# Patient Record
Sex: Male | Born: 1950 | Race: White | Hispanic: No | Marital: Married | Smoking: Former smoker
Health system: Southern US, Community
[De-identification: ages and names within clinical notes are randomized; demographics above are authoritative.]

## PROBLEM LIST (undated history)

## (undated) DIAGNOSIS — I10 Essential (primary) hypertension: Secondary | ICD-10-CM

## (undated) DIAGNOSIS — I2510 Atherosclerotic heart disease of native coronary artery without angina pectoris: Secondary | ICD-10-CM

## (undated) DIAGNOSIS — I4891 Unspecified atrial fibrillation: Secondary | ICD-10-CM

## (undated) DIAGNOSIS — I3489 Other nonrheumatic mitral valve disorders: Secondary | ICD-10-CM

## (undated) DIAGNOSIS — Z952 Presence of prosthetic heart valve: Secondary | ICD-10-CM

## (undated) DIAGNOSIS — I348 Other nonrheumatic mitral valve disorders: Secondary | ICD-10-CM

## (undated) DIAGNOSIS — I340 Nonrheumatic mitral (valve) insufficiency: Secondary | ICD-10-CM

## (undated) DIAGNOSIS — I059 Rheumatic mitral valve disease, unspecified: Secondary | ICD-10-CM

## (undated) DIAGNOSIS — E785 Hyperlipidemia, unspecified: Secondary | ICD-10-CM

## (undated) DIAGNOSIS — M7989 Other specified soft tissue disorders: Secondary | ICD-10-CM

## (undated) DIAGNOSIS — E119 Type 2 diabetes mellitus without complications: Secondary | ICD-10-CM

## (undated) DIAGNOSIS — R5383 Other fatigue: Secondary | ICD-10-CM

## (undated) HISTORY — DX: Unspecified atrial fibrillation: I48.91

## (undated) HISTORY — DX: Other nonrheumatic mitral valve disorders: I34.89

## (undated) HISTORY — DX: Nonrheumatic mitral (valve) insufficiency: I34.0

## (undated) HISTORY — DX: Atherosclerotic heart disease of native coronary artery without angina pectoris: I25.10

## (undated) HISTORY — DX: Other specified soft tissue disorders: M79.89

## (undated) HISTORY — DX: Other fatigue: R53.83

## (undated) HISTORY — DX: Hyperlipidemia, unspecified: E78.5

## (undated) HISTORY — DX: Other nonrheumatic mitral valve disorders: I34.8

## (undated) HISTORY — DX: Presence of prosthetic heart valve: Z95.2

## (undated) HISTORY — DX: Essential (primary) hypertension: I10

## (undated) HISTORY — DX: Rheumatic mitral valve disease, unspecified: I05.9

## (undated) HISTORY — DX: Type 2 diabetes mellitus without complications: E11.9

---
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
## 1978-12-22 HISTORY — PX: OTHER SURGICAL HISTORY: SHX169

## 2002-05-23 ENCOUNTER — Ambulatory Visit (HOSPITAL_COMMUNITY): Admission: RE | Admit: 2002-05-23 | Discharge: 2002-05-23 | Payer: Self-pay | Admitting: Pulmonary Disease

## 2002-12-22 HISTORY — PX: SALIVARY GLAND SURGERY: SHX768

## 2003-11-06 ENCOUNTER — Ambulatory Visit (HOSPITAL_BASED_OUTPATIENT_CLINIC_OR_DEPARTMENT_OTHER): Admission: RE | Admit: 2003-11-06 | Discharge: 2003-11-06 | Payer: Self-pay | Admitting: Otolaryngology

## 2003-11-06 ENCOUNTER — Encounter (INDEPENDENT_AMBULATORY_CARE_PROVIDER_SITE_OTHER): Payer: Self-pay | Admitting: Specialist

## 2003-11-06 ENCOUNTER — Ambulatory Visit (HOSPITAL_COMMUNITY): Admission: RE | Admit: 2003-11-06 | Discharge: 2003-11-06 | Payer: Self-pay | Admitting: Otolaryngology

## 2004-11-13 ENCOUNTER — Emergency Department (HOSPITAL_COMMUNITY): Admission: EM | Admit: 2004-11-13 | Discharge: 2004-11-13 | Payer: Self-pay | Admitting: Emergency Medicine

## 2004-11-24 ENCOUNTER — Emergency Department (HOSPITAL_COMMUNITY): Admission: EM | Admit: 2004-11-24 | Discharge: 2004-11-24 | Payer: Self-pay | Admitting: Emergency Medicine

## 2004-11-27 ENCOUNTER — Emergency Department (HOSPITAL_COMMUNITY): Admission: EM | Admit: 2004-11-27 | Discharge: 2004-11-27 | Payer: Self-pay | Admitting: Emergency Medicine

## 2004-12-02 ENCOUNTER — Inpatient Hospital Stay (HOSPITAL_COMMUNITY): Admission: AD | Admit: 2004-12-02 | Discharge: 2004-12-04 | Payer: Self-pay | Admitting: *Deleted

## 2004-12-02 HISTORY — PX: RADIOFREQUENCY ABLATION: SHX2290

## 2004-12-03 ENCOUNTER — Encounter (INDEPENDENT_AMBULATORY_CARE_PROVIDER_SITE_OTHER): Payer: Self-pay | Admitting: *Deleted

## 2004-12-03 HISTORY — PX: CARDIAC CATHETERIZATION: SHX172

## 2004-12-11 ENCOUNTER — Encounter (INDEPENDENT_AMBULATORY_CARE_PROVIDER_SITE_OTHER): Payer: Self-pay | Admitting: *Deleted

## 2004-12-11 ENCOUNTER — Inpatient Hospital Stay (HOSPITAL_COMMUNITY)
Admission: RE | Admit: 2004-12-11 | Discharge: 2004-12-15 | Payer: Self-pay | Admitting: Thoracic Surgery (Cardiothoracic Vascular Surgery)

## 2004-12-11 DIAGNOSIS — Z952 Presence of prosthetic heart valve: Secondary | ICD-10-CM

## 2004-12-11 HISTORY — PX: MITRAL VALVE SURGERY: SHX714

## 2004-12-11 HISTORY — DX: Presence of prosthetic heart valve: Z95.2

## 2005-01-13 ENCOUNTER — Encounter
Admission: RE | Admit: 2005-01-13 | Discharge: 2005-01-13 | Payer: Self-pay | Admitting: Thoracic Surgery (Cardiothoracic Vascular Surgery)

## 2005-01-17 ENCOUNTER — Ambulatory Visit (HOSPITAL_COMMUNITY): Admission: RE | Admit: 2005-01-17 | Discharge: 2005-01-17 | Payer: Self-pay | Admitting: *Deleted

## 2005-03-17 ENCOUNTER — Encounter: Admission: RE | Admit: 2005-03-17 | Discharge: 2005-03-17 | Payer: Self-pay | Admitting: Neurology

## 2005-11-21 ENCOUNTER — Ambulatory Visit (HOSPITAL_COMMUNITY): Admission: RE | Admit: 2005-11-21 | Discharge: 2005-11-21 | Payer: Self-pay | Admitting: Pulmonary Disease

## 2006-01-16 ENCOUNTER — Inpatient Hospital Stay (HOSPITAL_COMMUNITY): Admission: EM | Admit: 2006-01-16 | Discharge: 2006-01-20 | Payer: Self-pay | Admitting: Emergency Medicine

## 2006-01-19 HISTORY — PX: CARDIAC CATHETERIZATION: SHX172

## 2010-06-03 ENCOUNTER — Ambulatory Visit (HOSPITAL_COMMUNITY): Admission: RE | Admit: 2010-06-03 | Discharge: 2010-06-03 | Payer: Self-pay | Admitting: Cardiology

## 2010-06-06 ENCOUNTER — Ambulatory Visit (HOSPITAL_COMMUNITY)
Admission: RE | Admit: 2010-06-06 | Discharge: 2010-06-06 | Payer: Self-pay | Source: Home / Self Care | Admitting: Cardiology

## 2010-06-06 HISTORY — PX: CARDIAC CATHETERIZATION: SHX172

## 2011-05-09 NOTE — Cardiovascular Report (Signed)
NAME:  Jacob [name], CUSH NO.:  [id number]   MEDICAL RECORD NO.:  [id number]          PATIENT TYPE:  INP   LOCATION:  2013                         FACILITY:  [facility]   PHYSICIAN:  Nanetta Batty, M.D.   DATE OF BIRTH:  [birth year]   DATE OF PROCEDURE:  DATE OF DISCHARGE:                              CARDIAC CATHETERIZATION   INDICATION:  Mr. 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  He had AF ablation followed by MR repair with  Carpentier ring.  He has been on Coumadin anticoagulation.  He presented on  January 16, 2006 with chest pain consistent with unstable angina.  He ruled  out for myocardial infarction.  There were no acute EKG changes.  He  presents now with an INR of 1.4, for diagnostic coronary arteriography to  rule out an ischemic etiology.   PROCEDURE DESCRIPTION:  The patient was brought to the second floor [facility] in the postabsorptive state.  He was premedicated with  p.o. Valium.  His right groin was prepped and draped in the usual sterile  fashion.  Xylocaine 1% was used for local anesthesia.  A 6-French sheath was  inserted into the right femoral artery using standard Seldinger technique.  The 6-French right and left Judkins diagnostic catheters as well as a 6-  French pigtail catheter were used for selective coronary angiography, and  left ventriculography, respectively.  Visipaque dye was used for the  entirety of the case.  Retrograde aortic, left ventricular and pullback  pressures were recorded.   HEMODYNAMICS:  __________  3, end-diastolic pressure of 15.   SELECTIVE CORONARY ANGIOGRAPHY:  1.  Left main normal.  2.  LAD normal.  3.  Left circumflex was nondominant and normal.  4.  Right coronary artery was dominant and normal.   LEFT VENTRICULOGRAPHY:  RAO left ventriculogram was performed using 25 mL of  Visipaque dye at 12 mL/sec.  The overall LVEF was estimated at greater  than  60% without focal wall motion abnormalities.   Fluoroscopy of the chest revealed the mitral annular ring in place and  intact.   IMPRESSION:  Mr. Crooke has normal coronary arteries and normal left  ventricular function.  I do not think the etiology of his chest pain is  cardiac in nature.  Heparin will be restarted on 4 hours.  Our pharmacy will  dose his Coumadin beginning tonight.  He will be discharged home, once his  INR reaches __________ .  He may be a candidate for Lovenox bridging.      Nanetta Batty, M.D.  Electronically Signed     JB/MEDQ  D:  01/19/2006  T:  01/20/2006  Job:  [id number]   cc:   [facility] Second Floor Cardiac Cath Lab   [address], [place], [place] [address] [facility]   Oneal Deputy. Juanetta Gosling, M.D.  Fax: [phone]

## 2011-05-09 NOTE — Procedures (Signed)
NAME:  Jacob [name], Jacob [name] NO.:  [id number]   MEDICAL RECORD NO.:  [id number]          PATIENT TYPE:  EMS   LOCATION:  ED                            FACILITY:  [facility]   PHYSICIAN:  Edward L. Juanetta Gosling, M.D.DATE OF BIRTH:  28-Mar-1951   DATE OF PROCEDURE:  11/13/2004  DATE OF DISCHARGE:  11/13/2004                                EKG INTERPRETATION   DATE/TIME: (FIRST):  Rhythm strip from November 13, 2004 at 10:42.   EKG INTERPRETATION:  Rhythm is sinus arrhythmia.   DATE/TIME: (SECOND):  An EKG at 10:42 on November 13, 2004.   INTERPRETATION:  Rhythm is sinus arrhythmia with premature atrial  contractions and there is left atrial enlargement.   DATE/TIME: (THIRD):  At 10:33 on November 13, 2004.   INTERPRETATION:  The rhythm is a supraventricular tachycardia with a rate of  about 170. There are lateral ST-T wave abnormalities.   DATE/TIME: (FOURTH):  At 10:36 on November 13, 2004.   INTERPRETATION:  The rhythm is a sinus rhythm with a rate in the 90's. There  are premature atrial contractions. Abnormal electrocardiogram.   DATE/TIME: (FIFTH):  A rhythm strip from 10:59.   INTERPRETATION:  The rhythm is sinus rhythm with premature ventricular  contractions.     Edwa   ELH/MEDQ  D:  11/24/2004  T:  11/25/2004  Job:  [id number]

## 2011-05-09 NOTE — Cardiovascular Report (Signed)
NAME:  Jacob [name], Jacob [name] NO.:  [id number]   MEDICAL RECORD NO.:  [id number]          PATIENT TYPE:  OIB   LOCATION:  6525                         FACILITY:  [facility]   PHYSICIAN:  Darlin Priestly, MD  DATE OF BIRTH:  Mar 31, 1951   DATE OF PROCEDURE:  12/03/2004  DATE OF DISCHARGE:                              CARDIAC CATHETERIZATION   PROCEDURES:  1.  Right heart catheterization.  2.  Left heart catheterization.  3.  Coronary angiography.  4.  Left ventriculogram.   COMPLICATIONS:  None.   INDICATIONS:  Mr. Malmstrom is a 60 year old male patient of Dr. Kari Baars  and Dr. Launa Grill.  Initially referred to Dr. Severiano Gilbert for intermittent  palpitations.  He underwent a 2D echocardiogram revealing severe mitral  regurge with normal LV function.  He was seen by Dr. Severiano Gilbert and underwent SVT  ablation on December 02, 2004.  He is now brought for right and left heart  catheterization to evaluate his coronary status and right heart pressures  for possible mitral valve repair.   DESCRIPTION OF OPERATION:  After informed written consent, the patient was  brought to the cardiac catheterization lab.  Right and left groins were  shaved, prepped, and draped in the usual sterile fashion.  ECG monitoring  was established.  Using modified Seldinger technique, an #8 French venous  sheath was inserted into the right femoral vein, and a #6 [place] arterial  sheath in the right femoral artery.  Next, under fluoroscopic guidance, a #7  [place] Swan-Ganz catheter was deployed into the RA, RV, PA, and wedge  positions, and hemodynamic measurements were made.  #6 [place] diagnostic  catheter was used to perform diagnostic angiography.   The left main is a medium to large size vessel, with 20% ostial narrowing.   The LAD is a large vessel that courses around the apex and gives rise to two  diagonal branches.  The LAD is mildly irregular but has no high-grade  stenosis.   The first diagonal  is a small vessel with 80% ostial disease.  It is small,  with diffuse disease of the vessel.  The second diagonal is a medium size  vessel which bifurcates distally, with no significant disease.   The left circumflex is a medium size vessel which courses around the apex  and gives rise to two obtuse marginal branches.  The circumflex has a mild  40% proximal narrowing.  First OM is a medium size vessel which bifurcates  distally, with no significant disease.  The second OM is a small vessel with  no significant disease.   The right coronary artery is a large vessel which is dominant and gives rise  to a PDA as well as a posterolateral branch.  The RCA has diffuse proximal  and mid 20-30% narrowing, but no high-grade stenosis.  PDA and  posterolateral branch are large vessels with no significant disease.   Left ventriculogram reveals preserved EF at 60%.  There is severe mitral  regurge noted, with a large left atrium.   Hemodynamics:  Right atrial pressure 6, RV 63/6, PA 66/31,  pulmonary  capillary wedge pressure 29, systemic arterial pressure 128/81, left  ventricular pressure 124/11, LVEDP 25, cardiac output 4.7, cardiac index  2.2, PA saturation 64%, AO saturation 93%.   CONCLUSIONS:  1.  Significant one-vessel CAD involving a small first diagonal branch.  2.  Normal LV systolic function, with severe mitral regurge and a large left      atrium.  3.  Moderate pulmonary hypertension, with elevated pulmonary capillary wedge      pressure.  4.  Elevated LVEDP.  5.  Cardiac output 4.7, cardiac index 2.2.  6.  PA saturation 64%, AO saturation 93%.      Robe   RHM/MEDQ  D:  12/03/2004  T:  12/03/2004  Job:  [id number]   cc:   Janeece Riggers. Severiano Gilbert, M.D.  Fax: [phone]   Oneal Deputy. Juanetta Gosling, M.D.  [address]  [place]  [place] [address]  Fax: [phone]

## 2011-05-09 NOTE — Op Note (Signed)
NAME:  Jacob [name], Jacob [name] NO.:  [id number]   MEDICAL RECORD NO.:  [id number]          PATIENT TYPE:  INP   LOCATION:  2309                         FACILITY:  [facility]   PHYSICIAN:  Salvatore Decent. Cornelius Moras, M.D. DATE OF BIRTH:  1951/08/24   DATE OF PROCEDURE:  12/11/2004  DATE OF DISCHARGE:                                 OPERATIVE REPORT   PREOPERATIVE DIAGNOSES:  1.  Mitral valve prolapse with severe mitral regurgitation.  2.  Patent foramen ovale.   POSTOPERATIVE DIAGNOSES:  1.  Mitral valve prolapse with severe mitral regurgitation.  2.  Patent foramen ovale.   PROCEDURE:  Median sternotomy for mitral valve repair (quadrangular  resection of the posterior leaflet, sliding leaflet annuloplasty with 28 mm  Edwards physio angioplasty ring) and closure of patent foramen ovale with  oversewing of left atrial appendage.   SURGEON:  Salvatore Decent. Cornelius Moras, M.D.   ASSISTANT:  Salvatore Decent. Dorris Fetch, M.D.   ANESTHESIA:  General anesthesia.   INDICATIONS FOR PROCEDURE:  The patient is a 60 year old gentleman from  [place], [place], with fairly unremarkable past medical history who was  followed by Dr. Shaune Pollack.  He presents with a six go 12 month history of  worsening exertional shortness of breath and fatigue.  Recently he developed  episodes of tachy palpitations which were found to be recurrent  supraventricular tachycardia.  He was referred to Dr. Launa Grill at  [facility].  He subsequently underwent EP study  and radiofrequency ablation of an accessory pathway in the right atrium felt  to be culprit of the patient's underlying paroxysmal supraventricular  tachycardia.  He was also noted to have evidence of severe mitral  regurgitation and follow-up transesophageal echocardiogram confirms the  presence of prolapse involving the middle scallop of the posterior leaflet  of mitral valve with severe (4+) mitral regurgitation.  There is a small  patent foramen ovale.  Left ventricular function is normal.  Left and right  heart catheterization demonstrates insignificant coronary artery disease.  There is moderate pulmonary hypertension.  A full consultation note has been  dictated previously.   OPERATIVE CONSENT:  The patient and his wife have been counseled at length  regarding the indications and potential benefits of surgical intervention.  Alternative strategies have been discussed.  They understand and accept all  associated risks of surgery and desire to proceed as described.   DESCRIPTION OF PROCEDURE:  The patient was brought to the operating room on  the above mentioned date and placed in the supine position on the operating  table.  Central monitoring is established by the anesthesia service under  the care and direction of Dr. Lacretia Nicks. Autumn Patty.  Specifically, a Swan-  Ganz catheter was placed through the right internal jugular approach.  A  radial arterial line is placed.  IV antibiotics are administered.  Following  induction with general endotracheal anesthesia, a Foley catheter is placed.  The patient's chest, abdomen, both groins and both lower extremities are  prepared and draped in a sterile manner.   Baseline transesophageal echocardiogram is performed by Dr.  Fitzgerald.  This confirms the presence of prolapse involving the middle scallop of the  posterior leaflet of the mitral valve.  This segment of the valve appears to  be flail with evidence of several ruptured primary cords.  There is severe  (4+) mitral regurgitation.  There is a small patent foramen ovale.  Left  ventricular function appears normal.  No other abnormalities are noted,  although the left atrium is quite enlarged.   A median sternotomy incision is performed.  The patient is heparinized  systemically.  The pericardium is opened.  The ascending aorta is normal in  appearance.  There is significant right ventricular dilatation and  biatrial  enlargement with mild to moderate left ventricular dilatation.  The  ascending aorta is cannulated for cardiopulmonary bypass.  The venous  cannula is placed directly in the superior vena cava.  A second venous  cannula is placed low in the right atrium with tip extending down the  inferior vena cava.  A retrograde cardioplegia catheter is placed through  the right atrium into the coronary sinus.   Cardiopulmonary bypass is begun.  Vesi-loops are placed around the superior  vena cava and the inferior vena cava.  Antegrade cardioplegia catheter is  placed in the ascending aorta.  A temperature probe is placed in the left  ventricular septum.   The patient is cooled to 28 degrees systemic temperature.  The aortic  crossclamp was applied and cardioplegia is delivered initially in the  antegrade fashion through the aortic root.  Ice saline slush is applied for  topical hypothermia.  Supplemental cardioplegia is administered retrograde  to the coronary sinus catheter.  The initial cardioplegic arrest and  myocardial cooling are felt to be excellent.  Repeat doses of cardioplegia  are administered intermittently throughout the crossclamp portion of the  operation retrograde through the coronary sinus catheter to maintain septal  temperature below 50 degrees C.   The left atriotomy incision is performed posteriorly through the intra-  atrial groove.  The mitral valve is exposed using a self-retaining  retractor.  Exposure is felt to be excellent.  The mitral valve was  carefully examined.  There was an obvious flail segment of the middle  scallop (P2) of the posterior leaflet of the mitral valve.  All of the  primary cords in the middle portion of the scallop are ruptured and flail.  The anterior leaflet appears normal and there is no prolapse involving the anterior leaflet.  The P1 portion of the posterior leaflet is normal.  The  P3 portion of the posterior leaflet is slightly  small but otherwise normal.  No other significant abnormalities are appreciated.   Quadrangular resection of the middle scallop (P2) of the posterior leaflet  is performed.  Resected specimen of leaflet is sent to pathology for routine  evaluation.  The remaining portions of the posterior leaflet (P1 and P3) are  both mobilized off of the posterior mitral annulus.  This is performed more  so on the P1 portion than the P3 portion due to the small size of P3.  Sliding leaflet annuloplasty is thus performed. Several interrupted figure-  of-eight sutures are placed through the posterior mitral annulus after  mobilization of the P1 and P3 portions of posterior leaflet to shorten the  posterior leaflet circumference.  After these so-called compression sutures  are placed, the posterior leaflet is reattached to the posterior mitral  annulus using two layer closure of running 4-0 Ethibond suture.  The  remaining vertical defect in the posterior leaflet is then closed using  interrupted 5-0 Ethibond everting sutures.  The valve was briefly tested and  appears to be competent.   Ring annuloplasty is performed using interrupted 2-0 Ethibond horizontal  mattress sutures placed circumferentially around the entire mitral annulus.  The mitral valve is sized to accept a 28 mm annuloplasty ring.  An Edwards  [employer] physio ring (model #4450, serial [id number]) is secured in place  uneventfully.  After completion of the mitral valve repair, the valve was  again tested by instilling ice saline into the left ventricular chamber.  The valve appears to be functioning normally with no residual mitral  regurgitation.  There is an isometrical line of closure of the leaflets.  Rewarming is begun.   The left atrial appendage is oversewn from within the left atrium using a  two-layer closure of running 3-0 Prolene suture.  The left atriotomy  incision is now closed using a two-layer closure of running 3-0  Prolene  suture.  Just prior to completion of the closure of the atriotomy, the lungs  are briefly ventilated to evacuate any residual air from the pulmonary  veins.  An oblique right atriotomy incision is performed.  The intra-atrial  septum and fossa ovalis are exposed.  One can appreciate a small patent  foramen ovale in the usual location.  This is oversewn with single 4-0  Prolene suture.  The right atriotomy incision is now closed with two-layer  closure of running 4-0 Prolene suture.   The patient is placed in Trendelenburg position.  One final dose of warm  retrograde hot shot cardioplegia is administered.  The lungs are ventilated  and the heart allowed to fill briefly to allow evacuation of any residual  air through the aortic root.  The aortic crossclamp is removed after a total  crossclamp time of 104 minutes.  The retrograde cardioplegia catheter is removed.  The heart begins to beat  spontaneously without need for cardioversion.  The atriotomy incisions are  inspected for hemostasis.  Epicardial pacing wires are fixed to the right  ventricular outflow tract into the right atrial appendage.  The patient is  rewarmed for 37 degrees C temperature.  Throughout this portion of the  procedure, the heart is vented through the aortic root.  The IVC cannula is  subsequently removed and its cannulation site oversewn with Prolene suture.  After transesophageal echocardiogram verifies the absence of any residual  air, the aortic root vent is removed.   The patient is weaned from cardiopulmonary bypass without difficulty.  The  patient's rhythm at separation from bypass is normal sinus rhythm.  The  patient is weaned from bypass on dopamine at 3 mcg/kg/minute.  Total  cardiopulmonary bypass time for the operation is 130 minutes.  Follow-up  transesophageal echocardiogram performed by Dr. Sampson Goon after separation  from bypass demonstrates preserved left ventricular function.  There  is a  well seated mitral annuloplasty ring.  The mitral valve appears to be  functioning normally with no residual mitral regurgitation.  No other  abnormalities are noted.  There is no residual air.   The SVC cannula is removed and its cannulation site oversewn.  The aortic  cannula is removed.  Protamine is administered to reverse the  anticoagulation.  The mediastinum is irrigated with saline solution  containing vancomycin.  Meticulous surgical hemostasis ascertained.   The mediastinum and the right pleural space drained using three chest tubes  exited through separate stab  incisions inferiorly.  The median sternotomy is  closed in routine fashion.  The soft tissue to the sternum are closed in  multiple layers and the skin is closed with running subcuticular skin  closure.   The patient tolerated the procedure well and is transported to the surgical  intensive care unit in stable condition.  There are no intraoperative  complications.  All sponge, needle and instrument counts are verified  correct at completion of the operation.  No blood products were  administered.      Clar   CHO/MEDQ  D:  12/11/2004  T:  12/12/2004  Job:  [id number]   cc:   Janeece Riggers. Severiano Gilbert, M.D.  Fax: [phone]   Darlin Priestly, MD  [phone] [address], [address]  [place]  [place] [address]  Fax: [phone]   Oneal Deputy. Juanetta Gosling, M.D.  [address]  [place]  [place] [address]  Fax: [phone]

## 2011-05-09 NOTE — Op Note (Signed)
NAME:  Jacob [name], Jacob [name] NO.:  [id number]   MEDICAL RECORD NO.:  [id number]          PATIENT TYPE:  INP   LOCATION:  2033                         FACILITY:  [facility]   PHYSICIAN:  Zenon Mayo, MDDATE OF BIRTH:  1951/08/27   DATE OF PROCEDURE:  12/11/2004  DATE OF DISCHARGE:                                 OPERATIVE REPORT   PROCEDURE:  Intraoperative transesophageal echocardiogram.   OPERATOR:  Zenon Mayo, M.D.   INDICATIONS FOR PROCEDURE:  Evaluation of mitral valve.   INDICATIONS FOR PROCEDURE:  The patient is an otherwise healthy 60 year old  gentleman who had been having progressive worsening of shortness of breath  on exertion.  He was found to have mitral regurgitation by echocardiogram,  with a flail leaflet, and was scheduled for repair of the valve by Dr.  Purcell Nails.   DESCRIPTION OF PROCEDURE:  The patient was brought to the operating room and  placed under general anesthesia.  After a confirmation of endotracheal tube  placement, the transesophageal echocardiogram probe was placed  atraumatically.  The left ventricle was imaged initially.  There were no  wall motion abnormalities seen.  The ventricle appeared normal in thickness.  The estimated ejection fraction was 60%.  The mitral valve was then imaged.  There appeared to be a flail segment on the posterior leaflet.  There did  not appear to be any chordae or flailing back into the left atrium.  When  color Doppler was placed across the valve, an eccentric jet of severe mitral  regurgitation was noted.  The jet moved across the anterior leaflet and  wrapped around the entire left atrium.  There was reversal of flow noted in  the pulmonary veins, both by color and by continuous wave Doppler.  Upon  evaluation of the mitral regurgitation jet into the left atrium, it appeared  that some of that jet was leaking across into the right atrium.  It was felt  that this was secondary to  a patent foramen ovale with left to right flow,  secondary to increased left atrial pressures.  The aorta was imaged and  appeared grossly normal.  There were no vegetations or calcifications seen  on the valve.  The valve is tri-leaflet in nature and appeared to open well.  There was no aortic insufficiency noted.  The tricuspid valve also revealed  a normal function, with no tricuspid regurgitation seen.  The pulmonic valve  was easily seen as well, with trace amounts of pulmonic regurgitation noted,  probably secondary to pulmonary artery catheter.  The thoracic aorta was  visualized without any evidence of atherosclerosis.  At the conclusion of bypass, the mitral valve was again imaged.  The ring  appeared to be seated in a good position, and the leaflets continued to move  well.  There was no regurgitation seen at any point after cardiopulmonary  bypass.  There was no color flow across the inner atrial septum, and so the  repair of the patent foramen ovale appeared good.  The left ventricular  function was well-preserved and did not require the assistance  of any  inotropes.  The remainder  of the heart examination was without change from the pre-bypass period.  At  the conclusion of the procedure, the echocardiogram probe was removed,  without any evidence of trauma.  The patient was taken from the operating room to the intensive care unit in  a stable fashion.       WEF/MEDQ  D:  12/11/2004  T:  12/12/2004  Job:  [id number]   cc:   Anesthesia Office

## 2011-05-09 NOTE — Consult Note (Signed)
NAME:  Jacob [name], Jacob [name] NO.:  [id number]   MEDICAL RECORD NO.:  [id number]          PATIENT TYPE:  OIB   LOCATION:  2899                         FACILITY:  [facility]   PHYSICIAN:  Janeece Riggers. Severiano Gilbert, M.D.    DATE OF BIRTH:  12-29-1950   DATE OF CONSULTATION:  DATE OF DISCHARGE:  01/17/2005                                   CONSULTATION   No dictation for this job number.      MEP/MEDQ  D:  01/17/2005  T:  01/17/2005  Job:  [id number]

## 2011-05-09 NOTE — Discharge Summary (Signed)
NAME:  Jacob [name], Jacob [name] NO.:  [id number]   MEDICAL RECORD NO.:  [id number]          PATIENT TYPE:  INP   LOCATION:  2033                         FACILITY:  [facility]   PHYSICIAN:  Salvatore Decent. Cornelius Moras, M.D. DATE OF BIRTH:  [birth year]   DATE OF ADMISSION:  12/11/2004  DATE OF DISCHARGE:  12/15/2004                                 DISCHARGE SUMMARY   ADMISSION DIAGNOSES:  1.  Mitral valve prolapse with severe mitral regurgitation.  2.  Patent foramen ovale.   DISCHARGE/SECONDARY DIAGNOSES:  1.  Mitral valve prolapse with severe mitral regurgitation status post      repair.  2.  Patent foramen ovale status post closure.  3.  History of recurrent paroxysmal supraventricular tachycardia.  4.  Seasonal allergies.  5.  Hemorrhoids.  6.  Tonsillectomy.  7.  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  8.  Excision of right ganglion cyst.  9.  No internal carotid artery stenosis bilateral per carotid duplex on      December 03, 2004.  10. Normal ankle brachial indices and Doppler waveforms per lower extremity      Doppler study on December 03, 2004.  Bilateral ankle brachial indices      were greater than 1.  11. He is status post electrophysiologic study and radiofrequency ablation      of right-sided pathway for paroxysmal supraventricular tachycardia on      December 02, 2004.   PROCEDURES:  1.  Mediastinotomy for mitral valve repair (quadrangular resection of the      posterior leaflet, sliding leaflet annuloplasty with a 28 mm Edwards      Physio annuloplasty ring) and closure of patent foramen ovale with      oversewing of the left atrial appendage, surgeon Dr. Tressie Stalker.      Date was December 11, 2004.  2.  Also on December 11, 2004, intraoperative transesophageal echocardiogram      by Dr. Autumn Patty.  Post bypass, the ring appeared to be seated      in a good position and the leaflets continued to move well.  There was      no  regurgitation noted.   ALLERGIES:  He is allergic to [facility] which may have caused him to develop  minor rash and diarrhea.   BRIEF HISTORY:  Jacob [name] is a 60 year old Caucasian male from [place], [place], who in the past has had a fairly unremarkable Past Medical  History.  He is followed by Dr. Shaune Pollack in [place].  [name]  reported that over the last 6-12 months he developed worsening exertional  shortness of breath and fatigue.  This had gotten to be quite significant  such that he got shortness of breath with relatively mild physical activity.  He denied any resting shortness of breath, PND, orthopnea, or chest pain.  He also has had mild bilateral extremity edema.  Several weeks prior to  admission he developed a new onset of tacky palpitations which on 3 separate  occasions prompted him to present to the emergency room where he was  found  to be in the same supraventricular tachycardia with findings consistent with  recurrent paroxysmal supraventricular tachycardia.  He was subsequently  referred to Dr. Severiano Gilbert at [facility] for an EP  study and a radiofrequency ablation.  At that visit, he was noted to have a  murmurs on physical exam.  An echocardiogram was performed demonstrating the  presence of mitral valve prolapse with severe mitral regurgitation.  Mr.  Gilpatrick was subsequently brought to the hospital where he underwent an EP  study and ablation on December 02, 2004 for a correction of this recurrent  paroxysmal supraventricular tachycardia.  He subsequently underwent left and  right heart catheterization as well as transesophageal echocardiogram.  Findings included the presence of insignificant coronary artery disease,  normal left ventricular function, moderate pulmonary hypertension and severe  4+ mitral regurgitation with a prolapse involving the middle scallop (P2) of  the posterior leaflet of the mitral valve.  Cardiac surgical  consultation  was requested to consider elective mitral valve repair.  He was seen by Dr.  Barry Dienes on December 03, 2004.  After exam of the patient and review of his  echocardiogram and cardiac catheterization report, Dr. Cornelius Moras did feel that  elective repair of his mitral valve was the best treatment option.  Dr. Cornelius Moras  also discussed the possibility that if intraoperatively he was found to  require a valve replacement that he would recommend a mechanical prosthesis  in an effort to avoid the risk of valve degeneration and failure.  The  patient was in agreement with proceeding with mitral valve repair versus  replacement.  Since Mr. Korver was felt stable, it was determined that he  was appropriate for leave of absence from the hospital with plans to return  on December 11, 2004.   HOSPITAL COURSE:  On December 11, 2004, [name] was admitted to [facility] and did under mitral valve repair and closure of his patent  foramen ovale as discussed above.  Overall, he was felt to tolerate the  procedure well and was transferred to the surgical intensive care unit in  stable condition.  Later than evening he had been extubated and was  neurologically intact.  He was maintaining some sinus rhythm and chest tube  output remained low.  By the following morning, there were no significant  changes.  His chest tubes were discontinued.  He began to mobilize and  direct therapy was started for his volume excess.  Coumadin therapy was  started for his mitral valve repair and the annuloplasty ring.  It was felt  that he would require anticoagulation therapy for a total of 3 months.   On postoperative day #2, [name] had been transferred out to the floor.  Over the next several days, he made good progress.  Although he did remain  easily fatigued with dyspnea on exertion, he did report a significant improvement from his baseline.  He was able to ambulate in the hallways  independently.  He is  tolerating and oral diet and his bowel and bladder  function were working appropriately.  His pain was controlled on Tylenol.  He showed good response to diuretic therapy and his labs remained stable.  He maintained sinus rhythm and by postoperative day #4 was thought stable  for discharge home.  Prior to discharge, his systolic blood pressure was  ranging between 130 and 140 with the diastolic ranging between 70 and 90.  His heart rate was maintaining  sinus rhythm at the high 70s to 80s.  Based  on his elevated blood pressure, it was felt that he could tolerate and  increase in his Toprol from 12.5 mg twice daily to 25 mg twice daily.  He  had been weaned from supplemental oxygen and was saturating greater than  90%.  He was afebrile.  On exam, his heart had a regular rate and rhythm  with no murmur auscultated.  His lungs were clear.  His abdominal exam was  benign.  His extremities showed trace nonpitting edema.  His general  incision was healing well without signs of infection.  His INR was  subtherapeutic at 1.6; however, it was felt that since he had maintained  sinus rhythm throughout his hospital course that he would be stable for  discharge home with close followup of his INR within 48 hours.  Dr. Allyson Sabal  was in agreement with this decision.   Mr. Kolander was discharged home on postoperative day #4, December 15, 2004 in  stable condition.   RADIOLOGY:  Chest x-ray on December 23rd showed no pneumothorax with  interval mild right basilar atelectasis, mildly improved left lower lobe  atelectasis and stable cardiomegaly and a small left pleural effusion.   LABORATORY DATA:  Most recent labs prior to discharge showed a  PT at 17.1,  INR of 1.6.  Sodium 142, potassium 4.3, blood glucose 90, BUN 15, creatinine  0.9, calcium 9.1.  White blood count of 8.2, hemoglobin 11.5, hematocrit  32.9, platelet count 113.   DISCHARGE MEDICATIONS:  1.  Coated aspirin 81 mg 1 p.o. daily  2.   Lopressor 25 mg 1 p.o. b.i.d.  3.  Coumadin 5 mg tablets.  He is to take one 5 mg tablet on December 25th      and 1/2 tablet on December 26th, then as directed by Dr. Allyson Sabal based on      his INR results.  4.  Singulair 10 mg 1 p.o. daily as needed.  5.  Tylox 1-2 tablets p.o. q.4h. p.r.n. for moderate to severe pain or      Tylenol 325 mg 1-2 tablets p.o. q.4h. p.r.n. mild pain.   ACTIVITY:  He is instructed to avoid driving or heavy lifting more than 10  pounds.  He was encouraged to continued daily breathing and walking  exercises.   DIET:  He is to follow a low fat, low salt diet.   WOUND CARE:  He may shower starting December 16, 2004.  He is to clean his  incisions with mild soap and water and is to notify the [facility] office if he  develops fever greater than 101 or redness or drainage from his incision  site.  He is also instructed to weigh himself daily first thing in the morning and to notify either Dr. Cornelius Moras or Dr. Allyson Sabal if his weight increases  more than 2 pounds over 2 consecutive days or if he develops increasing  shortness of breath or lower extremity edema.   FOLLOWUP:  He is to call and schedule PT/INR blood work at [facility] lab in [place] on December 07, 2004.  He is to call and  schedule a 2-week followup with Dr. Allyson Sabal at the [facility] in [place].  He is to follow up with Dr. Cornelius Moras at the [facility]  office on Monday, January 23, at 2:30 p.m.  He is to have a chest x-ray.  I  was instructed to bring his  chest x-ray film with him to the appointment at  [facility].  He is to call sooner if needed.      Alli   AWZ/MEDQ  D:  12/15/2004  T:  12/16/2004  Job:  [id number]   cc:   Nanetta Batty, M.D.  Fax: [phone]   Delman Cheadle, MD  [address]  [address]  [place] [address]  Fax: [phone]   Janeece Riggers. Severiano Gilbert, M.D.  Fax: [phone]   Oneal Deputy. Juanetta Gosling, M.D.  [address]  [place]  [place] [address]  Fax: [phone]

## 2011-05-09 NOTE — Cardiovascular Report (Signed)
NAME:  [name], Jacob NO.:  [id number]   MEDICAL RECORD NO.:  [id number]          PATIENT TYPE:  OIB   LOCATION:  2899                         FACILITY:  [facility]   PHYSICIAN:  Janeece Riggers. Severiano Gilbert, M.D.    DATE OF BIRTH:  [birth year]   DATE OF PROCEDURE:  01/17/2005  DATE OF DISCHARGE:  01/17/2005                              CARDIAC CATHETERIZATION   PROCEDURE PERFORMED:  External direct current cardioversion.   PREPROCEDURE DIAGNOSIS:  Atrial flutter with rapid ventricular response.   POSTPROCEDURE DIAGNOSIS:  Sinus rhythm with frequent premature atrial  contractions and premature ventricular contractions.   OPERATOR:  Mark E. Severiano Gilbert, M.D.   COMPLICATIONS:  None.   PROCEDURE IN DETAIL:  The patient was brought to the short stay area in a  fasted state.  The patient had been anticoagulated for approximately 21 days  on Coumadin therapy with INR greater than 2. The patient was noted on 12-  lead electrocardiogram to be in atrial flutter with variable ventricular  response with heart rate greater than 200 beats per minute.  Preprocedural  potassium was 4.  The patient was placed in deep conscious sedation with 100  mg IV injection of Pentothal.  After appropriate achievement of deep  conscious sedation, a single 100-joule biphasic synchronized cardioversion  shock was delivered.  Atrial flutter was converted to sinus rhythm initially  with frequent PACs and PVCs, but after approximately 10 minutes the patient  developed sinus rhythm with infrequent ectopy.  The patient awoke from  sedation in approximately 5-10 minutes.  Neurologic exam 20-30 minutes post  cardioversion was nonfocal. Mental status was normal.  The patient was alert  and oriented x4.  The patient tolerated the procedure well and was  discharged home to followup in two weeks.      MEP/MEDQ  D:  01/17/2005  T:  01/17/2005  Job:  [id number]

## 2011-05-09 NOTE — Op Note (Signed)
NAME:  Jacob [name], Jacob [name]                        ACCOUNT NO.:  [id number]   MEDICAL RECORD NO.:  [id number]                   PATIENT TYPE:  AMB   LOCATION:  DSC                                  FACILITY:  [facility]   PHYSICIAN:  Jefry H. Pollyann Kennedy, M.D.                DATE OF BIRTH:  10-30-1951   DATE OF PROCEDURE:  11/06/2003  DATE OF DISCHARGE:                                 OPERATIVE REPORT   PREOPERATIVE DIAGNOSIS:  Right parotid mass.   POSTOPERATIVE DIAGNOSIS:  Right parotid mass.   PROCEDURE:  Right superficial parotidectomy with dissection of facial nerve.   SURGEON:  Jefry H. Pollyann Kennedy, M.D.   ASSISTANT:  Kathy Breach, M.D.   ANESTHESIA:  General endotracheal anesthesia was used.   COMPLICATIONS:  No complications.   ESTIMATED BLOOD LOSS:  20 mL.   FINDINGS:  Multi-lobulated, firm mass involving the superficial lobe of the  parotid gland on the right between the two main branches of the facial  nerve, with some extension into the deep lobe.  Facial nerve was intact  postoperatively.  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  The patient tolerated the  procedure well and was awakened, extubated and transferred to recovery in  stable condition.   HISTORY:  This is a 60 year old with a several-month history of an enlarging  mass in the right side of the face.  Risks, benefits, alternatives and  complication to the procedure were explained to the patient, who seemed to  understand and agreed to surgery.   PROCEDURE:  The patient was taken to the operating room and placed on the  operating table in a supine position.  After induction of general  endotracheal anesthesia, the right side of the face was prepped and draped  in a standard fashion.  A right preauricular incision with extension behind  the ear lobe, around the mastoid tip and down into the cervical crease about  four fingerbreadths below the angle of the  mandible was outlined with a  marking pen and then electrocautery was used to incise the skin.  The  lateral branch of the right auricular nerve was identified and dissected and  preserved.  The parotid gland was dissected forward off the  sternocleidomastoid muscle and off of the cartilaginous external auditory  canal.  The posterior belly of the digastric muscle was exposed and the  dissection continued down along the tympanomastoid suture line.  The main  trunk was identified and dissected out towards the pes.  At the pes, the two  main branches were splayed almost 180 degrees by the tumor.  Superior and  inferior branches of the nerve were all dissected using McCabe dissector,  bilateral cautery and sharp scissors.  The mass was dissected free of the  underlying masseter muscle.  The mass and the superficial lobe were removed  and sent for frozen  section evaluation.  Bipolar cautery was used to provide  hemostasis.  The nerve was stimulated with a focal stimulator at the  termination of the procedure and all branches stimulated at half  milliampere.  Wound was irrigated  with saline and closed in layers using 3-0 chromic on the deep layer and  running 5-0 nylon on the skin.  A 7-French round JP drain was left in the  wound and exited through the inferior aspect of the incision and secured in  place with nylon suture.  The patient was then awakened, extubated and  transferred to recovery in stable condition.                                               Jefry H. Pollyann Kennedy, M.D.    JHR/MEDQ  D:  11/06/2003  T:  11/06/2003  Job:  [id number]

## 2011-05-09 NOTE — H&P (Signed)
NAME:  Jacob [name] NO.:  [id number]   MEDICAL RECORD NO.:  [id number]           PATIENT TYPE:   LOCATION:                               FACILITY:  [facility]   PHYSICIAN:  Salvatore Decent. Cornelius Moras, M.D. DATE OF BIRTH:  28-Feb-1951   DATE OF ADMISSION:  12/11/2004  DATE OF DISCHARGE:                                HISTORY & PHYSICAL   PRESENTING CHIEF COMPLAINT:  Exertional shortness of breath and fatigue.   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 Shaune Pollack in [place]. Jacob [name] reports that over the last six  to twelve months he has developed worsening exertional shortness of breath  and fatigue. This has gotten to be quite significant such that he gets short  of breath with relatively mild physical activity. He denies any resting  shortness of breath, PND, orthopnea, or chest pain. He has had mild  bilateral lower extremity edema. Several weeks ago he also developed new  onset of tachypalpitations which on three separate occasions prompted him to  present to the emergency room where he was found to be in sustained  supraventricular tachycardia with findings consistent with recurrent  paroxysmal supraventricular tachycardia. He was subsequently referred to Dr.  Severiano Gilbert at [facility] for EP study and  radiofrequency ablation. He was noted to have a murmur on physical  examination. An echocardiogram was performed demonstrating the presence of  mitral valve prolapse with severe mitral regurgitation. Jacob [name] was  subsequently brought into the hospital where he underwent EP study and  ablation yesterday for correction of his recurrent paroxysmal  supraventricular tachycardia.  He subsequently underwent left and right  heart catheterization as well as transesophageal echocardiogram. Findings  include the present of insignificant  coronary artery disease, normal left  ventricular function, moderate pulmonary hypertension, and severe (4+)  mitral regurgitation with prolapse involving the middle scallop (P2) of the  posterior leaflet of the mitral valve. Cardiac surgical consultation has  been requested to consider elective mitral valve repair.   REVIEW OF SYSTEMS:  GENERAL: Jacob [name] complains of progressive exertional  fatigue. He has good appetite. He has been trying to lose some weight on a  diet and exercise regimen. RESPIRATORY: Notable for progressive exertional  shortness of breath. The patient also describes a one- to two-month history  of persistent dry nonproductive cough. Six or eight weeks ago he was treated  for an upper respiratory tract infection and these symptoms seem to have  resolved. He denies history of hemoptysis or wheezing.  CARDIAC: As  described previously. The patient denies any history of chest pain either  with activity or at rest.  The patient denies syncope. GASTROINTESTINAL:  Negative. The patient has good appetite. He has no difficulty swallowing. He  does have intermittent mild symptoms of irritable bowel with occasional  crampy abdominal pain, occasional diarrhea, and occasional constipation. The  patient also has intermittent slight issues with pain, hemorrhoids, although  these have never required formal  medical evaluation. MUSCULOSKELETAL:  Negative. NEUROLOGIC: Negative. HEENT: Negative. The patient sees his  dentist regularly and saw him most recently approximately 60 months ago.  He has no loose teeth, painful teeth, or ongoing dental problems.  PSYCHIATRIC:  Negative. HEMATOLOGIC: Negative.   PAST MEDICAL HISTORY:  1.  Recurrent paroxysmal supraventricular tachycardia.  2.  Seasonal allergies.  3.  Hemorrhoids.   PAST SURGICAL HISTORY:  1.  s/p EP study and radiofrequency ablation of right-sided pathway for PSVT      yesterday.  2.  Tonsillectomy.  3.  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  4.  Excision of right ganglion cyst.   MEDICATIONS PRIOR TO ADMISSION:  1.  Cardizem CD 240 mg daily.  2.  Singulair tablets as needed for seasonal allergies.   DRUG ALLERGIES:  CARDIZEM may have caused him to develop minor rash and  diarrhea.   SOCIAL HISTORY:  The patient is married and lives with his wife and 13-year-  old daughter in [place]. 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 He  has a remote history of tobacco use, although he quit smoking 20 years ago.  He denies history of significant alcohol consumption.   PHYSICAL EXAMINATION:  The patient is a well-appearing white male who  appears her stated age in no acute distress. He is currently normal sinus  rhythm with blood pressure of 150/90. HEENT exam is grossly unrevealing. The  neck is supple. There is no cervical or supraclavicular lymphadenopathy.  There is no jugular venous distention. No carotid bruits are noted.  Auscultation of the chest includes clear and symmetrical breath sounds  bilaterally. No wheezes or rhonchi demonstrated. Cardiovascular exam  includes a regular rate and rhythm. There is a loud grade 4/6 holosystolic  murmur heard all the precordium with radiation to the axilla and back. No  diastolic murmurs are noted. The abdomen is soft and nontender. Bowel sounds  are present. There are no palpable masses. The liver edge is not enlarged or  firm. Extremities are warm and well perfused. There is trace bilateral lower  extremity edema. Distal pulses are palpable in both lower legs at the ankle.  There is no sign of significant venous insufficiency. Rectal and GU exams  are both deferred. Neurologic examination is grossly nonfocal and  symmetrical throughout.   DIAGNOSTIC TESTS:  Cardiac catheterization and transesophageal echocardiogram are performed yesterday and today and are  both reviewed.  Transesophageal echocardiogram demonstrates the presence of mitral valve  prolapse with flailed segments of the middle scallop with posterior leaflet  (P2) with severe (4+) mitral regurgitation. The left atrium is enlarged. The  left ventricular function appears normal. The aortic valve appears normal.  There is mild tricuspid insufficiency. There is a tiny patent foraminovale  with left to right shunting. No other significant abnormalities are noted.   Cardiac catheterization performed today by Dr. Jenne Campus is reviewed. This  demonstrates the presence of insignificant coronary artery disease. There is  right dominant coronary circulation. There are luminal irregularities  without any significant flow limiting lesions. There is moderate pulmonary  hypertension with PA pressures measuring 66/31 and a pulmonary capillary  wedge pressure of 29. The patient's resting cardiac output is 4.7 liters per  minute corresponding to a cardiac index of 2.2. Central venous pressure is  6.   IMPRESSION:  Mitral valve prolapse with severe mitral regurgitation and a 6-  12 month history of worsening symptoms of congestive heart failure, now  functional class II or perhaps even close to class III. I believe that Jacob.  [name] would best be treated for elective mitral valve repair. Based upon  findings of his recent transesophageal echocardiogram, I feel there is a  relatively high likelihood that mitral valve repair will be technically  feasible.  Jacob [name] will remain at somewhat elevated risk for atrial  arrhythmias, although he has now undergone successful ablation of his right-  sided PSVT. To my knowledge he has no documented history of atrial  fibrillation, and as such I do not feel that concommitant maze procedure is  indicated.   PLAN:  I have outlined options at length with Jacob [name] and his wife.  Alternative treatment strategies have been discussed. Specifically the  relative  risks and benefits of continued medical therapy and observation  versus proceeding with elective surgical intervention have been discussed.  Surgical alternatives have also been reviewed including conventional open  approach versus the possibility of minimally invasive approach. All their  questions have been addressed. They understand and accept all the associated  risks of surgery including but limited to risk of death, stroke, myocardial  infarction, congestive heart failure, respiratory failure, pneumonia,  bleeding requiring blood transfusion, arrhythmia, heart block or bradycardia  requiring permanent pacemaker, possible need for valve replacement if repair  is not feasible, later delayed complications related to the patient's mitral  valve disease. They understand that if valve replacement is necessary that  we would recommend the use of the mechanical prosthesis in an effort to avoid the risk of valve degeneration and failure. The patient also  understands that this would require lifelong anticoagulation with Coumadin.  All questions have been addressed. We tentatively plan to proceed with  surgery on Wednesday, December 11, 2004.      Clar   CHO/MEDQ  D:  12/03/2004  T:  12/03/2004  Job:  [id number]   cc:   Janeece Riggers. Severiano Gilbert, M.D.  Fax: [phone]   Darlin Priestly, MD  [phone] [address], [address]  [place]  [place] [address]  Fax: [phone]   Oneal Deputy. Juanetta Gosling, M.D.  [address]  [place]  [place] [address]  Fax: [phone]

## 2011-05-09 NOTE — Cardiovascular Report (Signed)
NAME:  Jacob [name], Jacob [name]              ACCOUNT NO.:  [id number]   MEDICAL RECORD NO.:  [id number]          PATIENT TYPE:  OIB   LOCATION:  6525                         FACILITY:  [facility]   PHYSICIAN:  Mark E. Severiano Gilbert, M.D.    DATE OF BIRTH:  1951-11-12   DATE OF PROCEDURE:  12/02/2004  DATE OF DISCHARGE:                              CARDIAC CATHETERIZATION   PROCEDURES PERFORMED:  Electrophysiology study with arrhythmia induction and  radiofrequency ablation of supraventricular tachycardia.   PRE PROCEDURE DIAGNOSIS:  Paroxysmal supraventricular tachycardia .   POST PROCEDURE DIAGNOSIS:  Typical atrioventricular reentry tachycardia.   OPERATOR:  Launa Grill, M.D.   COMPLICATIONS:  None.   ESTIMATED BLOOD LOSS:  Less than 50 mL.   PROCEDURE IN DETAIL:  The patient was brought to the EP laboratory in a  fasted state.  Patient's groins were prepped and draped in usual manner.  Two 6-French Safe sheath systems were introduced in the left femoral vein  without difficulty using a thin wall needle technique.  An 8-French Daig SRO  and a 7-French short sheath were introduced using thin wall needle technique  into the right femoral vein.  Initially, a fixed curve Josephson quadripolar  catheter was positioned within the right ventricular apex, a fixed curve  Josephson quadrivolt quadripolar catheter was positioned within the high  right atrium.  A 7-French decapolar Biosense Webster catheter was positioned  within the coronary sinus and finally a hexapolar His catheter was  positioned in the His position.  Baseline measurements were performed which  demonstrated normal sinus rhythm, AH interval 92 milliseconds and HV  interval 46 milliseconds.  There was no evidence of ventricular  preexcitation.  After determining capture thresholds in the atrium and the  ventricle EP study was performed.  The retrograde blocked cycle length was  360 milliseconds.  There was evidence of a left-sided accessory  pathway,  blocked cycle length 450 milliseconds and retrograde activation of the  atrium appeared midline at cycle length shorter than 450 milliseconds.  The  effective  refractory period of the AV node at 600 milliseconds was noted to  be 370 milliseconds.  The antegrade AV node effective refractory period was  less than 200 milliseconds off a drive train of 161 milliseconds.  During  atrial premature testing it was very easy to demonstrate a slow pathway jump  and single echoes.  Atrial pacing with atrial couplets led to easily induce  a narrow complex tachycardia, cycle length 362 milliseconds with VA time  less than 50 milliseconds.  During one of the inductions there was a period  of 2:1 AV conduction excluding an accessory pathway mediated tachycardia.  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  The high right atrial  catheter was withdrawn and was placed with an EPT large curve 4 mm tip  ablation catheter which was positioned initially in posterior tricuspid  anular positions and finally into anterior tricuspid anular positions with  application of multiple series of short radiofrequency energy deliveries.  Along the tricuspid anulus at approximately 4:50 in the LAO projection  multiple short 20-second burns with heating  characteristics to 55 degrees  resulted in junctional rhythm.  After waiting approximately 25 minutes post  EP study was performed of the following findings.  The VA ERP was 350  milliseconds off a drive train of 045 milliseconds.  The blocked cycle  length was 340 milliseconds, both of which are unchanged from pre study.  The antegrade ERP of the AV node was 250 milliseconds off the drive train of  409 milliseconds.  There was no inducible tachycardia both with single and  double atrial prematures.   All catheters were removed.  All sheaths were removed and hemostasis  obtained by direct pressure.  Patient tolerated procedure well and returned  to the  holding area in stable hemodynamic condition.   ASSESSMENT:  1.  Typical atrioventricular nodal reentry tachycardia  2.  Successful slow pathway modification with loss of arrhythmia induction.   PLAN:  Telemetry overnight and continued evaluation for mitral regurgitation  in the morning.       MEP/MEDQ  D:  12/02/2004  T:  12/02/2004  Job:  [id number]

## 2011-10-21 ENCOUNTER — Encounter: Payer: Self-pay | Admitting: *Deleted

## 2011-10-21 ENCOUNTER — Emergency Department (HOSPITAL_COMMUNITY)
Admission: EM | Admit: 2011-10-21 | Discharge: 2011-10-21 | Disposition: A | Discharge status: Home / Self Care | Payer: BC Managed Care – PPO | Attending: Emergency Medicine | Admitting: Emergency Medicine

## 2011-10-21 DIAGNOSIS — H532 Diplopia: Secondary | ICD-10-CM | POA: Insufficient documentation

## 2011-10-21 DIAGNOSIS — I10 Essential (primary) hypertension: Secondary | ICD-10-CM | POA: Insufficient documentation

## 2011-10-21 DIAGNOSIS — Z7982 Long term (current) use of aspirin: Secondary | ICD-10-CM | POA: Insufficient documentation

## 2011-10-21 HISTORY — DX: Essential (primary) hypertension: I10

## 2011-10-21 NOTE — ED Notes (Signed)
Lasted app 3 sec.

## 2011-10-21 NOTE — ED Provider Notes (Signed)
History     CSN: [id number] Arrival date & time: 10/21/2011  5:07 AM   First MD Initiated Contact with Patient 10/21/11 [phone]      Chief Complaint  Patient presents with  . Diplopia     The history is provided by the patient and the spouse.   patient reports he got up this morning and was in the process for work when he had transient "double vision".  He reports seeing two of everything.  This symptom lasted approximately 3 seconds and then resolve spontaneously on its.  During this time he had no difficulty with speech or unilateral weakness.  He is reported to have no facial droop.  He reports resolution of his symptoms and now he is completely asymptomatic.  He reports similar episodes about 5 years ago where he would have transient episodes of double vision that would last 3-10 seconds and would spontaneously resolve.  At that time he was worked up with MRI scans and multiple other tests without pathology noted.  He reports a mild headache at this time but has had a headache over the past several days.  He denies nausea or vomiting or diarrhea.  He denies fevers or chills.  He does report a distant history of paroxysmal atrial fibrillation but has not been in A. fib with a long time.  He is not on Coumadin.  He's had no difficulty walking or speaking.  He did not cover one eye and therefore it is difficult to ascertain whether or not this is monocular or binocular diplopia.  Nothing worsened his symptoms.  Nothing improves his symptoms.  The symptoms were mild and they have since resolved  Past Medical History  Diagnosis Date  . Hypertension   . Gout     Past Surgical History  Procedure Date  . Cardiac surgery   . Mitral valve surgery     Family History  Problem Relation Age of Onset  . Cancer Mother   . Diabetes Father   . Cancer Father     History  Substance Use Topics  . Smoking status: Never Smoker   . Smokeless tobacco: Not on file  . Alcohol Use: No      Review  of Systems  All other systems reviewed and are negative.    Allergies  Review of patient's allergies indicates no known allergies.  Home Medications   Current Outpatient Rx  Name Route Sig Dispense Refill  . ASPIRIN 325 MG PO TABS Oral Take 325 mg by mouth daily.      Marland Kitchen CARVEDILOL 3.125 MG PO TABS Oral Take 3.125 mg by mouth 2 (two) times daily with a meal.      . FUROSEMIDE 40 MG PO TABS Oral Take 40 mg by mouth daily.      Marland Kitchen LISINOPRIL 5 MG PO TABS Oral Take 5 mg by mouth daily.        BP 146/97  Pulse 73  Temp 98.1 F (36.7 C)  Resp 18  SpO2 99%  Physical Exam  Nursing note and vitals reviewed. Constitutional: He is oriented to person, place, and time. He appears well-developed and well-nourished.  HENT:  Head: Normocephalic and atraumatic.  Eyes: Pupils are equal, round, and reactive to light.  Cardiovascular: Regular rhythm.   Pulmonary/Chest: Effort normal.  Abdominal: Soft.  Neurological: He is alert and oriented to person, place, and time.       5/5 strength in major muscle groups of  bilateral upper and lower extremities.  Speech normal. No facial asymetry.     ED Course  Procedures (including critical care time)  Labs Reviewed - No data to display No results found.   1. Double vision       MDM  Transient diplopia of 3 seconds.  Asymptomatic now with normal neurologic exam.  I'm unsure whether or not this is monocular or binocular diplopia.  My suspicion for stroke and TIA is very low.  He is already on a full-strength aspirin.  He has no carotid bruits.  He has a distant history of paroxysmal A. fib but he has no evidence of atrial fibrillation at this time with normal heart rhythm auscultated.  Close followup as an outpatient recommended with his primary care physician.  An outpatient the patient can obtain carotid ultrasounds to further evaluate however I do not think this is a vascular problem.  Patient is encouraged to return immediately to the ER for  return of his symptoms or development of any other neurologic symptoms including but not limited to unilateral weakness numbness or difficulty with speech or development of facial droop.  The patient and his spouse understand this        Lyanne Co, MD 10/21/11 [phone]

## 2012-12-28 ENCOUNTER — Encounter (INDEPENDENT_AMBULATORY_CARE_PROVIDER_SITE_OTHER): Payer: Self-pay | Admitting: Internal Medicine

## 2012-12-28 ENCOUNTER — Ambulatory Visit (INDEPENDENT_AMBULATORY_CARE_PROVIDER_SITE_OTHER): Payer: BC Managed Care – PPO | Admitting: Internal Medicine

## 2012-12-28 VITALS — BP 124/82 | HR 80 | Temp 97.7°F | Ht 73.0 in | Wt 202.5 lb

## 2012-12-28 DIAGNOSIS — I10 Essential (primary) hypertension: Secondary | ICD-10-CM | POA: Insufficient documentation

## 2012-12-28 DIAGNOSIS — K209 Esophagitis, unspecified without bleeding: Secondary | ICD-10-CM | POA: Insufficient documentation

## 2012-12-28 DIAGNOSIS — M109 Gout, unspecified: Secondary | ICD-10-CM | POA: Insufficient documentation

## 2012-12-28 NOTE — Progress Notes (Signed)
Subjective:     Patient ID: Jacob [name], male   DOB: 1951-10-22, 62 y.o.   MRN: [id number]  HPI Presents today with c/o He tells me he had a salivary gland infection in December after Christmas.  He tells me his neck was swollen.  Seen in the ED and placed on Clindamycin.  Took for 3 days and stopped. Says he began to have heart burn.  Stopped after 3 days. Swelling in his neck resolved. He tells me today he is not hurting in his esophagus. This is the first day that it has not hurt. He usually does not have problems with acid reflux or burning in his esophagus. Appetite is good. No weight loss. BMs are normal. No extra NSAIDs. Takes ASA 325mg  daily. No  Bright red rectal bleeding or melena.   Review of Systems see hpi Current Outpatient Prescriptions  Medication Sig Dispense Refill  . aspirin 325 MG tablet Take 325 mg by mouth daily.        . carvedilol (COREG) 3.125 MG tablet Take 3.125 mg by mouth 2 (two) times daily with a meal.        . furosemide (LASIX) 40 MG tablet Take 40 mg by mouth daily.        Marland Kitchen lisinopril (PRINIVIL,ZESTRIL) 5 MG tablet Take 10 mg by mouth daily.        Past Medical History  Diagnosis Date  . Hypertension   . Gout    Past Surgical History  Procedure Date  . Cardiac surgery   . Mitral valve surgery    Allergies  Allergen Reactions  . Clindamycin/Lincomycin         Objective:   Physical Exam Filed Vitals:   12/28/12 1414  BP: 124/82  Pulse: 80  Temp: 97.7 F (36.5 C)  Height: 6\' 1"  (1.854 m)  Weight: 202 lb 8 oz (91.853 kg)   Alert and oriented. Skin warm and dry. Oral mucosa is moist.   . Sclera anicteric, conjunctivae is pink. Thyroid not enlarged. No cervical lymphadenopathy. Lungs clear. Heart regular rate and rhythm.  Abdomen is soft. Bowel sounds are positive. No hepatomegaly. No abdominal masses felt. No tenderness.  No edema to lower extremities.     Assessment:    Esophagitis probably induced by Clindamycin. Much better now.  I discussed with Dr. Karilyn Cota.    Plan:    OV prn. No Clindamycin+-

## 2012-12-28 NOTE — Patient Instructions (Addendum)
Do not take Clindamycin. F/u prn,.

## 2013-03-22 ENCOUNTER — Other Ambulatory Visit (HOSPITAL_COMMUNITY): Payer: Self-pay | Admitting: Cardiovascular Disease

## 2013-03-22 DIAGNOSIS — I050 Rheumatic mitral stenosis: Secondary | ICD-10-CM

## 2013-03-25 ENCOUNTER — Ambulatory Visit (HOSPITAL_COMMUNITY)
Admission: RE | Admit: 2013-03-25 | Discharge: 2013-03-25 | Disposition: A | Discharge status: Home / Self Care | Payer: BC Managed Care – PPO | Source: Ambulatory Visit | Attending: Cardiovascular Disease | Admitting: Cardiovascular Disease

## 2013-03-25 DIAGNOSIS — I050 Rheumatic mitral stenosis: Secondary | ICD-10-CM

## 2013-03-25 DIAGNOSIS — I059 Rheumatic mitral valve disease, unspecified: Secondary | ICD-10-CM | POA: Insufficient documentation

## 2013-03-25 NOTE — Progress Notes (Signed)
[facility]   2D echo completed 03/25/2013.   Cindy Eowyn Tabone, RDCS  

## 2013-03-28 ENCOUNTER — Encounter: Payer: Self-pay | Admitting: *Deleted

## 2013-03-28 ENCOUNTER — Encounter: Payer: Self-pay | Admitting: Cardiovascular Disease

## 2013-03-29 ENCOUNTER — Encounter: Payer: Self-pay | Admitting: Cardiovascular Disease

## 2013-05-04 ENCOUNTER — Ambulatory Visit (INDEPENDENT_AMBULATORY_CARE_PROVIDER_SITE_OTHER): Payer: BC Managed Care – PPO | Admitting: Cardiovascular Disease

## 2013-05-04 ENCOUNTER — Encounter: Payer: Self-pay | Admitting: Cardiovascular Disease

## 2013-05-04 VITALS — BP 138/100 | HR 88 | Resp 16 | Ht 73.0 in | Wt 205.0 lb

## 2013-05-04 DIAGNOSIS — I484 Atypical atrial flutter: Secondary | ICD-10-CM | POA: Insufficient documentation

## 2013-05-04 DIAGNOSIS — I499 Cardiac arrhythmia, unspecified: Secondary | ICD-10-CM

## 2013-05-04 DIAGNOSIS — I10 Essential (primary) hypertension: Secondary | ICD-10-CM

## 2013-05-04 DIAGNOSIS — I471 Supraventricular tachycardia: Secondary | ICD-10-CM

## 2013-05-04 DIAGNOSIS — I059 Rheumatic mitral valve disease, unspecified: Secondary | ICD-10-CM

## 2013-05-04 DIAGNOSIS — I498 Other specified cardiac arrhythmias: Secondary | ICD-10-CM

## 2013-05-04 DIAGNOSIS — I4892 Unspecified atrial flutter: Secondary | ICD-10-CM

## 2013-05-04 DIAGNOSIS — I341 Nonrheumatic mitral (valve) prolapse: Secondary | ICD-10-CM | POA: Insufficient documentation

## 2013-05-04 MED ORDER — CARVEDILOL 6.25 MG PO TABS: 6.2500 mg | ORAL_TABLET | Freq: Two times a day (BID) | ORAL | Status: DC

## 2013-05-04 NOTE — Assessment & Plan Note (Signed)
Not well controlled. Carvedilol increased to 6.25 mg BID

## 2013-05-04 NOTE — Progress Notes (Signed)
Patient ID: Jacob [name], male   DOB: November 22, 1951, 62 y.o.   MRN: [id number]  HPI  He does not feel well. He has very frequent "skipping of his heart beats". This happens frequently when he tries to walk the dog. They're accompanied by a marked dyspnea. On the other hand some days he is able to perform the same level of physical activity without any complaints.  Allergies  Allergen Reactions  . Cardizem (Diltiazem Hcl)   . Clindamycin/Lincomycin   . Warfarin And Related     "feels bad"  . Crestor (Rosuvastatin) Rash    Current Outpatient Prescriptions  Medication Sig Dispense Refill  . aspirin 325 MG tablet Take 325 mg by mouth daily.        . carvedilol (COREG) 6.25 MG tablet Take 1 tablet (6.25 mg total) by mouth 2 (two) times daily with a meal.  60 tablet  12  . furosemide (LASIX) 40 MG tablet Take 40 mg by mouth daily.        Marland Kitchen lisinopril (PRINIVIL,ZESTRIL) 20 MG tablet Take 20 mg by mouth daily.       No current facility-administered medications for this visit.    Past Medical History  Diagnosis Date  . Hypertension   . Gout   . 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 A-fib   . CAD (coronary artery disease)   . Dyslipidemia     Past Surgical History  Procedure Laterality Date  . Radiofrequency ablation  12/02/04    Dr. Monna Fam  . Mitral valve surgery  12/11/04    MV repair with Randa Evens ring,closure of PFO  . Cardiac catheterization  12/03/04    sign. one vessel disease,severe MR  . Cardiac catheterization  01/19/06    normal  . Cardiac catheterization  06/06/10    mild nonobstructive CAD  . Tonsillectomy  1959  . Cyst  1980    removed from wrist  . Salivary gland surgery  2004    growth    Family History  Problem Relation Age of Onset  . Cancer Mother     non-hodgkins lymphoma    History   Social History  . Marital Status: Married    Spouse Name: N/A   Number of Children: N/A  . Years of Education: N/A   Occupational History  . Not on file.   Social History Main Topics  . Smoking status: Former Games developer  . Smokeless tobacco: Not on file  . Alcohol Use: No  . Drug Use: No  . Sexually Active: Not on file   Other Topics Concern  . Not on file   Social History Narrative  . No narrative on file    ROS: He has palpitations and dyspnea. These mostly occur with activity but also sometimes at rest the The patient specifically denies any chest pain at rest exertion, orthopnea, paroxysmal nocturnal dyspnea, syncope, focal neurological deficits, intermittent claudication, lower extremity edema, unexplained weight gain, cough, hemoptysis or wheezing.  PHYSICAL EXAM BP 138/100  Pulse 88  Resp 16  Ht 6\' 1"  (1.854 m)  Wt 205 lb (92.987 kg)  BMI 27.05 kg/m2   General: Alert, oriented x3, no distress Head: no evidence of trauma, PERRL, EOMI, no exophtalmos or lid lag, no myxedema, no xanthelasma; normal ears, nose and oropharynx Neck: normal jugular venous pulsations and no hepatojugular reflux; brisk carotid pulses without delay and no  carotid bruits Chest: clear to auscultation, no signs of consolidation by percussion or palpation, normal fremitus, symmetrical and full respiratory excursions. Well-healed surgical scar of previous sternotomy Cardiovascular: normal position and quality of the apical impulse, regular rhythm, normal first and second heart sounds, no murmurs, rubs or gallops Abdomen: no tenderness or distention, no masses by palpation, no abnormal pulsatility or arterial bruits, normal bowel sounds, no hepatosplenomegaly Extremities: no clubbing, cyanosis or edema; 2+ radial, ulnar and brachial pulses bilaterally; 2+ right femoral, posterior tibial and dorsalis pedis pulses; 2+ left femoral, posterior tibial and dorsalis pedis pulsesno subclavian or femoral bruits Neurological: grossly nonfocal   EKG: Normal sinus rhythm normal  EKG  Echocardiogram performed about a year ago shows a mildly to moderately dilated left atrium and a normally functioning mitral annuloplasty repair with preserved left ventricular function  ASSESSMENT AND PLAN  Hypertension Not well controlled. Carvedilol increased to 6.25 mg BID  Atrial flutter He has frequent  numerous episodes of symptomatic palpitations that suggested atrial flutter and/or atrial fibrillation but we have been unable to document any of these recently. He was unable to wear an event monitor because of urgent reaction to the adhesive pads.  As before recommended that he undergo implantation of a loop recorder. I showed him a model of the device and again gave him a brochure about it. He appears much more interested now that he has seen how small the device is. He wants to check with his insurance coverage before he undergoes the procedure. We did go over the risks and benefits of the device and the surgical implantation procedure in a lot of detail today. It sounds like he is agreeable to go ahead with that from a medical point of view and then ascends all the aspects of the procedure but is worried about the financial implications. Astelin that he simply needs to give me a phone call if he wants to go ahead with it.  It is very important to see if he has recurrent atrial flutter atrial fibrillation due to the implications regarding anticoagulation. He does not want to start any anticoagulant therapy. He is relatively protected since his left atrial appendage was oversewn. I recommend that he continue taking aspirin. He is interested in undergoing ablation should he have evidence of recurrent arrhythmia.     Easten Maceachern

## 2013-05-04 NOTE — Patient Instructions (Addendum)
Increase carvedilol to 6.25 mg twice a day Call us to schedule loop recorder implantation

## 2013-05-04 NOTE — Assessment & Plan Note (Addendum)
He has frequent  numerous episodes of symptomatic palpitations that suggested atrial flutter and/or atrial fibrillation but we have been unable to document any of these recently. He was unable to wear an event monitor because of urgent reaction to the adhesive pads.  As before recommended that he undergo implantation of a loop recorder. I showed him a model of the device and again gave him a brochure about it. He appears much more interested now that he has seen how small the device is. He wants to check with his insurance coverage before he undergoes the procedure. We did go over the risks and benefits of the device and the surgical implantation procedure in a lot of detail today. It sounds like he is agreeable to go ahead with that from a medical point of view and then ascends all the aspects of the procedure but is worried about the financial implications. Astelin that he simply needs to give me a phone call if he wants to go ahead with it.  It is very important to see if he has recurrent atrial flutter atrial fibrillation due to the implications regarding anticoagulation. He does not want to start any anticoagulant therapy. He is relatively protected since his left atrial appendage was oversewn. I recommend that he continue taking aspirin. He is interested in undergoing ablation should he have evidence of recurrent arrhythmia.

## 2013-09-26 ENCOUNTER — Telehealth: Payer: Self-pay | Admitting: Cardiovascular Disease

## 2013-09-26 NOTE — Telephone Encounter (Signed)
Did not go to work this AM because he felt his heart was out of rhythm.  Calling now to get a note to return to work tomorrow because he feels better now.  He tried to see his PCP this AM but he is closed for the week.  Will send a messge to Dr. Salena Saner. He is scheduled for Link implant on Thursday.

## 2013-09-26 NOTE — Telephone Encounter (Signed)
Please call- need a note for work stating that he able to go back to work,

## 2013-09-26 NOTE — Telephone Encounter (Signed)
The fax number is [phone]

## 2013-09-27 NOTE — Telephone Encounter (Signed)
Please call-concerning her his note for work,

## 2013-09-27 NOTE — Telephone Encounter (Signed)
Please have Britta Mccreedy to call him.

## 2013-09-29 ENCOUNTER — Encounter: Payer: BC Managed Care – PPO | Admitting: Cardiovascular Disease

## 2013-09-29 ENCOUNTER — Encounter: Payer: Self-pay | Admitting: *Deleted

## 2013-09-29 ENCOUNTER — Ambulatory Visit (INDEPENDENT_AMBULATORY_CARE_PROVIDER_SITE_OTHER): Payer: BC Managed Care – PPO | Admitting: *Deleted

## 2013-09-29 ENCOUNTER — Encounter (HOSPITAL_COMMUNITY): Payer: Self-pay | Admitting: Pharmacist

## 2013-09-29 DIAGNOSIS — I4892 Unspecified atrial flutter: Secondary | ICD-10-CM

## 2013-09-29 DIAGNOSIS — R002 Palpitations: Secondary | ICD-10-CM

## 2013-09-29 NOTE — Patient Instructions (Signed)
Please schedule a loop recorder implant at your earliest convenience.

## 2013-09-29 NOTE — Telephone Encounter (Signed)
Note given to patient by [facility] today.

## 2013-09-29 NOTE — Progress Notes (Signed)
Patient presented to the office today under the pretences of having a linq implanted, but was scheduled for an event monitor placed. Linq implant scheduled after speaking to [facility]. Patient voiced understanding.

## 2013-10-03 ENCOUNTER — Other Ambulatory Visit: Payer: Self-pay | Admitting: *Deleted

## 2013-10-03 ENCOUNTER — Telehealth: Payer: Self-pay | Admitting: *Deleted

## 2013-10-03 NOTE — Telephone Encounter (Signed)
Order placed for Jacob [name]

## 2013-10-04 ENCOUNTER — Telehealth: Payer: Self-pay | Admitting: *Deleted

## 2013-10-04 NOTE — Telephone Encounter (Signed)
Insurance will not authorize the [employer] w/o him being seen again in the office.  Link cancelled.  Patient notified and voiced understanding.  Call transferred to scheduling to book an appt.

## 2013-10-05 ENCOUNTER — Encounter (HOSPITAL_COMMUNITY): Admission: RE | Payer: Self-pay | Source: Ambulatory Visit

## 2013-10-05 ENCOUNTER — Ambulatory Visit (HOSPITAL_COMMUNITY)
Admission: RE | Admit: 2013-10-05 | Payer: BC Managed Care – PPO | Source: Ambulatory Visit | Admitting: Cardiovascular Disease

## 2013-10-05 SURGERY — LOOP RECORDER IMPLANT
Anesthesia: LOCAL

## 2013-10-07 ENCOUNTER — Telehealth: Payer: Self-pay | Admitting: Cardiovascular Disease

## 2013-10-07 NOTE — Telephone Encounter (Signed)
LEFT MESSAGE TO CALL BACK

## 2013-10-07 NOTE — Telephone Encounter (Signed)
Waiting on additional information regarding implantable loop monitor  Please call.

## 2013-10-07 NOTE — Telephone Encounter (Signed)
Spoke with Jacob [name], She wanted to know if there was anymore detail for pre-cert Loop Recorder. Informed her that there is no visit note since 04/2013. Informed her that will have to defer to Dr C.   Next appointment later in the month. Spoke to [employer] -she will contact ANTHEM  BLUECROSS

## 2013-10-07 NOTE — Telephone Encounter (Signed)
I called and spoke with Ms. Jacob [name] at [employer] to confirm that there is no additional information at this time. The patient is scheduled to see Dr. Salena Saner on 10/12/13 and we will provide additional information at this time. She stated that she must close the case at this time, however, once additional clinicals are available, we may call back at [phone] and request a reconsideration nurse. I will follow up with this after the patient's OV on 10/12/13.

## 2013-10-12 ENCOUNTER — Ambulatory Visit: Payer: BC Managed Care – PPO | Admitting: Cardiovascular Disease

## 2013-11-21 ENCOUNTER — Other Ambulatory Visit: Payer: Self-pay | Admitting: *Deleted

## 2013-11-21 MED ORDER — LISINOPRIL 20 MG PO TABS: 20.0000 mg | ORAL_TABLET | Freq: Every day | ORAL | Status: DC

## 2013-11-21 NOTE — Telephone Encounter (Signed)
Rx was sent to pharmacy electronically. 

## 2013-12-09 ENCOUNTER — Telehealth: Payer: Self-pay | Admitting: Cardiovascular Disease

## 2013-12-09 NOTE — Telephone Encounter (Signed)
Has a question pertaining to his AFib.. Please Call  Thanks

## 2013-12-09 NOTE — Telephone Encounter (Signed)
Returned call and pt verified x 2.  Pt stated he has been having trouble w/ Afib and Dr. Salena Saner wanted him to wear a monitor.  Stated he was at his PCP's office and was in Afib.  Stated Dr. Juanetta Gosling confirmed it was Afib and pt wants to know if Dr. Salena Saner still wants him to wear the monitor b/c he (Dr. Salena Saner) thought it was Afib too.  RN asked pt if Dr. Juanetta Gosling sent the EKG to Dr. Salena Saner and pt stated he didn't do an EKG.  Stated he was listening to his heart and said it was Afib.  Pt stated he is allergic to the glue on the electrodes and can't wear the monitor, unless he has to.  Pt informed he may still need to be seen r/t f/u appt scheduled and to confirm Afib.  RN reviewed notes and informed pt he was supposed to f/u r/t loop recorder.  Pt stated his insurance didn't want to pay for it.  Pt informed there were some conversations between his insurance company and our office and there was going to be a f/u after the OV he was supposed to have 10.22.14, which was canceled.  Pt stated he had to cancel r/t many deaths in the family.  Pt informed Dr. Salena Saner will be notified for further instructions.  Pt verbalized understanding and agreed w/ plan.  Message forwarded to Dr. Royann Shivers.

## 2013-12-13 ENCOUNTER — Telehealth: Payer: Self-pay | Admitting: *Deleted

## 2013-12-13 NOTE — Telephone Encounter (Signed)
Patient will call or go to his PCP if he has any symptoms of palpitations/? Atrial fib.  Will make an appt to see Dr. Salena Saner for documentation of palpitations to determine if he needs a Loop Recorder.

## 2013-12-13 NOTE — Telephone Encounter (Signed)
Message copied by Vita Barley on Tue Dec 13, 2013  2:55 PM ------      Message from: Thurmon Fair      Created: Tue Dec 13, 2013  8:17 AM       We have been trying to document that his arrhythmia truly is AFib. Any chance he gets he should have an ECG for palpitations. Other than that, at his convenience, needs an office visit to document that he has symptomatic palpitations so that they will approve a loop recorder. If we catch the arrhythmia on ECG, the loop recorder is no longer a priority, although it would still help to monitor response to therapy. ------

## 2014-03-08 ENCOUNTER — Telehealth (HOSPITAL_COMMUNITY): Payer: Self-pay | Admitting: Dietician

## 2014-03-08 NOTE — Telephone Encounter (Signed)
Received consult via fax from Dr. Luan Pulling for dx: diabetes. Sent letter to pt home via [place] [employer] in attempt to contact pt to schedule appointment.

## 2014-03-20 NOTE — Telephone Encounter (Signed)
Received voicemails left at 1625 and 1050. Called back at 1647 and left offered class dates for April (03/27/14 and 04/13/14, both at 1730).

## 2014-03-21 NOTE — Telephone Encounter (Signed)
Received message at [phone]. Pt scheduled for 03/30/14 DM class at 1730.

## 2014-03-30 ENCOUNTER — Encounter (HOSPITAL_COMMUNITY): Payer: Self-pay | Admitting: Dietician

## 2014-03-30 NOTE — Progress Notes (Signed)
[facility] Diabetes Class Completion  Date:March 30, 2014  Time: 1730  Pt attended [facility] Education Class on March 30, 2014.   Patient was educated on the following topics:   -Survival skills (signs and symptoms of hyperglycemia and hypoglycemia, treatment for hypoglycemia, ideal levels for fasting and postprandial blood sugars, goal Hgb A1c level, foot care basics)  -Recommendations for physical activity   -Carbohydrate metabolism in relation to diabetes   -Meal planning (sources of carbohydrate, carbohydrate counting, meal planning strategies, food label reading, and portion control).  Handouts provided:  -"Diabetes and You: Taking Charge of Your Health"  -"Carbohydrate Counting and Meal Planning"  -"Your Guide to Better Office Visits"   Issiah Huffaker A. Naeem Quillin, RD, LDN  

## 2014-06-02 ENCOUNTER — Other Ambulatory Visit: Payer: Self-pay | Admitting: *Deleted

## 2014-06-02 MED ORDER — LISINOPRIL 20 MG PO TABS: 20.0000 mg | ORAL_TABLET | Freq: Every day | ORAL | Status: DC

## 2014-08-31 ENCOUNTER — Other Ambulatory Visit: Payer: Self-pay | Admitting: Cardiovascular Disease

## 2014-08-31 NOTE — Telephone Encounter (Signed)
Rx refill sent to patient pharmacy   

## 2014-10-03 ENCOUNTER — Other Ambulatory Visit: Payer: Self-pay | Admitting: Cardiovascular Disease

## 2014-11-13 ENCOUNTER — Other Ambulatory Visit: Payer: Self-pay

## 2014-11-13 MED ORDER — LISINOPRIL 20 MG PO TABS: 20.0000 mg | ORAL_TABLET | Freq: Every day | ORAL | Status: DC

## 2014-11-13 NOTE — Telephone Encounter (Signed)
Rx sent to pharmacy   

## 2014-11-24 ENCOUNTER — Encounter: Payer: Self-pay | Admitting: Cardiovascular Disease

## 2014-11-24 ENCOUNTER — Ambulatory Visit (INDEPENDENT_AMBULATORY_CARE_PROVIDER_SITE_OTHER): Payer: BC Managed Care – PPO | Admitting: Cardiovascular Disease

## 2014-11-24 VITALS — BP 142/92 | HR 75 | Resp 16 | Ht 73.0 in | Wt 183.0 lb

## 2014-11-24 DIAGNOSIS — I471 Supraventricular tachycardia: Secondary | ICD-10-CM

## 2014-11-24 DIAGNOSIS — I4892 Unspecified atrial flutter: Secondary | ICD-10-CM

## 2014-11-24 DIAGNOSIS — I10 Essential (primary) hypertension: Secondary | ICD-10-CM

## 2014-11-24 DIAGNOSIS — I341 Nonrheumatic mitral (valve) prolapse: Secondary | ICD-10-CM

## 2014-11-24 DIAGNOSIS — R079 Chest pain, unspecified: Secondary | ICD-10-CM

## 2014-11-24 NOTE — Progress Notes (Signed)
Patient ID: Jacob [name], male   DOB: 1951-03-28, 63 y.o.   MRN: [id number]     Reason for office visit S/p MV annuloplasty repair, s/p atrial flutter ablation, chest pain, palpitations  Jacob [name] is here for routine follow-up but he is experiencing some exertional chest discomfort. 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 He walks 5-8 miles a day. During the afternoon of a long day at work while pushing heavy pallets he will notice an "irritating" discomfort in his immediate precordial area. It does not seem to resolve immediately upon rest but persists for a long part of the evening. He denies shortness of breath. He continues to have palpitations but these have diminished in frequency since his previous appointment.  Since his last appointment she was diagnosed with diabetes mellitus, but he has lost substantial weight and now appears lean and fit. His BMI is only 24. His last hemoglobin A1c was only 6.1%. He also had cholesterol checked with Dr. Luan Pulling and was told that the results were generally good, but his triglycerides possibly were still little high.  He has not had syncope, lower showed edema or any shortness of breath.  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 At that time his left atrial appendage was oversewn and a patent foramen ovale was closed. Preoperative cardiac catheterization showed noncritical coronary atherosclerosis. Repeat cardiac catheterization in 2011 again showed minimal irregularities, the worst lesion being a 40% stenosis in the mid right coronary artery. He has had a couple of normal nuclear stress test, most recently in 2009. He also has a history of hypertension and dyslipidemia and as mentioned recently diabetes mellitus type 2. He quit smoking in 1986.  Echo has a mild to moderately dilated left atrium. 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 He cannot wear an event monitor since he is allergic to the adhesive pads. He has hypertension, mild type 2 diabetes mellitus and hyperlipidemia.   Allergies  Allergen Reactions  . Cardizem [Diltiazem Hcl]     Pt unsure of reaction  . Clindamycin/Lincomycin     Acid refux  . Warfarin And Related     "feels bad"    Current Outpatient Prescriptions  Medication Sig Dispense Refill  . acetaminophen (TYLENOL) 500 MG tablet Take 1,000 mg by mouth daily as needed for pain (headaches).    Marland Kitchen aspirin 325 MG tablet Take 325 mg by mouth daily.      . furosemide (LASIX) 40 MG tablet Take 40 mg by mouth daily.      Marland Kitchen lisinopril (PRINIVIL,ZESTRIL) 20 MG tablet Take 1 tablet (20 mg total) by mouth daily. 15 tablet 0  . metFORMIN (GLUCOPHAGE) 500 MG tablet Take 500 mg by mouth 2 (two) times daily.  11  . carvedilol (COREG) 6.25 MG tablet Take 1 tablet (6.25 mg total) by mouth 2 (two) times daily with a meal. (Patient not taking: Reported on 11/24/2014) 60 tablet 12   No current facility-administered medications for this visit.    Past Medical History  Diagnosis Date  . Hypertension   . Gout   . 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 A-fib   . CAD (coronary artery disease)   . Dyslipidemia     Past Surgical History  Procedure Laterality Date  . Radiofrequency ablation  12/02/04    Dr. Emelda Brothers  . Mitral valve surgery  12/11/04    MV repair with Oletta Lamas ring,closure of PFO  . Cardiac catheterization  12/03/04    sign. one vessel disease,severe MR  . Cardiac catheterization  01/19/06    normal  . Cardiac catheterization  06/06/10    mild nonobstructive CAD  . Tonsillectomy  1959  .  Cyst  1980    removed from wrist  . Salivary gland surgery  2004    growth    Family History  Problem Relation Age of Onset  . Cancer Mother     non-hodgkins lymphoma    History   Social History  . Marital Status: Married    Spouse Name: N/A    Number of Children: N/A  . Years of Education: N/A   Occupational History  . Not on file.   Social History Main Topics  . Smoking status: Former Research scientist (life sciences)  . Smokeless tobacco: Not on file  . Alcohol Use: No  . Drug Use: No  . Sexual Activity: Not on file   Other Topics Concern  . Not on file   Social History Narrative    Review of systems: Palpitations and post exertional chest discomfort as described above The patient specifically denies any chest pain at rest, dyspnea at rest or with exertion, orthopnea, paroxysmal nocturnal dyspnea, syncope, palpitations, focal neurological deficits, intermittent claudication, lower extremity edema, unexplained weight gain, cough, hemoptysis or wheezing.  The patient also denies abdominal pain, nausea, vomiting, dysphagia, diarrhea, constipation, polyuria, polydipsia, dysuria, hematuria, frequency, urgency, abnormal bleeding or bruising, fever, chills, unexpected weight changes, mood swings, change in skin or hair texture, change in voice quality, auditory or visual problems, allergic reactions or rashes, new musculoskeletal complaints other than usual "aches and pains".   PHYSICAL EXAM BP 142/92 mmHg  Pulse 75  Resp 16  Ht 6\' 1"  (1.854 m)  Wt 183 lb (83.008 kg)  BMI 24.15 kg/m2 Recheck BP 136/80 mmHg  General: Alert, oriented x3, no distress Head: no evidence of trauma, PERRL, EOMI, no exophtalmos or lid lag, no myxedema, no xanthelasma; normal ears, nose and oropharynx Neck: normal jugular venous pulsations and no hepatojugular reflux; brisk carotid pulses without delay and no carotid bruits Chest: clear to auscultation, no signs of consolidation by percussion or palpation, normal  fremitus, symmetrical and full respiratory excursions Cardiovascular: normal position and quality of the apical impulse, regular rhythm, normal first and second heart sounds, no murmurs, rubs or gallops. Sternotomy scar Abdomen: no tenderness or distention, no masses by palpation, no abnormal pulsatility or arterial bruits, normal bowel sounds, no hepatosplenomegaly Extremities: no clubbing, cyanosis or edema; 2+ radial, ulnar and brachial pulses bilaterally; 2+ right femoral, posterior tibial and dorsalis pedis pulses; 2+ left femoral, posterior tibial and dorsalis pedis pulses; no subclavian or femoral bruits Neurological: grossly nonfocal   EKG: Normal sinus rhythm with one PAC and one PVC. He was not aware of any palpitations today during the exam.  Lipid Panel  No results found for: CHOL, TRIG, HDL, CHOLHDL, VLDL, LDLCALC, LDLDIRECT  BMET No results found for: NA, K, CL, CO2, GLUCOSE, BUN, CREATININE, CALCIUM, GFRNONAA, GFRAA   ASSESSMENT AND PLAN  His chest pain is not typical for exertional angina pectoris (it seems to occur inconsistently during physical exercise and  persisted for a long time afterwards, suggesting it might be musculoskeletal. He did not have any angina prior to his heart surgery and did not require coronary vascularization but did have moderate atherosclerosis. We'll schedule him for a stress test.  Will retrieve his laboratory results from Dr. Luan Pulling, but I'm sure that with his weight loss his lipid profile has improved. Congratulated on his improved exercise and eating habits.  Physical exam there is no evidence of dysfunction of his mitral valve repair. His palpitations have diminished in frequency and dome bother him as much. Today we have evidence of PACs and PVCs which might be an alternative explanation for his palpitations, rather than recurrent atrial flutter. His atrial flutter was a postoperative phenomenon and he has had his left atrial appendage oversewn,  his risk of embolic events is questionable. I would still like him to have a loop recorder implanted so to get an accurate diagnosis for his palpitations.  Meds ordered this encounter  Medications  . metFORMIN (GLUCOPHAGE) 500 MG tablet    Sig: Take 500 mg by mouth 2 (two) times daily.    Refill:  [address] [place], [facility] [phone] office [phone] pager

## 2014-11-24 NOTE — Patient Instructions (Signed)
  We will see you back in follow up in 1 year with Dr Sallyanne Kuster.  Dr Sallyanne Kuster has ordered: Your physician has requested that you have an exercise tolerance test. For further information please visit [email or web address]. Please also follow instruction sheet, as given.

## 2014-12-26 ENCOUNTER — Telehealth (HOSPITAL_COMMUNITY): Payer: Self-pay

## 2014-12-26 NOTE — Telephone Encounter (Signed)
Encounter complete. 

## 2014-12-28 ENCOUNTER — Ambulatory Visit (HOSPITAL_COMMUNITY)
Admission: RE | Admit: 2014-12-28 | Discharge: 2014-12-28 | Disposition: A | Discharge status: Home / Self Care | Payer: PRIVATE HEALTH INSURANCE | Source: Ambulatory Visit | Attending: Cardiovascular Disease | Admitting: Cardiovascular Disease

## 2014-12-28 DIAGNOSIS — I4892 Unspecified atrial flutter: Secondary | ICD-10-CM | POA: Insufficient documentation

## 2014-12-28 DIAGNOSIS — R079 Chest pain, unspecified: Secondary | ICD-10-CM | POA: Diagnosis not present

## 2014-12-28 NOTE — Procedures (Signed)
Exercise Treadmill Test   Test  Exercise Tolerance Test Ordering MD: [name], MD    Unique Test No: 1  Treadmill:  1  Indication for ETT: Palpitations  Contraindication to ETT: No   Stress Modality: exercise - treadmill  Cardiac Imaging Performed: non   Protocol: standard Bruce - maximal  Max BP:  174/85  Max MPHR (bpm):  157 85% MPR (bpm):  133  MPHR obtained (bpm):  226 % MPHR obtained:  143  Reached 85% MPHR (min:sec):  5:45 Total Exercise Time (min-sec):  7:21  Workload in METS:  9 Borg Scale: 14  Reason ETT Terminated:  SVT    ST Segment Analysis At Rest: normal ST segments - no evidence of significant ST depression With Exercise: borderline ST changes  Other Information Arrhythmia:  SVT with rates to 220 sustained developed during test Angina during ETT:  absent (0) Quality of ETT:  non-diagnostic  ETT Interpretation:  borderline (indeterminate) with non-specific ST changes  Comments: SVT into the 220's developed during exercise, terminated with PVC's PACs and atrial bigeminy noted during the study Rate-related 1 mm horizontal ST depression noted during the SVT Otherwise, no significant ischemic EKG changes noted  Pixie Casino, MD, [facility] Attending Cardiologist [facility]]

## 2015-01-02 NOTE — Progress Notes (Signed)
Just start at lowest dose 3.125 mg BID and let's see how he feels.

## 2015-01-18 ENCOUNTER — Ambulatory Visit: Payer: BC Managed Care – PPO | Admitting: Cardiovascular Disease

## 2015-02-20 ENCOUNTER — Telehealth: Payer: Self-pay | Admitting: Cardiovascular Disease

## 2015-02-21 NOTE — Telephone Encounter (Signed)
Closed enounter °

## 2015-02-28 ENCOUNTER — Ambulatory Visit: Payer: PRIVATE HEALTH INSURANCE | Admitting: Cardiovascular Disease

## 2015-04-05 ENCOUNTER — Ambulatory Visit (INDEPENDENT_AMBULATORY_CARE_PROVIDER_SITE_OTHER): Payer: PRIVATE HEALTH INSURANCE | Admitting: Cardiovascular Disease

## 2015-04-05 ENCOUNTER — Encounter: Payer: Self-pay | Admitting: Cardiovascular Disease

## 2015-04-05 VITALS — BP 142/94 | HR 78 | Ht 73.0 in | Wt 178.9 lb

## 2015-04-05 DIAGNOSIS — I4892 Unspecified atrial flutter: Secondary | ICD-10-CM | POA: Diagnosis not present

## 2015-04-05 DIAGNOSIS — I471 Supraventricular tachycardia: Secondary | ICD-10-CM | POA: Diagnosis not present

## 2015-04-05 DIAGNOSIS — I10 Essential (primary) hypertension: Secondary | ICD-10-CM | POA: Diagnosis not present

## 2015-04-05 DIAGNOSIS — I341 Nonrheumatic mitral (valve) prolapse: Secondary | ICD-10-CM | POA: Diagnosis not present

## 2015-04-05 MED ORDER — CARVEDILOL 3.125 MG PO TABS: 3.1250 mg | ORAL_TABLET | Freq: Two times a day (BID) | ORAL | Status: DC

## 2015-04-05 NOTE — Progress Notes (Signed)
Patient ID: Jacob [name], male   DOB: 07/25/1951, 64 y.o.   MRN: [id number]     Cardiology Office Note   Date:  04/08/2015   ID:  [name], DOB 1951-09-04, MRN [id number]  PCP:  [name], MD  Cardiologist:   [name], MD   Chief complaint : Intermittent palpitations.   History of Present Illness: Jacob [name] is a 64 y.o. male who presents for follow-up of valvular heart disease and arrhythmia.   In January he underwent a routine treadmill stress test as part of his [employer] physical workup requirements. The treadmill stress test was normal but he developed very rapid atrial tachycardia at about 2 and 20 bpm during exercise. This is likely the cause of his intermittent palpitations.   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 At that time his left atrial appendage was oversewn and a patent foramen ovale was closed. Preoperative cardiac catheterization showed noncritical coronary atherosclerosis. Repeat cardiac catheterization in 2011 again showed minimal irregularities, the worst lesion being a 40% stenosis in the mid right coronary artery. He has had a couple of normal nuclear stress tests, most recently in 2009 and normal treadmill stress test most recently January 2016. He also has a history of hypertension and dyslipidemia and as mentioned recently diabetes mellitus type 2. He quit smoking in 1986. Echo has a mild to moderately dilated left atrium. 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 He cannot wear an event monitor since he is allergic to the adhesive pads. He has hypertension, mild type 2 diabetes mellitus and hyperlipidemia.   he has  recently lost substantial weight and now has been in optimal weight range for several months. Despite this his blood pressure is borderline high. He takes only low-dose metformin for diabetes. 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 He estimates that he has walked 15 miles in the last 3 weeks.   Past Medical History  Diagnosis Date  . Hypertension   . Gout   . 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 A-fib   . CAD (coronary artery disease)   . Dyslipidemia     Past Surgical History  Procedure Laterality Date  . Radiofrequency ablation  12/02/04    Dr. Emelda Brothers  . Mitral valve surgery  12/11/04    MV repair with Oletta Lamas ring,closure of PFO  . Cardiac catheterization  12/03/04    sign. one vessel disease,severe MR  . Cardiac catheterization  01/19/06    normal  . Cardiac catheterization  06/06/10    mild nonobstructive CAD  . Tonsillectomy  1959  . Cyst  1980    removed from wrist  . Salivary gland surgery  2004    growth     Current Outpatient Prescriptions  Medication Sig Dispense Refill  . acetaminophen (TYLENOL) 500 MG tablet Take 1,000 mg by mouth daily as needed for pain (headaches).    . carvedilol (COREG) 3.125 MG tablet Take 1 tablet (3.125 mg total) by mouth 2 (two) times daily. 180 tablet 3  . furosemide (LASIX) 40 MG tablet Take 40  mg by mouth daily.      Marland Kitchen lisinopril (PRINIVIL,ZESTRIL) 20 MG tablet Take 1 tablet (20 mg total) by mouth daily. 15 tablet 0  . metFORMIN (GLUCOPHAGE) 500 MG tablet Take 500 mg by mouth 2 (two) times daily.  11   No current facility-administered medications for this visit.    Allergies:   Cardizem; Clindamycin/lincomycin; and Warfarin and related    Social History:  The patient  reports that he has quit smoking. He does not have any smokeless tobacco history on file. He reports that he does not drink  alcohol or use illicit drugs.   Family History:  The patient's family history includes Cancer in his mother.    ROS:  Please see the history of present illness.    Palpitations. The patient specifically denies any chest pain at rest, dyspnea at rest or with exertion, orthopnea, paroxysmal nocturnal dyspnea, syncope, palpitations, focal neurological deficits, intermittent claudication, lower extremity edema, unexplained weight gain, cough, hemoptysis or wheezing.  The patient also denies abdominal pain, nausea, vomiting, dysphagia, diarrhea, constipation, polyuria, polydipsia, dysuria, hematuria, frequency, urgency, abnormal bleeding or bruising, fever, chills, unexpected weight changes, mood swings, change in skin or hair texture, change in voice quality, auditory or visual problems, allergic reactions or rashes, new musculoskeletal complaints other than usual "aches and pains"..   All other systems are reviewed and negative.    PHYSICAL EXAM: VS:  BP 142/94 mmHg  Pulse 78  Ht 6\' 1"  (1.854 m)  Wt 178 lb 14.4 oz (81.149 kg)  BMI 23.61 kg/m2 , BMI Body mass index is 23.61 kg/(m^2).  General: Alert, oriented x3, no distress Head: no evidence of trauma, PERRL, EOMI, no exophtalmos or lid lag, no myxedema, no xanthelasma; normal ears, nose and oropharynx Neck: normal jugular venous pulsations and no hepatojugular reflux; brisk carotid pulses without delay and no carotid bruits Chest: clear to auscultation, no signs of consolidation by percussion or palpation, normal fremitus, symmetrical and full respiratory excursions. Healthy sternotomy scar  Cardiovascular: normal position and quality of the apical impulse, regular rhythm, normal first and second heart sounds, no murmurs, rubs or gallops Abdomen: no tenderness or distention, no masses by palpation, no abnormal pulsatility or arterial bruits, normal bowel sounds, no hepatosplenomegaly Extremities: no clubbing, cyanosis or edema; 2+ radial,  ulnar and brachial pulses bilaterally; 2+ right femoral, posterior tibial and dorsalis pedis pulses; 2+ left femoral, posterior tibial and dorsalis pedis pulses; no subclavian or femoral bruits Neurological: grossly nonfocal Psych: euthymic mood, full affect   EKG:  EKG is ordered today. The ekg ordered today demonstrates normal sinus rhythm with 2 premature atrial complexes   Recent Labs: No results found for requested labs within last 365 days.    Lipid Panel No results found for: CHOL, TRIG, HDL, CHOLHDL, VLDL, LDLCALC, LDLDIRECT    Wt Readings from Last 3 Encounters:  04/05/15 178 lb 14.4 oz (81.149 kg)  11/24/14 183 lb (83.008 kg)  05/04/13 205 lb (92.987 kg)      ASSESSMENT AND PLAN:  1.  Coronary artery disease with mild to moderate obstruction by previous cardiac catheterization, asymptomatic and with normal recent stress test. Focuses on risk factor modification  2.  degenerative mitral valve disease status post mitral valve annuloplasty with favorable echo findings (last echo April 2014) and preserved left ventricular systolic function  3. Remote history of atrial flutter status post cardioversion and AV node reentry tachycardia status post radiofrequency ablation. His current palpitations seem to be related to PACs and  brief runs of paroxysmal atrial tachycardia. This may represent reentry around the atriotomy scar from his mitral valve surgery. He does not have sustained arrhythmia. His records showed that his left atrial appendage was oversewn which should offer some protection from embolic events. He prefers not to take anticoagulants. He lists warfarin as a "allergy". We'll start low-dose beta blocker both for blood pressure control and hopefully to reduce the frequency of his palpitations.    Current medicines are reviewed at length with the patient today.  The patient does not have concerns regarding medicines.  The following changes have been made:  Start  carvedilol 3.125 mg twice a day  Labs/ tests ordered today include:  Orders Placed This Encounter  Procedures  . EKG 12-Lead   Patient Instructions  START Carvedilol 3.125mg  twice a day.  Dr. Sallyanne Kuster recommends that you schedule a follow-up appointment in: One year.      Mikael Spray, MD  04/08/2015 1:58 PM    [name], MD, [facility] [phone] office [phone] pager

## 2015-04-05 NOTE — Patient Instructions (Signed)
START Carvedilol 3.125mg  twice a day.  Dr. Sallyanne Kuster recommends that you schedule a follow-up appointment in: One year.

## 2015-10-18 ENCOUNTER — Telehealth: Payer: Self-pay | Admitting: Cardiovascular Disease

## 2015-10-18 MED ORDER — METOPROLOL TARTRATE 25 MG PO TABS: 12.5000 mg | ORAL_TABLET | Freq: Two times a day (BID) | ORAL | Status: DC

## 2015-10-18 NOTE — Telephone Encounter (Signed)
Try metoprolol 12.5 mg BID please

## 2015-10-18 NOTE — Telephone Encounter (Signed)
Pt informed of Dr. Victorino December recommendations, med action and potential SE's discussed, Rx(s) requested were sent to preferred pharmacy electronically.

## 2015-10-18 NOTE — Telephone Encounter (Signed)
Please call,wants to know what can he take in the place of Carvedilol.

## 2015-10-18 NOTE — Telephone Encounter (Signed)
Pt seen this April. He was put on carvedilol for control of HR... States he remained on medication for about 3 months but had some unintended side effects w/ it - wife noticed increase in irritability, was "becoming mean".  He states no fatigue or other noticeably SEs other than a negative behavior change.  He has been off the carvedilol since July. Starting in August or September, he noticed a return in his heart "going out of rhythm". Mainly noticeable w/ exertion/activity.  Pt requesting recommendation of new med.  He has return OV w/ Dr. Sallyanne Kuster scheduled for Jan 10, 2016.  Informed pt I would route to Dr. Sallyanne Kuster for recommendations.

## 2016-01-10 ENCOUNTER — Encounter: Payer: Self-pay | Admitting: Cardiovascular Disease

## 2016-01-10 ENCOUNTER — Ambulatory Visit (INDEPENDENT_AMBULATORY_CARE_PROVIDER_SITE_OTHER): Payer: PRIVATE HEALTH INSURANCE | Admitting: Cardiovascular Disease

## 2016-01-10 VITALS — BP 110/70 | HR 95 | Ht 73.0 in | Wt 181.9 lb

## 2016-01-10 DIAGNOSIS — I471 Supraventricular tachycardia: Secondary | ICD-10-CM | POA: Diagnosis not present

## 2016-01-10 DIAGNOSIS — Z9889 Other specified postprocedural states: Secondary | ICD-10-CM | POA: Diagnosis not present

## 2016-01-10 DIAGNOSIS — I10 Essential (primary) hypertension: Secondary | ICD-10-CM

## 2016-01-10 DIAGNOSIS — I4892 Unspecified atrial flutter: Secondary | ICD-10-CM | POA: Diagnosis not present

## 2016-01-10 DIAGNOSIS — I341 Nonrheumatic mitral (valve) prolapse: Secondary | ICD-10-CM

## 2016-01-10 DIAGNOSIS — E119 Type 2 diabetes mellitus without complications: Secondary | ICD-10-CM | POA: Insufficient documentation

## 2016-01-10 MED ORDER — NEBIVOLOL HCL 2.5 MG PO TABS: 2.5000 mg | ORAL_TABLET | Freq: Every day | ORAL | Status: DC

## 2016-01-10 NOTE — Progress Notes (Signed)
Cardiology Office Note    Date:  01/11/2016   ID:  JERONE BOURLIER, DOB 1951/08/03, MRN [id number]  PCP:  [name], MD  Cardiologist:   [name], MD   Chief Complaint  Patient presents with  . Annual Exam    patient reports extreme fatigue in the last couple months, shortness of breath on exertion    History of Present Illness:  Jacob [name] is a 65 y.o. male Jacob [name] is a 65 y.o. male who presents for follow up for atrial arrhythmia and history of MV replacement.  He has kept the weight he lost off, has an optimal BMI and looks fit. He has had occasional palpitations.  In January 2016, he developed very rapid atrial tachycardia (scar related flutter?) at about 220 bpm during exercise testing. This is likely the cause of his intermittent palpitations.   He describes fatigue and has a rather high resting heart rate. He is still walking daily. He reports A1c 6.3% and normal creatinine. He takes extra torsemide when the ring on his finger feels tighter.  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 At that time his left atrial appendage was oversewn and a patent foramen ovale was closed. Preoperative cardiac catheterization showed noncritical coronary atherosclerosis. Repeat cardiac catheterization in 2011 again showed minimal irregularities, the worst lesion being a 40% stenosis in the mid right coronary artery. He has had a couple of normal nuclear stress tests, most recently in 2009 and normal treadmill stress test most recently January 2016.   He also has a history of hypertension and dyslipidemia and as mentioned recently diabetes mellitus type 2. He quit smoking in 1986. Echo has a mild to moderately dilated left atrium.   He has had AV node modification for typical AV node reentry  tachycardia (2005) and cardioversion for atrial flutter (2006, roughly 1 month after his mitral surgery). He cannot wear an event monitor since he is allergic to the adhesive pads. He was intolerant to metoprolol and carvedilol (fatigue).    Past Medical History  Diagnosis Date  . Hypertension   . Gout   . 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 A-fib ([place])   . CAD (coronary artery disease)   . Dyslipidemia     Past Surgical History  Procedure Laterality Date  . Radiofrequency ablation  12/02/04    Dr. Emelda Brothers  . Mitral valve surgery  12/11/04    MV repair with Oletta Lamas ring,closure of PFO  . Cardiac catheterization  12/03/04    sign. one vessel disease,severe MR  . Cardiac catheterization  01/19/06    normal  . Cardiac catheterization  06/06/10    mild nonobstructive CAD  . Tonsillectomy  1959  . Cyst  1980    removed from wrist  . Salivary gland surgery  2004    growth    Outpatient Prescriptions Prior to Visit  Medication Sig Dispense Refill  . acetaminophen (TYLENOL) 500 MG tablet Take 1,000 mg by mouth daily as needed for pain (headaches).    Marland Kitchen lisinopril (PRINIVIL,ZESTRIL) 20 MG tablet Take 1 tablet (20 mg total) by mouth daily. 15 tablet 0  . metFORMIN (GLUCOPHAGE) 500 MG tablet Take 500 mg by mouth 2 (two)  times daily.  11  . carvedilol (COREG) 3.125 MG tablet Take 1 tablet (3.125 mg total) by mouth 2 (two) times daily. (Patient not taking: Reported on 01/10/2016) 180 tablet 3  . furosemide (LASIX) 40 MG tablet Take 40 mg by mouth daily. Reported on 01/10/2016    . metoprolol tartrate (LOPRESSOR) 25 MG tablet Take 0.5 tablets (12.5 mg total) by mouth 2 (two) times daily. (Patient not taking: Reported on 01/10/2016) 90 tablet 1   No facility-administered medications prior to visit.     Allergies:   Cardizem; Clindamycin/lincomycin; and Warfarin and related   Social History    Social History  . Marital Status: Married    Spouse Name: N/A  . Number of Children: N/A  . Years of Education: N/A   Social History Main Topics  . Smoking status: Former Research scientist (life sciences)  . Smokeless tobacco: None  . Alcohol Use: No  . Drug Use: No  . Sexual Activity: Not Asked   Other Topics Concern  . None   Social History Narrative     Family History:  The patient's family history includes Cancer in his mother.   ROS:   Please see the history of present illness.    ROS All other systems reviewed and are negative.   PHYSICAL EXAM:   VS:  BP 110/70 mmHg  Pulse 95  Ht 6\' 1"  (1.854 m)  Wt 181 lb 14.4 oz (82.509 kg)  BMI 24.00 kg/m2   GEN: Well nourished, well developed, in no acute distress HEENT: normal Neck: no JVD, carotid bruits, or masses Cardiac: RRR; no murmurs, rubs, or gallops,no edema , sternotomy scar Respiratory:  clear to auscultation bilaterally, normal work of breathing GI: soft, nontender, nondistended, + BS MS: no deformity or atrophy Skin: warm and dry, no rash Neuro:  Alert and Oriented x 3, Strength and sensation are intact Psych: euthymic mood, full affect  Wt Readings from Last 3 Encounters:  01/10/16 181 lb 14.4 oz (82.509 kg)  04/05/15 178 lb 14.4 oz (81.149 kg)  11/24/14 183 lb (83.008 kg)      Studies/Labs Reviewed:   EKG:  EKG is ordered today.  The ekg ordered today demonstrates sinus rhythm 95 bpm  Recent Labs: No results found for requested labs within last 365 days.   Lipid Panel No results found for: CHOL, TRIG, HDL, CHOLHDL, VLDL, LDLCALC, LDLDIRECT   ASSESSMENT:    1. Status post mitral valve repair   2. Mitral valve prolapse syndrome   3. AVNRT (AV nodal re-entry tachycardia) ([place])   4. Atrial flutter, unspecified type ([facility])   5. Paroxysmal atrial tachycardia ([place])   6. Essential hypertension   7. Diabetes mellitus type 2 in nonobese [facility])      PLAN:  In order of problems listed above:  1. S/P mitral  annuloplasty repair - normal findings on echo about 2 years ago 2. Had severe MR, preserved LVEF 3. AVNRT s/p AV node modification, no recurrence 4. Atypical atrial flutter - likely related to atriotomy scar. He was able to conduct 1:1 during his stress test and HR was 220 bpm. He would benefit from beta blockers, but was intolerant of conventional agents. Will try Bystolic in lowest dose. 5. Excellent BP control. May need to reduce or stop lisinopril if BP gets too low. However, I suspect he is taking excessive amounts of diuretic. Have asked him to avoid increasing the dose for diuretic based on his ring finger, likely not a reliable measure of his fluid status.  I believe his fatigue and resting rapid heart rate may indicate relative hypovolemia.    Medication Adjustments/Labs and Tests Ordered: Current medicines are reviewed at length with the patient today.  Concerns regarding medicines are outlined above.  Medication changes, Labs and Tests ordered today are listed in the Patient Instructions below. Patient Instructions  Your physician has recommended you make the following change in your medication: START BYSTOLIC 2.5 MG DAILY  Dr. Sallyanne Kuster recommends that you schedule a follow-up appointment in: [place], Glady Ouderkirk, MD  01/11/2016 11:05 PM    [address], [place], [place]  [id number] Phone: [phone]; Fax: [phone]

## 2016-01-10 NOTE — Patient Instructions (Signed)
Your physician has recommended you make the following change in your medication: START BYSTOLIC 2.5 MG DAILY  Dr. Sallyanne Kuster recommends that you schedule a follow-up appointment in: 6 MONTHS

## 2016-01-11 DIAGNOSIS — Z9889 Other specified postprocedural states: Secondary | ICD-10-CM | POA: Insufficient documentation

## 2016-01-14 ENCOUNTER — Telehealth: Payer: Self-pay | Admitting: *Deleted

## 2016-01-14 NOTE — Telephone Encounter (Signed)
Called patient to see how he is feeling on the bystolic.  Just started yesterday (pharmacy had to order).  Unable to check BP now due to an error code on machine.  Asked him to check his BP daily this week and call at the end of the week with reading.  Will call Friday with BP readings.  Dr. Loletha Grayer made aware of this and will await reading to decide on lisinopril dose.

## 2016-01-14 NOTE — Telephone Encounter (Signed)
-----   Message from [name], MD sent at 01/11/2016 11:15 PM EST ----- Please call him next Monday. Ask if he feels OK on Bystolic and if he has checked his BP. We may need to reduce his dose of lisinopril.

## 2016-07-28 ENCOUNTER — Encounter (INDEPENDENT_AMBULATORY_CARE_PROVIDER_SITE_OTHER): Payer: Self-pay

## 2016-07-28 ENCOUNTER — Encounter: Payer: Self-pay | Admitting: Cardiovascular Disease

## 2016-07-28 ENCOUNTER — Ambulatory Visit (INDEPENDENT_AMBULATORY_CARE_PROVIDER_SITE_OTHER): Payer: PRIVATE HEALTH INSURANCE | Admitting: Cardiovascular Disease

## 2016-07-28 VITALS — BP 130/82 | HR 66 | Ht 73.0 in | Wt 189.0 lb

## 2016-07-28 DIAGNOSIS — Z9889 Other specified postprocedural states: Secondary | ICD-10-CM

## 2016-07-28 DIAGNOSIS — I471 Supraventricular tachycardia: Secondary | ICD-10-CM

## 2016-07-28 DIAGNOSIS — I484 Atypical atrial flutter: Secondary | ICD-10-CM

## 2016-07-28 DIAGNOSIS — I10 Essential (primary) hypertension: Secondary | ICD-10-CM

## 2016-07-28 NOTE — Progress Notes (Signed)
Cardiology Office Note    Date:  07/29/2016   ID:  Jacob STAGGERS, DOB 02-01-1951, MRN [id number]  PCP:  [name], MD  Cardiologist:   [name], MD   Chief Complaint  Patient presents with  . Follow-up    6 months      History of Present Illness:  Jacob [name] is a 65 y.o. male who presents for follow up for atrial arrhythmia and history of MV replacement.  He feels well and continues to be physically active. He walks roughly 6 miles a day. has an optimal BMI and looks fit. He has had occasional palpitations. He is occur roughly once every 1-2 months and are usually brief. Sometimes coughing helps interrupt him. Occasionally they'll last up to one hour, but they do not associated dyspnea, angina, dizziness or syncope. He discontinued the beta blocker since it made him "always been a bad mood". He continues to take torsemide on a as needed basis, less frequently than once a week.  In January 2016, he developed very rapid atrial tachycardia (scar related flutter?) at about 220 bpm during exercise testing. This is likely the cause of his intermittent palpitations. 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 At that time his left atrial appendage was oversewn and a patent foramen ovale was closed. Preoperative cardiac catheterization showed noncritical coronary atherosclerosis. Repeat cardiac catheterization in 2011 again showed minimal irregularities, the worst lesion being a 40% stenosis in the mid right coronary artery. He has had a couple of normal nuclear stress tests, most recently in 2009 and normal treadmill stress test most recently January 2016.   He also has a history of hypertension and dyslipidemia  and weight gain related diabetes mellitus type 2. He quit smoking in 1986. Echo has a mild to moderately dilated left atrium.   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 He cannot wear an event monitor since he is allergic to the adhesive pads. He was intolerant to metoprolol and carvedilol (fatigue). Bystolic caused excessive irritability.    Past Medical History:  Diagnosis Date  . A-fib ([place])   . CAD (coronary artery disease)   . Dyslipidemia   . Gout   . Hypertension   . 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 CARDIAC CATHETERIZATION  12/03/04   sign. one vessel disease,severe MR  . CARDIAC CATHETERIZATION  01/19/06   normal  . CARDIAC CATHETERIZATION  06/06/10   mild nonobstructive CAD  . cyst  1980   removed from wrist  . MITRAL VALVE SURGERY  12/11/04   MV repair with Oletta Lamas ring,closure of PFO  . RADIOFREQUENCY ABLATION  12/02/04   Dr. Emelda Brothers  . SALIVARY GLAND SURGERY  2004   growth  . TONSILLECTOMY  1959    Outpatient Medications Prior to Visit  Medication Sig Dispense Refill  . lisinopril (PRINIVIL,ZESTRIL) 20 MG tablet Take 1 tablet (20 mg total) by mouth daily. 15 tablet 0  . metFORMIN (GLUCOPHAGE) 500 MG tablet Take  500 mg by mouth 2 (two) times daily.  11  . torsemide (DEMADEX) 20 MG tablet Take 20 mg by mouth daily.    Marland Kitchen acetaminophen (TYLENOL) 500 MG tablet Take 1,000 mg by mouth daily as needed for pain (headaches).    . nebivolol (BYSTOLIC) 2.5 MG tablet Take 1 tablet (2.5 mg total) by mouth daily. 30 tablet 6   No facility-administered medications prior to visit.      Allergies:   Cardizem [diltiazem hcl]; Clindamycin/lincomycin; and Warfarin and related   Social History   Social  History  . Marital status: Married    Spouse name: N/A  . Number of children: N/A  . Years of education: N/A   Social History Main Topics  . Smoking status: Former Research scientist (life sciences)  . Smokeless tobacco: None  . Alcohol use No  . Drug use: No  . Sexual activity: Not Asked   Other Topics Concern  . None   Social History Narrative  . None     Family History:  The patient's family history includes Cancer in his mother.   ROS:   Please see the history of present illness.    ROS All other systems reviewed and are negative.   PHYSICAL EXAM:   VS:  BP 130/82 (BP Location: Left Arm, Patient Position: Sitting, Cuff Size: Normal)   Pulse 66   Ht 6\' 1"  (1.854 m)   Wt 189 lb (85.7 kg)   BMI 24.94 kg/m    GEN: Well nourished, well developed, in no acute distress  HEENT: normal  Neck: no JVD, carotid bruits, or masses Cardiac: RRR; no murmurs, rubs, or gallops,no edema , sternotomy scar Respiratory:  clear to auscultation bilaterally, normal work of breathing GI: soft, nontender, nondistended, + BS MS: no deformity or atrophy  Skin: warm and dry, no rash Neuro:  Alert and Oriented x 3, Strength and sensation are intact Psych: euthymic mood, full affect  Wt Readings from Last 3 Encounters:  07/28/16 189 lb (85.7 kg)  01/10/16 181 lb 14.4 oz (82.5 kg)  04/05/15 178 lb 14.4 oz (81.1 kg)      Studies/Labs Reviewed:   EKG:  EKG is ordered today.  The ekg ordered today demonstrates sinus rhythm, QTC 434 ms   ASSESSMENT:    1. Atypical atrial flutter ([place])   2. AVNRT (AV nodal re-entry tachycardia) ([place])   3. Status post mitral valve repair   4. Essential hypertension      PLAN:  In order of problems listed above:   1. Atypical atrial flutter: - likely related to atriotomy scar. He was able to conduct 1:1 during his stress test and HR was 220 bpm. He would benefit from beta blockers, but was intolerant of multiple agents. He had an allergic reaction to diltiazem  2. History of  AVNRT: s/p AV node slow pathway ablation  3. S/P mitral annuloplasty repair: normal findings on echo 2014. Probably repeat an echo in a year or 2, unless symptoms demand earlier evaluation. 4. HTN: Excellent BP control. Avoid excessive use of diuretic.    Medication Adjustments/Labs and Tests Ordered: Current medicines are reviewed at length with the patient today.  Concerns regarding medicines are outlined above.  Medication changes, Labs and Tests ordered today are listed in the Patient Instructions below. Patient Instructions  Dr Sallyanne Kuster recommends that you schedule a follow-up appointment in 1 year. You will receive a reminder letter in the mail two months in advance. If you don't receive a letter, please call  our office to schedule the follow-up appointment.  If you need a refill on your cardiac medications before your next appointment, please call your pharmacy.      Signed, [name], MD  07/29/2016 2:46 PM    [address], [place], [place]  [address] Phone: [phone]; Fax: [phone]

## 2016-07-28 NOTE — Patient Instructions (Signed)
Dr Croitoru recommends that you schedule a follow-up appointment in 1 year. You will receive a reminder letter in the mail two months in advance. If you don't receive a letter, please call our office to schedule the follow-up appointment.  If you need a refill on your cardiac medications before your next appointment, please call your pharmacy. 

## 2016-08-01 ENCOUNTER — Telehealth: Payer: Self-pay | Admitting: Cardiovascular Disease

## 2016-08-01 NOTE — Telephone Encounter (Signed)
Per pt call he was told to call back to find out  What Dr Olevia Perches read on his EKG from [facility].   Please give him a call back.

## 2016-08-01 NOTE — Telephone Encounter (Signed)
Dr.Croitoru reviewed echo done 04/02/16 at [facility] hospital.He advised echo looks good.

## 2016-08-01 NOTE — Telephone Encounter (Signed)
Returned call to patient.He stated he had a echo done in May or June of this year at [facility] in [place], and wanted to know if Dr.Croitoru has reviewed yet.Advised I will ask Dr.Croitoru and call him back.

## 2016-08-05 ENCOUNTER — Telehealth: Payer: Self-pay | Admitting: Cardiovascular Disease

## 2016-08-05 NOTE — Telephone Encounter (Signed)
Received records from [facility] as requested by Dr Sallyanne Kuster.  Records given to Dr Sallyanne Kuster to review. lp

## 2017-01-12 DIAGNOSIS — I059 Rheumatic mitral valve disease, unspecified: Secondary | ICD-10-CM | POA: Diagnosis not present

## 2017-01-12 DIAGNOSIS — I4891 Unspecified atrial fibrillation: Secondary | ICD-10-CM | POA: Diagnosis not present

## 2017-01-12 DIAGNOSIS — E119 Type 2 diabetes mellitus without complications: Secondary | ICD-10-CM | POA: Diagnosis not present

## 2017-01-12 DIAGNOSIS — I10 Essential (primary) hypertension: Secondary | ICD-10-CM | POA: Diagnosis not present

## 2017-01-14 DIAGNOSIS — E119 Type 2 diabetes mellitus without complications: Secondary | ICD-10-CM | POA: Diagnosis not present

## 2017-05-26 DIAGNOSIS — M545 Low back pain: Secondary | ICD-10-CM | POA: Diagnosis not present

## 2017-05-26 DIAGNOSIS — I10 Essential (primary) hypertension: Secondary | ICD-10-CM | POA: Diagnosis not present

## 2017-05-26 DIAGNOSIS — E119 Type 2 diabetes mellitus without complications: Secondary | ICD-10-CM | POA: Diagnosis not present

## 2017-05-26 DIAGNOSIS — I4891 Unspecified atrial fibrillation: Secondary | ICD-10-CM | POA: Diagnosis not present

## 2017-05-27 DIAGNOSIS — W57XXXA Bitten or stung by nonvenomous insect and other nonvenomous arthropods, initial encounter: Secondary | ICD-10-CM | POA: Diagnosis not present

## 2017-05-27 DIAGNOSIS — E119 Type 2 diabetes mellitus without complications: Secondary | ICD-10-CM | POA: Diagnosis not present

## 2017-05-27 DIAGNOSIS — M545 Low back pain: Secondary | ICD-10-CM | POA: Diagnosis not present

## 2017-05-27 DIAGNOSIS — I10 Essential (primary) hypertension: Secondary | ICD-10-CM | POA: Diagnosis not present

## 2017-05-27 DIAGNOSIS — I4891 Unspecified atrial fibrillation: Secondary | ICD-10-CM | POA: Diagnosis not present

## 2017-05-29 ENCOUNTER — Encounter (HOSPITAL_COMMUNITY): Payer: Self-pay | Admitting: Emergency Medicine

## 2017-05-29 ENCOUNTER — Emergency Department (HOSPITAL_COMMUNITY): Payer: PPO

## 2017-05-29 ENCOUNTER — Emergency Department (HOSPITAL_COMMUNITY)
Admission: EM | Admit: 2017-05-29 | Discharge: 2017-05-29 | Disposition: A | Discharge status: Home / Self Care | Payer: PPO | Attending: Emergency Medicine | Admitting: Emergency Medicine

## 2017-05-29 DIAGNOSIS — I2510 Atherosclerotic heart disease of native coronary artery without angina pectoris: Secondary | ICD-10-CM | POA: Diagnosis not present

## 2017-05-29 DIAGNOSIS — M5441 Lumbago with sciatica, right side: Secondary | ICD-10-CM | POA: Insufficient documentation

## 2017-05-29 DIAGNOSIS — Z7984 Long term (current) use of oral hypoglycemic drugs: Secondary | ICD-10-CM | POA: Diagnosis not present

## 2017-05-29 DIAGNOSIS — Z79899 Other long term (current) drug therapy: Secondary | ICD-10-CM | POA: Diagnosis not present

## 2017-05-29 DIAGNOSIS — I10 Essential (primary) hypertension: Secondary | ICD-10-CM | POA: Insufficient documentation

## 2017-05-29 DIAGNOSIS — M545 Low back pain: Secondary | ICD-10-CM | POA: Diagnosis not present

## 2017-05-29 DIAGNOSIS — Z87891 Personal history of nicotine dependence: Secondary | ICD-10-CM | POA: Insufficient documentation

## 2017-05-29 DIAGNOSIS — M25551 Pain in right hip: Secondary | ICD-10-CM | POA: Diagnosis not present

## 2017-05-29 DIAGNOSIS — E119 Type 2 diabetes mellitus without complications: Secondary | ICD-10-CM | POA: Insufficient documentation

## 2017-05-29 LAB — CBC WITH DIFFERENTIAL/PLATELET
BASOS ABS: 0 10*3/uL (ref 0.0–0.1)
BASOS PCT: 0 %
Eosinophils Absolute: 0.1 10*3/uL (ref 0.0–0.7)
Eosinophils Relative: 1 %
HEMATOCRIT: 41 % (ref 39.0–52.0)
Hemoglobin: 14.5 g/dL (ref 13.0–17.0)
Lymphocytes Relative: 20 %
Lymphs Abs: 1.4 10*3/uL (ref 0.7–4.0)
MCH: 29.7 pg (ref 26.0–34.0)
MCHC: 35.4 g/dL (ref 30.0–36.0)
MCV: 83.8 fL (ref 78.0–100.0)
MONO ABS: 0.5 10*3/uL (ref 0.1–1.0)
Monocytes Relative: 7 %
NEUTROS ABS: 5.3 10*3/uL (ref 1.7–7.7)
NEUTROS PCT: 72 %
Platelets: 162 10*3/uL (ref 150–400)
RBC: 4.89 MIL/uL (ref 4.22–5.81)
RDW: 13.5 % (ref 11.5–15.5)
WBC: 7.3 10*3/uL (ref 4.0–10.5)

## 2017-05-29 LAB — URINALYSIS, ROUTINE W REFLEX MICROSCOPIC
Bilirubin Urine: NEGATIVE
Glucose, UA: NEGATIVE mg/dL
Hgb urine dipstick: NEGATIVE
KETONES UR: NEGATIVE mg/dL
LEUKOCYTES UA: NEGATIVE
NITRITE: NEGATIVE
PH: 7 (ref 5.0–8.0)
PROTEIN: NEGATIVE mg/dL
Specific Gravity, Urine: 1.024 (ref 1.005–1.030)

## 2017-05-29 LAB — BASIC METABOLIC PANEL
ANION GAP: 8 (ref 5–15)
BUN: 28 mg/dL — ABNORMAL HIGH (ref 6–20)
CALCIUM: 9 mg/dL (ref 8.9–10.3)
CO2: 28 mmol/L (ref 22–32)
Chloride: 105 mmol/L (ref 101–111)
Creatinine, Ser: 0.94 mg/dL (ref 0.61–1.24)
Glucose, Bld: 133 mg/dL — ABNORMAL HIGH (ref 65–99)
Potassium: 4 mmol/L (ref 3.5–5.1)
Sodium: 141 mmol/L (ref 135–145)

## 2017-05-29 MED ORDER — OXYCODONE-ACETAMINOPHEN 5-325 MG PO TABS: 1.0000 | ORAL_TABLET | ORAL | 0 refills | Status: DC | PRN

## 2017-05-29 MED ORDER — METHOCARBAMOL 500 MG PO TABS: 500.0000 mg | ORAL_TABLET | Freq: Three times a day (TID) | ORAL | 0 refills | Status: DC

## 2017-05-29 MED ORDER — OXYCODONE-ACETAMINOPHEN 5-325 MG PO TABS
1.0000 | ORAL_TABLET | Freq: Once | ORAL | Status: AC
  Administered 2017-05-29: 1 via ORAL
  Filled 2017-05-29: qty 1

## 2017-05-29 NOTE — Discharge Instructions (Signed)
Alternate ice and heat to your lower back.  Continue taking your steroid as directed until its finished.  Follow-up with Dr. Luan Pulling next week

## 2017-05-29 NOTE — ED Triage Notes (Signed)
Pt states pain started in right lower back and now has radiating down leg into right knee at this time. Pt states has been taking a lot of asa and tylenol with mild releif. No deformity and denis injury

## 2017-05-29 NOTE — ED Provider Notes (Signed)
[facility] DEPT Provider Note   CSN: [id number] Arrival date & time: 05/29/17  1003     History   Chief Complaint Chief Complaint  Patient presents with  . Back Pain    HPI Jacob [name] is a 66 y.o. male.  HPI  Jacob [name] is a 66 y.o. male who presents to the Emergency Department complaining of right low back pain and right knee pain.  symptoms present for several days.  He describes pain from the right low back that radiates to the knee.  Worse with movement and weight bearing. Also reports multiple tick bites recently.  He was seen by his PCP for this and prescribed steroid and doxycyline.  He reports some pain relief with steroid and ASA.  He denies fever, rash, chills, numbness or weakness of the extremities, urine or bowel changes and abd pain.    Past Medical History:  Diagnosis Date  . A-fib ([place])   . CAD (coronary artery disease)   . Dyslipidemia   . Gout   . Hypertension   . 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   Edwards ring    Patient Active Problem List   Diagnosis Date Noted  . Status post mitral valve repair 01/11/2016  . Diabetes mellitus type 2 in nonobese ([place]) 01/10/2016  . AVNRT (AV nodal re-entry tachycardia) ([place]) 05/04/2013  . Atrial flutter ([place]) 05/04/2013  . Mitral valve prolapse syndrome 05/04/2013  . Hypertension 12/28/2012  . Gout 12/28/2012  . Esophagitis 12/28/2012    Past Surgical History:  Procedure Laterality Date  . CARDIAC CATHETERIZATION  12/03/04   sign. one vessel disease,severe MR  . CARDIAC CATHETERIZATION  01/19/06   normal  . CARDIAC CATHETERIZATION  06/06/10   mild nonobstructive CAD  . cyst  1980   removed from wrist  . MITRAL VALVE SURGERY  12/11/04   MV repair with Oletta Lamas ring,closure of PFO  . RADIOFREQUENCY ABLATION  12/02/04   Dr. Emelda Brothers  . SALIVARY GLAND SURGERY  2004   growth  . TONSILLECTOMY  1959       Home  Medications    Prior to Admission medications   Medication Sig Start Date End Date Taking? Authorizing Provider  acetaminophen (TYLENOL) 650 MG CR tablet Take 1,300 mg by mouth every 6 (six) hours as needed for pain.   Yes [provider]  Aspirin-Caffeine (BAYER BACK & BODY) 500-32.5 MG TABS Take 2 tablets by mouth every 6 (six) hours as needed (pain).   Yes [provider]  doxycycline (VIBRAMYCIN) 100 MG capsule Take 100 mg by mouth 2 (two) times daily. For ten days. 05/26/17  Yes [provider]  lisinopril (PRINIVIL,ZESTRIL) 20 MG tablet Take 1 tablet (20 mg total) by mouth daily. 11/13/14  Yes Croitoru, Mihai, MD  Menthol, Topical Analgesic, (ZIMS MAX-FREEZE) 3.7 % GEL Apply 1 application topically daily as needed (pain).   Yes [provider]  metFORMIN (GLUCOPHAGE) 500 MG tablet Take 500 mg by mouth 2 (two) times daily. 11/15/14  Yes [provider]  methylPREDNISolone (MEDROL DOSEPAK) 4 MG TBPK tablet As directed on package 05/25/17  Yes [provider]  torsemide (DEMADEX) 20 MG tablet Take 20 mg by mouth daily.   Yes [provider]    Family History Family History  Problem Relation Age of Onset  . Cancer Mother        non-hodgkins lymphoma    Social History  Social History  Substance Use Topics  . Smoking status: Former Research scientist (life sciences)  . Smokeless tobacco: Never Used  . Alcohol use No     Allergies   Cardizem [diltiazem hcl]; Clindamycin/lincomycin; and Warfarin and related   Review of Systems Review of Systems  Constitutional: Negative for fever.  Respiratory: Negative for shortness of breath.   Gastrointestinal: Negative for abdominal pain, constipation and vomiting.  Genitourinary: Negative for decreased urine volume, difficulty urinating, dysuria, flank pain and hematuria.  Musculoskeletal: Positive for back pain. Negative for joint swelling.  Skin: Negative for color change and rash.       Tick bites    Neurological: Negative for weakness and numbness.  All other systems reviewed and are negative.    Physical Exam Updated Vital Signs BP (!) 151/75   Pulse 61   Temp 97.7 F (36.5 C) (Oral)   Resp 14   Ht 6\' 1"  (1.854 m)   Wt 81.6 kg (180 lb)   SpO2 96%   BMI 23.75 kg/m   Physical Exam  Constitutional: He is oriented to person, place, and time. He appears well-developed and well-nourished. No distress.  HENT:  Head: Normocephalic and atraumatic.  Mouth/Throat: Oropharynx is clear and moist.  Eyes: EOM are normal. Pupils are equal, round, and reactive to light.  Neck: Normal range of motion. Neck supple.  Cardiovascular: Normal rate, regular rhythm, normal heart sounds and intact distal pulses.   No murmur heard. Pulmonary/Chest: Effort normal and breath sounds normal. No respiratory distress.  Abdominal: Soft. He exhibits no distension. There is no tenderness. There is no rebound and no guarding.  Musculoskeletal: Normal range of motion. He exhibits no edema or tenderness.       Lumbar back: He exhibits pain. He exhibits normal range of motion, no swelling, no deformity, no laceration and normal pulse.  ttp of the right lower lumbar paraspinal muscles and SI joint.  Pt has 5/5 strength against resistance of bilateral lower extremities. Pt has FROM right knee.  No erythema or effusion.   Lymphadenopathy:    He has no cervical adenopathy.  Neurological: He is alert and oriented to person, place, and time. He has normal strength. No sensory deficit. He exhibits normal muscle tone. Coordination and gait normal.  Reflex Scores:      Patellar reflexes are 2+ on the right side and 2+ on the left side.      Achilles reflexes are 2+ on the right side and 2+ on the left side. Skin: Skin is warm and dry. Capillary refill takes less than 2 seconds. No rash noted.  Psychiatric: Judgment normal.  Nursing note and vitals reviewed.    ED Treatments / Results  Labs (all labs ordered  are listed, but only abnormal results are displayed) Labs Reviewed  BASIC METABOLIC PANEL - Abnormal; Notable for the following:       Result Value   Glucose, Bld 133 (*)    BUN 28 (*)    All other components within normal limits  CBC WITH DIFFERENTIAL/PLATELET  URINALYSIS, ROUTINE W REFLEX MICROSCOPIC    EKG  EKG Interpretation None       Radiology Dg Lumbar Spine Complete  Result Date: 05/29/2017 CLINICAL DATA:  Lumbago with radicular symptoms on right EXAM: LUMBAR SPINE - COMPLETE 4+ VIEW COMPARISON:  None. FINDINGS: Frontal and lateral, spot lumbosacral lateral, and bilateral oblique views were obtained. There are 5 non-rib-bearing lumbar type vertebral bodies. There is no fracture or spondylolisthesis. There is moderate disc space  narrowing at L5-S1. There is slight disc space narrowing at L3-4 and L4-5. There is facet osteoarthritic change at L4-5 and L5-S1 bilaterally. There are foci of aortic atherosclerosis. IMPRESSION: Lower lumbar osteoarthritic change. No fracture or spondylolisthesis. Areas of aortic atherosclerosis. Electronically Signed   By: Lowella Grip III M.D.   On: 05/29/2017 12:03   Dg Hip Unilat W Or Wo Pelvis 2-3 Views Right  Result Date: 05/29/2017 CLINICAL DATA:  Pain with radicular symptoms EXAM: DG HIP (WITH OR WITHOUT PELVIS) 2-3V RIGHT COMPARISON:  None. FINDINGS: Frontal pelvis as well as frontal and lateral right hip images were obtained. There is no fracture or dislocation. Joint spaces appear normal. No erosive change. IMPRESSION: No fracture or dislocation.  No evident arthropathy. Electronically Signed   By: Lowella Grip III M.D.   On: 05/29/2017 12:04    Procedures Procedures (including critical care time)  Medications Ordered in ED Medications  oxyCODONE-acetaminophen (PERCOCET/ROXICET) 5-325 MG per tablet 1 tablet (1 tablet Oral Given 05/29/17 1108)     Initial Impression / Assessment and Plan / ED Course  I have reviewed the triage  vital signs and the nursing notes.  Pertinent labs & imaging results that were available during my care of the patient were reviewed by me and considered in my medical decision making (see chart for details).    Pt is currently taking steroid taper and awaiting insurance approval for MRI of his back ordered by his PCP.    Pain likely radicular.  Appears stable for d/c. No focal neuro deficits.  No concerning sx's for emergent neurological process.    Final Clinical Impressions(s) / ED Diagnoses   Final diagnoses:  Acute right-sided low back pain with right-sided sciatica    New Prescriptions New Prescriptions   No medications on file     Bufford Lope 05/31/17 2225    Noemi Chapel, MD 06/02/17 [phone]

## 2017-06-01 ENCOUNTER — Other Ambulatory Visit (HOSPITAL_COMMUNITY): Payer: Self-pay | Admitting: Pulmonary Disease

## 2017-06-01 DIAGNOSIS — M79604 Pain in right leg: Secondary | ICD-10-CM

## 2017-06-04 ENCOUNTER — Ambulatory Visit (HOSPITAL_COMMUNITY)
Admission: RE | Admit: 2017-06-04 | Discharge: 2017-06-04 | Disposition: A | Discharge status: Home / Self Care | Payer: PPO | Source: Ambulatory Visit | Attending: Pulmonary Disease | Admitting: Pulmonary Disease

## 2017-06-04 DIAGNOSIS — M5126 Other intervertebral disc displacement, lumbar region: Secondary | ICD-10-CM | POA: Insufficient documentation

## 2017-06-04 DIAGNOSIS — M48061 Spinal stenosis, lumbar region without neurogenic claudication: Secondary | ICD-10-CM | POA: Diagnosis not present

## 2017-06-04 DIAGNOSIS — M79604 Pain in right leg: Secondary | ICD-10-CM | POA: Diagnosis not present

## 2017-06-04 DIAGNOSIS — M545 Low back pain: Secondary | ICD-10-CM | POA: Diagnosis not present

## 2017-06-08 ENCOUNTER — Ambulatory Visit (HOSPITAL_COMMUNITY): Payer: PPO

## 2017-06-10 ENCOUNTER — Other Ambulatory Visit (HOSPITAL_COMMUNITY): Payer: PPO

## 2017-06-15 DIAGNOSIS — M545 Low back pain: Secondary | ICD-10-CM | POA: Diagnosis not present

## 2017-06-15 DIAGNOSIS — I10 Essential (primary) hypertension: Secondary | ICD-10-CM | POA: Diagnosis not present

## 2017-06-15 DIAGNOSIS — E119 Type 2 diabetes mellitus without complications: Secondary | ICD-10-CM | POA: Diagnosis not present

## 2017-06-15 DIAGNOSIS — I4891 Unspecified atrial fibrillation: Secondary | ICD-10-CM | POA: Diagnosis not present

## 2017-07-14 DIAGNOSIS — M5416 Radiculopathy, lumbar region: Secondary | ICD-10-CM | POA: Diagnosis not present

## 2017-07-14 DIAGNOSIS — I10 Essential (primary) hypertension: Secondary | ICD-10-CM | POA: Diagnosis not present

## 2017-07-14 DIAGNOSIS — M5126 Other intervertebral disc displacement, lumbar region: Secondary | ICD-10-CM | POA: Diagnosis not present

## 2017-08-12 DIAGNOSIS — M5126 Other intervertebral disc displacement, lumbar region: Secondary | ICD-10-CM | POA: Diagnosis not present

## 2017-09-14 ENCOUNTER — Encounter: Payer: Self-pay | Admitting: Cardiovascular Disease

## 2017-09-14 ENCOUNTER — Ambulatory Visit (INDEPENDENT_AMBULATORY_CARE_PROVIDER_SITE_OTHER): Payer: PPO | Admitting: Cardiovascular Disease

## 2017-09-14 VITALS — BP 110/70 | HR 85 | Ht 73.0 in | Wt 183.0 lb

## 2017-09-14 DIAGNOSIS — Z9889 Other specified postprocedural states: Secondary | ICD-10-CM | POA: Diagnosis not present

## 2017-09-14 DIAGNOSIS — I484 Atypical atrial flutter: Secondary | ICD-10-CM

## 2017-09-14 DIAGNOSIS — Z7901 Long term (current) use of anticoagulants: Secondary | ICD-10-CM

## 2017-09-14 DIAGNOSIS — I10 Essential (primary) hypertension: Secondary | ICD-10-CM

## 2017-09-14 DIAGNOSIS — E119 Type 2 diabetes mellitus without complications: Secondary | ICD-10-CM | POA: Diagnosis not present

## 2017-09-14 DIAGNOSIS — I471 Supraventricular tachycardia: Secondary | ICD-10-CM

## 2017-09-14 MED ORDER — RIVAROXABAN 20 MG PO TABS: 20.0000 mg | ORAL_TABLET | Freq: Every day | ORAL | 5 refills | Status: DC

## 2017-09-14 NOTE — Patient Instructions (Signed)
Your physician has recommended you make the following change in your medication:  1. START Xarelto 20 mg - take 1 tablet by mouth daily with a meal  Dr Sallyanne Kuster recommends that you schedule a follow-up appointment in 6 months. You will receive a reminder letter in the mail two months in advance. If you don't receive a letter, please call our office to schedule the follow-up appointment.  If you need a refill on your cardiac medications before your next appointment, please call your pharmacy.

## 2017-09-14 NOTE — Progress Notes (Signed)
Cardiology Office Note    Date:  09/15/2017   ID:  Jacob [name], DOB 13-Jan-1951, MRN [id number]  PCP:  [name], MD  Cardiologist:   [name], MD   Chief Complaint  Patient presents with  . Follow-up    History of Present Illness:  Jacob [name] is a 66 y.o. male who presents for follow up for atrial arrhythmia and history of MV replacement.  He's had a lot of problems with back pain which has limited his ability to be physically active. He apologizes for gaining weight but is still fairly lean. He continues have occasional problems with palpitations. He has noticed that heat often triggers these. He had a 1 hour episode a few days ago after trauma work out in the yard. That was his first episode in about 3 months, but these usually occur every month or two. When he has the palpitations he feels generally unwell, but denies frank dyspnea or angina and has never experienced syncope.  In January 2016, he developed very rapid atrial tachycardia (scar related left atrial flutter?) at about 220 bpm during exercise testing. This is likely the cause of his intermittent palpitations. 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 He has been poorly tolerant to multiple beta blockers (metoprolol and carvedilol cause fatigue, nebivolol made him to "always been a bad mood").  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 At that time his left atrial appendage was oversewn and a patent foramen ovale was closed. Preoperative cardiac catheterization showed noncritical coronary atherosclerosis. Repeat cardiac catheterization in 2011 again showed minimal irregularities, the worst lesion being a 40% stenosis in the mid right coronary artery. He has had a couple of normal nuclear  stress tests, most recently in 2009 and normal treadmill stress test most recently January 2016.   He also has a history of hypertension and dyslipidemia and weight gain related diabetes mellitus type 2. He quit smoking in 1986.    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 He cannot wear an event monitor since he is allergic to the adhesive pads. He was intolerant to metoprolol and carvedilol (fatigue). Bystolic caused excessive irritability.  Echo performed at [facility] in April 2017 showed trace mitral regurgitation normal left ventricular ejection fraction, normal left atrial size. Images are not available for review    Past Medical History:  Diagnosis Date  . A-fib ([place])   . CAD (coronary artery disease)   . Dyslipidemia   . Gout   . Hypertension   . 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 CARDIAC CATHETERIZATION  12/03/04   sign. one vessel disease,severe MR  . CARDIAC CATHETERIZATION  01/19/06   normal  . CARDIAC CATHETERIZATION  06/06/10   mild nonobstructive CAD  . cyst  1980   removed from wrist  . MITRAL VALVE SURGERY  12/11/04   MV repair with Oletta Lamas ring,closure of PFO  . RADIOFREQUENCY ABLATION  12/02/04   Dr. Emelda Brothers  . SALIVARY GLAND SURGERY  2004   growth  .  TONSILLECTOMY  1959    Outpatient Medications Prior to Visit  Medication Sig Dispense Refill  . acetaminophen (TYLENOL) 650 MG CR tablet Take 1,300 mg by mouth every 6 (six) hours as needed for pain.    Marland Kitchen lisinopril (PRINIVIL,ZESTRIL) 20 MG tablet Take 1 tablet (20 mg total) by mouth daily. 15 tablet 0  . metFORMIN (GLUCOPHAGE) 500 MG tablet Take 500 mg by mouth 2 (two) times daily.  11  . torsemide (DEMADEX) 20 MG tablet Take 20  mg by mouth daily.    Marland Kitchen oxyCODONE-acetaminophen (PERCOCET/ROXICET) 5-325 MG tablet Take 1 tablet by mouth every 4 (four) hours as needed. (Patient not taking: Reported on 09/14/2017) 15 tablet 0  . Aspirin-Caffeine (BAYER BACK & BODY) 500-32.5 MG TABS Take 2 tablets by mouth every 6 (six) hours as needed (pain).    Marland Kitchen doxycycline (VIBRAMYCIN) 100 MG capsule Take 100 mg by mouth 2 (two) times daily. For ten days.    . Menthol, Topical Analgesic, (ZIMS MAX-FREEZE) 3.7 % GEL Apply 1 application topically daily as needed (pain).    . methocarbamol (ROBAXIN) 500 MG tablet Take 1 tablet (500 mg total) by mouth 3 (three) times daily. (Patient not taking: Reported on 09/14/2017) 21 tablet 0  . methylPREDNISolone (MEDROL DOSEPAK) 4 MG TBPK tablet As directed on package     No facility-administered medications prior to visit.      Allergies:   Cardizem [diltiazem hcl]; Clindamycin/lincomycin; and Warfarin and related   Social History   Social History  . Marital status: Married    Spouse name: N/A  . Number of children: N/A  . Years of education: N/A   Social History Main Topics  . Smoking status: Former Research scientist (life sciences)  . Smokeless tobacco: Never Used  . Alcohol use No  . Drug use: No  . Sexual activity: Not Asked   Other Topics Concern  . None   Social History Narrative  . None     Family History:  The patient's family history includes Cancer in his mother.   ROS:   Please see the history of present illness.    ROS All other systems reviewed and are negative.   PHYSICAL EXAM:   VS:  BP 110/70   Pulse 85   Ht 6\' 1"  (1.854 m)   Wt 183 lb (83 kg)   BMI 24.14 kg/m     General: Alert, oriented x3, no distress, lean Head: no evidence of trauma, PERRL, EOMI, no exophtalmos or lid lag, no myxedema, no xanthelasma; normal ears, nose and oropharynx Neck: normal jugular venous pulsations and no hepatojugular reflux; brisk carotid pulses without delay and no carotid bruits Chest: clear to  auscultation, no signs of consolidation by percussion or palpation, normal fremitus, symmetrical and full respiratory excursions Cardiovascular: normal position and quality of the apical impulse, regular rhythm, normal first and second heart sounds, no murmurs, rubs or gallops Abdomen: no tenderness or distention, no masses by palpation, no abnormal pulsatility or arterial bruits, normal bowel sounds, no hepatosplenomegaly Extremities: no clubbing, cyanosis or edema; 2+ radial, ulnar and brachial pulses bilaterally; 2+ right femoral, posterior tibial and dorsalis pedis pulses; 2+ left femoral, posterior tibial and dorsalis pedis pulses; no subclavian or femoral bruits Neurological: grossly nonfocal Psych: Normal mood and affect   Wt Readings from Last 3 Encounters:  09/14/17 183 lb (83 kg)  05/29/17 180 lb (81.6 kg)  07/28/16 189 lb (85.7 kg)      Studies/Labs Reviewed:   EKG:  EKG is ordered  today.  Shows normal sinus rhythm, normal tracing, QTC 433 ms  ASSESSMENT:    1. Atypical atrial flutter ([place])   2. Long term current use of anticoagulant   3. AVNRT (AV nodal re-entry tachycardia) ([place])   4. Status post mitral valve repair   5. Essential hypertension   6. Diabetes mellitus type 2 in nonobese [facility])      PLAN:  In order of problems listed above:   1. Atypical atrial flutter: Suspect this is the reason for his recurrent palpitations and suspect that it is likely related to atriotomy scar. He did not tolerate multiple beta blockers and had an allergic reaction to diltiazem. I will recommend that he consider referral to electrophysiology to discuss an attempt at an ablation procedure versus treatment with flecainide . It would be very useful to catch the arrhythmia and the Actos. He had an allergic skin reaction to the adhesive pads on our event monitors and does not want to try that again. I suggested he purchase commercially available event monitor such as the [place]. CHADSVasc 3  (Age, HTN, DM).  2. Xarelto: Recommend that he start anticoagulation to reduce risk of embolic stroke. Reviewed the pros and cons of normal on anticoagulants versus vitamin K antagonists. Discussed the risk of bleeding complications with either approach. He is worried about the cost of Xarelto, but is willing to give it a try. 3. History of AVNRT: s/p AV node slow pathway ablation  4. S/P mitral annuloplasty repair: normal findings on echo 2014 and in April 2017, trivial MR 5. HTN: Excellent blood pressure control 6. DM: On metformin monotherapy with good control.   Medication Adjustments/Labs and Tests Ordered: Current medicines are reviewed at length with the patient today.  Concerns regarding medicines are outlined above.  Medication changes, Labs and Tests ordered today are listed in the Patient Instructions below. Patient Instructions  Your physician has recommended you make the following change in your medication:  1. START Xarelto 20 mg - take 1 tablet by mouth daily with a meal  Dr Sallyanne Kuster recommends that you schedule a follow-up appointment in 6 months. You will receive a reminder letter in the mail two months in advance. If you don't receive a letter, please call our office to schedule the follow-up appointment.  If you need a refill on your cardiac medications before your next appointment, please call your pharmacy.      Signed, [name], MD  09/15/2017 8:52 AM    [facility] [place], [place], [place]  [address] Phone: [phone]; Fax: [phone]

## 2017-10-16 DIAGNOSIS — H01025 Squamous blepharitis left lower eyelid: Secondary | ICD-10-CM | POA: Diagnosis not present

## 2017-10-16 DIAGNOSIS — H04123 Dry eye syndrome of bilateral lacrimal glands: Secondary | ICD-10-CM | POA: Diagnosis not present

## 2017-10-16 DIAGNOSIS — H40013 Open angle with borderline findings, low risk, bilateral: Secondary | ICD-10-CM | POA: Diagnosis not present

## 2017-10-16 DIAGNOSIS — E119 Type 2 diabetes mellitus without complications: Secondary | ICD-10-CM | POA: Diagnosis not present

## 2017-10-16 DIAGNOSIS — H2513 Age-related nuclear cataract, bilateral: Secondary | ICD-10-CM | POA: Diagnosis not present

## 2017-10-16 DIAGNOSIS — H01021 Squamous blepharitis right upper eyelid: Secondary | ICD-10-CM | POA: Diagnosis not present

## 2017-10-16 DIAGNOSIS — H01024 Squamous blepharitis left upper eyelid: Secondary | ICD-10-CM | POA: Diagnosis not present

## 2017-10-16 DIAGNOSIS — H01022 Squamous blepharitis right lower eyelid: Secondary | ICD-10-CM | POA: Diagnosis not present

## 2017-10-16 DIAGNOSIS — H02831 Dermatochalasis of right upper eyelid: Secondary | ICD-10-CM | POA: Diagnosis not present

## 2017-10-16 DIAGNOSIS — H02834 Dermatochalasis of left upper eyelid: Secondary | ICD-10-CM | POA: Diagnosis not present
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
## 2017-10-28 ENCOUNTER — Ambulatory Visit (INDEPENDENT_AMBULATORY_CARE_PROVIDER_SITE_OTHER): Payer: PPO | Admitting: Ophthalmology

## 2017-10-28 ENCOUNTER — Encounter (INDEPENDENT_AMBULATORY_CARE_PROVIDER_SITE_OTHER): Payer: Self-pay | Admitting: Ophthalmology

## 2017-10-28 DIAGNOSIS — H2513 Age-related nuclear cataract, bilateral: Secondary | ICD-10-CM | POA: Diagnosis not present

## 2017-10-28 DIAGNOSIS — H43823 Vitreomacular adhesion, bilateral: Secondary | ICD-10-CM | POA: Diagnosis not present

## 2017-10-28 DIAGNOSIS — E119 Type 2 diabetes mellitus without complications: Secondary | ICD-10-CM

## 2017-10-28 DIAGNOSIS — H3581 Retinal edema: Secondary | ICD-10-CM | POA: Diagnosis not present

## 2017-10-28 DIAGNOSIS — H35033 Hypertensive retinopathy, bilateral: Secondary | ICD-10-CM

## 2017-10-28 DIAGNOSIS — H3561 Retinal hemorrhage, right eye: Secondary | ICD-10-CM | POA: Diagnosis not present

## 2017-11-17 DIAGNOSIS — R5383 Other fatigue: Secondary | ICD-10-CM | POA: Diagnosis not present

## 2017-11-17 DIAGNOSIS — E785 Hyperlipidemia, unspecified: Secondary | ICD-10-CM | POA: Diagnosis not present

## 2017-11-17 DIAGNOSIS — I10 Essential (primary) hypertension: Secondary | ICD-10-CM | POA: Diagnosis not present

## 2017-11-17 DIAGNOSIS — E119 Type 2 diabetes mellitus without complications: Secondary | ICD-10-CM | POA: Diagnosis not present
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
## 2017-11-25 ENCOUNTER — Ambulatory Visit (INDEPENDENT_AMBULATORY_CARE_PROVIDER_SITE_OTHER): Payer: PPO | Admitting: Ophthalmology

## 2017-11-25 ENCOUNTER — Encounter (INDEPENDENT_AMBULATORY_CARE_PROVIDER_SITE_OTHER): Payer: Self-pay | Admitting: Ophthalmology

## 2017-11-25 DIAGNOSIS — H2513 Age-related nuclear cataract, bilateral: Secondary | ICD-10-CM

## 2017-11-25 DIAGNOSIS — E119 Type 2 diabetes mellitus without complications: Secondary | ICD-10-CM

## 2017-11-25 DIAGNOSIS — Q141 Congenital malformation of retina: Secondary | ICD-10-CM

## 2017-11-25 DIAGNOSIS — H35033 Hypertensive retinopathy, bilateral: Secondary | ICD-10-CM

## 2017-11-25 DIAGNOSIS — H43823 Vitreomacular adhesion, bilateral: Secondary | ICD-10-CM

## 2017-11-25 DIAGNOSIS — H3561 Retinal hemorrhage, right eye: Secondary | ICD-10-CM | POA: Diagnosis not present

## 2017-11-25 DIAGNOSIS — H3581 Retinal edema: Secondary | ICD-10-CM
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
## 2017-12-09 ENCOUNTER — Ambulatory Visit (INDEPENDENT_AMBULATORY_CARE_PROVIDER_SITE_OTHER): Payer: PPO | Admitting: Ophthalmology

## 2017-12-09 ENCOUNTER — Encounter (INDEPENDENT_AMBULATORY_CARE_PROVIDER_SITE_OTHER): Payer: Self-pay | Admitting: Ophthalmology

## 2017-12-09 DIAGNOSIS — Q141 Congenital malformation of retina: Secondary | ICD-10-CM

## 2017-12-09 DIAGNOSIS — H3589 Other specified retinal disorders: Secondary | ICD-10-CM

## 2017-12-09 DIAGNOSIS — H3581 Retinal edema: Secondary | ICD-10-CM

## 2017-12-09 DIAGNOSIS — E119 Type 2 diabetes mellitus without complications: Secondary | ICD-10-CM

## 2017-12-09 DIAGNOSIS — H35033 Hypertensive retinopathy, bilateral: Secondary | ICD-10-CM

## 2017-12-09 DIAGNOSIS — H43823 Vitreomacular adhesion, bilateral: Secondary | ICD-10-CM

## 2017-12-09 DIAGNOSIS — H3561 Retinal hemorrhage, right eye: Secondary | ICD-10-CM

## 2017-12-09 DIAGNOSIS — H2513 Age-related nuclear cataract, bilateral: Secondary | ICD-10-CM

## 2017-12-09 MED ORDER — PREDNISOLONE ACETATE 1 % OP SUSP: 1.0000 [drp] | Freq: Four times a day (QID) | OPHTHALMIC | 0 refills | Status: AC

## 2017-12-10 NOTE — Progress Notes (Addendum)
This document serves as a record of services personally performed by Gardiner Sleeper, MD, PhD. It was created on their behalf by Catha Brow, [place], a certified ophthalmic assistant. The creation of this record is the provider's dictation and/or activities during the visit.  Electronically signed by: Catha Brow, [place]  12/10/17 1:14 PM   I have reviewed the above documentation for accuracy and completeness, and I agree with the above. Gardiner Sleeper, M.D., Ph.D. 12/13/17 12:23 AM

## 2017-12-24 DIAGNOSIS — I4891 Unspecified atrial fibrillation: Secondary | ICD-10-CM | POA: Diagnosis not present

## 2017-12-24 DIAGNOSIS — I10 Essential (primary) hypertension: Secondary | ICD-10-CM | POA: Diagnosis not present

## 2017-12-24 DIAGNOSIS — E119 Type 2 diabetes mellitus without complications: Secondary | ICD-10-CM | POA: Diagnosis not present

## 2018-01-05 DIAGNOSIS — M5126 Other intervertebral disc displacement, lumbar region: Secondary | ICD-10-CM | POA: Diagnosis not present
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
## 2018-01-06 ENCOUNTER — Encounter (INDEPENDENT_AMBULATORY_CARE_PROVIDER_SITE_OTHER): Payer: Self-pay | Admitting: Ophthalmology

## 2018-01-06 ENCOUNTER — Ambulatory Visit (INDEPENDENT_AMBULATORY_CARE_PROVIDER_SITE_OTHER): Payer: PPO | Admitting: Ophthalmology

## 2018-01-06 DIAGNOSIS — H3589 Other specified retinal disorders: Secondary | ICD-10-CM

## 2018-01-06 DIAGNOSIS — Q141 Congenital malformation of retina: Secondary | ICD-10-CM

## 2018-01-06 DIAGNOSIS — H35033 Hypertensive retinopathy, bilateral: Secondary | ICD-10-CM

## 2018-01-06 DIAGNOSIS — H3561 Retinal hemorrhage, right eye: Secondary | ICD-10-CM

## 2018-01-06 DIAGNOSIS — H2513 Age-related nuclear cataract, bilateral: Secondary | ICD-10-CM

## 2018-01-06 DIAGNOSIS — H43823 Vitreomacular adhesion, bilateral: Secondary | ICD-10-CM

## 2018-01-06 DIAGNOSIS — E119 Type 2 diabetes mellitus without complications: Secondary | ICD-10-CM

## 2018-01-06 DIAGNOSIS — H3581 Retinal edema: Secondary | ICD-10-CM
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
## 2018-02-03 ENCOUNTER — Ambulatory Visit (INDEPENDENT_AMBULATORY_CARE_PROVIDER_SITE_OTHER): Payer: PPO | Admitting: Ophthalmology

## 2018-02-03 ENCOUNTER — Encounter (INDEPENDENT_AMBULATORY_CARE_PROVIDER_SITE_OTHER): Payer: Self-pay | Admitting: Ophthalmology

## 2018-02-03 DIAGNOSIS — H35033 Hypertensive retinopathy, bilateral: Secondary | ICD-10-CM

## 2018-02-03 DIAGNOSIS — E119 Type 2 diabetes mellitus without complications: Secondary | ICD-10-CM

## 2018-02-03 DIAGNOSIS — Q141 Congenital malformation of retina: Secondary | ICD-10-CM

## 2018-02-03 DIAGNOSIS — H3581 Retinal edema: Secondary | ICD-10-CM

## 2018-02-03 DIAGNOSIS — H43823 Vitreomacular adhesion, bilateral: Secondary | ICD-10-CM

## 2018-02-03 DIAGNOSIS — H2513 Age-related nuclear cataract, bilateral: Secondary | ICD-10-CM

## 2018-02-03 DIAGNOSIS — H3561 Retinal hemorrhage, right eye: Secondary | ICD-10-CM

## 2018-02-03 DIAGNOSIS — H3589 Other specified retinal disorders: Secondary | ICD-10-CM

## 2018-02-04 ENCOUNTER — Telehealth: Payer: Self-pay | Admitting: Cardiovascular Disease

## 2018-02-04 NOTE — Telephone Encounter (Signed)
New message  Pt verbalized that he is calling for the RN  He want a referral to see someone in [place] to see about Heart Rates

## 2018-02-04 NOTE — Telephone Encounter (Signed)
Spoke with pt, he is having more problems with his heart beating really fast with activity and he is tired after episodes. He reports it is happening more often and dr c had mentioned to him about seeing someone in the [place] office about the fast heart rates. He is ready for that referral, will forward to dr croitoru to review and advise

## 2018-02-04 NOTE — Telephone Encounter (Signed)
I believe Dr. Rayann Heman has [facility] EP clinic.

## 2018-02-04 NOTE — Telephone Encounter (Signed)
Spoke with pt, aware message has been sent to the EP scheduler and they will contact him with an appointment

## 2018-02-15 ENCOUNTER — Institutional Professional Consult (permissible substitution): Payer: PPO | Admitting: Internal Medicine

## 2018-03-30 ENCOUNTER — Encounter: Payer: Self-pay | Admitting: Cardiovascular Disease

## 2018-03-30 ENCOUNTER — Ambulatory Visit: Payer: PPO | Admitting: Cardiovascular Disease

## 2018-03-30 VITALS — BP 126/90 | HR 76 | Ht 73.0 in | Wt 182.0 lb

## 2018-03-30 DIAGNOSIS — I10 Essential (primary) hypertension: Secondary | ICD-10-CM

## 2018-03-30 DIAGNOSIS — Z9889 Other specified postprocedural states: Secondary | ICD-10-CM | POA: Diagnosis not present

## 2018-03-30 DIAGNOSIS — I484 Atypical atrial flutter: Secondary | ICD-10-CM

## 2018-03-30 DIAGNOSIS — I471 Supraventricular tachycardia: Secondary | ICD-10-CM | POA: Diagnosis not present

## 2018-03-30 DIAGNOSIS — E119 Type 2 diabetes mellitus without complications: Secondary | ICD-10-CM

## 2018-03-30 NOTE — Progress Notes (Signed)
Cardiology Office Note    Date:  04/04/2018   ID:  Jacob [name], DOB Apr 30, 1951, MRN [id number]  PCP:  [name], MD  Cardiologist:   [name], MD   Chief Complaint  Patient presents with  . Follow-up    pt denied chest pain    History of Present Illness:  Jacob [name] is a 67 y.o. male who presents for follow up for atrial arrhythmia and history of MV repair.  He continues to be very active.  He walks every day.  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  He continues to be troubled by palpitations.  These occur 2 or 3 times a month and generally last for 2 or 3 hours.  Occasionally they cause dizziness but that will only last for about 3-4 seconds.  Mostly the palpitations are associated with severe fatigue and he has to go down and lie down and rest.  He has noticed some degree of increased frequency of the palpitations with intense physical activity, but this is not consistent.  In January 2016, he developed very rapid atrial tachycardia (scar related left atrial flutter?) at about 220 bpm during exercise testing. This is likely the cause of his intermittent palpitations. 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 He has been poorly tolerant to multiple beta blockers (metoprolol and carvedilol cause fatigue, nebivolol made him to "always be in a bad mood").  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 At that time his left atrial appendage was oversewn and a patent foramen ovale was closed. Preoperative cardiac catheterization showed noncritical coronary atherosclerosis. Repeat cardiac catheterization in 2011 again showed minimal irregularities, the worst lesion being a  40% stenosis in the mid right coronary artery. He has had a couple of normal nuclear stress tests, most recently in 2009 and normal treadmill stress test most recently January 2016.   He also has a history of hypertension and dyslipidemia and weight gain related diabetes mellitus type 2. He quit smoking in 1986.    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 He cannot wear an event monitor since he is allergic to the adhesive pads. He was intolerant to metoprolol and carvedilol (fatigue). Bystolic caused excessive irritability.  Echo performed at [facility] in April 2017 showed trace mitral regurgitation normal left ventricular ejection fraction, normal left atrial size. Images are not available for review    Past Medical History:  Diagnosis Date  . A-fib ([place])   . CAD (coronary artery disease)   . Dyslipidemia   . Gout   . Hypertension   . 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 CARDIAC CATHETERIZATION  12/03/04   sign. one vessel disease,severe MR  . CARDIAC CATHETERIZATION  01/19/06   normal  . CARDIAC CATHETERIZATION  06/06/10   mild nonobstructive CAD  . cyst  1980   removed from wrist  . MITRAL VALVE SURGERY  12/11/04   MV  repair with Edwards ring,closure of PFO  . RADIOFREQUENCY ABLATION  12/02/04   Dr. Emelda Brothers  . SALIVARY GLAND SURGERY  2004   growth  . TONSILLECTOMY  1959    Outpatient Medications Prior to Visit  Medication Sig Dispense Refill  . acetaminophen (TYLENOL) 650 MG CR tablet Take 1,300 mg by mouth every 6 (six) hours as needed for pain.    Marland Kitchen lisinopril (PRINIVIL,ZESTRIL) 20 MG tablet Take 1 tablet (20 mg total) by mouth daily. 15 tablet 0  . metFORMIN (GLUCOPHAGE) 500 MG tablet Take  500 mg by mouth 2 (two) times daily.  11  . torsemide (DEMADEX) 20 MG tablet Take 20 mg by mouth daily.    Marland Kitchen oxyCODONE-acetaminophen (PERCOCET/ROXICET) 5-325 MG tablet Take 1 tablet by mouth every 4 (four) hours as needed. 15 tablet 0  . rivaroxaban (XARELTO) 20 MG TABS tablet Take 1 tablet (20 mg total) by mouth daily with supper. 30 tablet 5   No facility-administered medications prior to visit.      Allergies:   Cardizem [diltiazem hcl]; Clindamycin/lincomycin; and Warfarin and related   Social History   Socioeconomic History  . Marital status: Married    Spouse name: Not on file  . Number of children: Not on file  . Years of education: Not on file  . Highest education level: Not on file  Occupational History  . Not on file  Social Needs  . Financial resource strain: Not on file  . Food insecurity:    Worry: Not on file    Inability: Not on file  . Transportation needs:    Medical: Not on file    Non-medical: Not on file  Tobacco Use  . Smoking status: Former Research scientist (life sciences)  . Smokeless tobacco: Never Used  Substance and Sexual Activity  . Alcohol use: No  . Drug use: No  . Sexual activity: Not on file  Lifestyle  . Physical activity:    Days per week: Not on file    Minutes per session: Not on file  . Stress: Not on file  Relationships  . Social connections:    Talks on phone: Not on file    Gets together: Not on file    Attends religious service: Not on file    Active member of club or organization: Not on file    Attends meetings of clubs or organizations: Not on file    Relationship status: Not on file  Other Topics Concern  . Not on file  Social History Narrative  . Not on file     Family History:  The patient's family history includes Cancer in his mother.   ROS:   Please see the history of present illness.    ROS All other systems reviewed and are negative.   PHYSICAL EXAM:   VS:  BP 126/90   Pulse 76   Ht 6\' 1"  (1.854 m)   Wt 182 lb (82.6 kg)    BMI 24.01 kg/m      General: Alert, oriented x3, no distress, lean and fit Head: no evidence of trauma, PERRL, EOMI, no exophtalmos or lid lag, no myxedema, no xanthelasma; normal ears, nose and oropharynx Neck: normal jugular venous pulsations and no hepatojugular reflux; brisk carotid pulses without delay and no carotid bruits Chest: clear to auscultation, no signs of consolidation by percussion or palpation, normal fremitus, symmetrical and full respiratory excursions Cardiovascular: normal position and quality of the apical impulse, regular rhythm, normal first and second heart sounds, no murmurs,  rubs or gallops Abdomen: no tenderness or distention, no masses by palpation, no abnormal pulsatility or arterial bruits, normal bowel sounds, no hepatosplenomegaly Extremities: no clubbing, cyanosis or edema; 2+ radial, ulnar and brachial pulses bilaterally; 2+ right femoral, posterior tibial and dorsalis pedis pulses; 2+ left femoral, posterior tibial and dorsalis pedis pulses; no subclavian or femoral bruits Neurological: grossly nonfocal Psych: Normal mood and affect   Wt Readings from Last 3 Encounters:  03/30/18 182 lb (82.6 kg)  09/14/17 183 lb (83 kg)  05/29/17 180 lb (81.6 kg)      Studies/Labs Reviewed:   EKG:  EKG is ordered today: normal tracing, sinus rhythm, QTC 443 ms  ASSESSMENT:    1. Atypical atrial flutter ([place])   2. AVNRT (AV nodal re-entry tachycardia) ([place])   3. H/O mitral valve repair   4. Essential hypertension   5. Diabetes mellitus type 2 in nonobese [facility])      PLAN:  In order of problems listed above:   1. Atypical atrial flutter: See treadmill stress test ECG tracings from 2017.  Attempts to catch the arrhythmia again with a wearable event monitor were unsuccessful since he developed an allergic response to the pads.  Most likely has scar reentry flutter in the left atrium.  We have never documented atrial fibrillation.  It has been bothering him more  and more.  Discussed the option for antiarrhythmic therapy versus ablation.  Would like to refer him to see an electrophysiologist.  CHADSVasc 3 (Age, HTN, DM).  We started anticoagulants last September, but he developed a retinal hemorrhage.  Successful flutter ablation would be a great solution to the duration.  Having said that, he did have his left atrial appendage oversewn he should be at lower embolic risk. 2. 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 S/P mitral annuloplasty repair: normal findings on echo 2014 and in April 2017, trivial MR. left atrial appendage was oversewn and PFO close at the time of surgery. 4. HTN: Good blood pressure control 5. DM: On metformin monotherapy with good control.  He all but cured his diabetes with weight loss and exercise.   Medication Adjustments/Labs and Tests Ordered: Current medicines are reviewed at length with the patient today.  Concerns regarding medicines are outlined above.  Medication changes, Labs and Tests ordered today are listed in the Patient Instructions below. Patient Instructions  Medication Instructions:  Continue current medication  If you need a refill on your cardiac medications before your next appointment, please call your pharmacy.  Labwork: None Ordered   Testing/Procedures: None Ordered  Follow-Up: Your physician recommends that you schedule a follow-up appointment in: Dr Rayann Heman in [place] wants you to follow-up in: 1 Year with Dr Sallyanne Kuster You should receive a reminder letter in the mail two months in advance. If you do not receive a letter, please call our office [phone].    Thank you for choosing [facility] at [facility]!!           Signed, [name], MD  04/04/2018 5:52 PM    [address], [place], [place]  [address] Phone: [phone]; Fax: [phone]

## 2018-03-30 NOTE — Patient Instructions (Addendum)
Medication Instructions:  Continue current medication  If you need a refill on your cardiac medications before your next appointment, please call your pharmacy.  Labwork: None Ordered   Testing/Procedures: None Ordered  Follow-Up: Your physician recommends that you schedule a follow-up appointment in: Dr Rayann Heman in [place] wants you to follow-up in: 1 Year with Dr Sallyanne Kuster You should receive a reminder letter in the mail two months in advance. If you do not receive a letter, please call our office [phone].    Thank you for choosing [facility] at [facility]!!

## 2018-04-16 ENCOUNTER — Encounter: Payer: Self-pay | Admitting: Internal Medicine

## 2018-04-29 DIAGNOSIS — Z0000 Encounter for general adult medical examination without abnormal findings: Secondary | ICD-10-CM | POA: Diagnosis not present

## 2018-04-30 DIAGNOSIS — M7989 Other specified soft tissue disorders: Secondary | ICD-10-CM | POA: Diagnosis not present

## 2018-04-30 DIAGNOSIS — E785 Hyperlipidemia, unspecified: Secondary | ICD-10-CM | POA: Diagnosis not present

## 2018-04-30 DIAGNOSIS — I10 Essential (primary) hypertension: Secondary | ICD-10-CM | POA: Diagnosis not present

## 2018-04-30 DIAGNOSIS — E119 Type 2 diabetes mellitus without complications: Secondary | ICD-10-CM | POA: Diagnosis not present

## 2018-05-05 ENCOUNTER — Encounter: Payer: Self-pay | Admitting: Internal Medicine

## 2018-05-05 ENCOUNTER — Ambulatory Visit: Payer: PPO | Admitting: Internal Medicine

## 2018-05-05 VITALS — BP 126/72 | HR 79 | Ht 73.0 in | Wt 184.0 lb

## 2018-05-05 DIAGNOSIS — I471 Supraventricular tachycardia: Secondary | ICD-10-CM | POA: Diagnosis not present

## 2018-05-05 DIAGNOSIS — Z9889 Other specified postprocedural states: Secondary | ICD-10-CM | POA: Diagnosis not present

## 2018-05-05 DIAGNOSIS — R002 Palpitations: Secondary | ICD-10-CM

## 2018-05-05 DIAGNOSIS — I10 Essential (primary) hypertension: Secondary | ICD-10-CM

## 2018-05-05 DIAGNOSIS — R5383 Other fatigue: Secondary | ICD-10-CM | POA: Diagnosis not present

## 2018-05-05 DIAGNOSIS — I484 Atypical atrial flutter: Secondary | ICD-10-CM | POA: Diagnosis not present

## 2018-05-05 NOTE — Progress Notes (Signed)
Thanks, he always thinks a lot about everything MCr

## 2018-05-05 NOTE — Progress Notes (Signed)
Electrophysiology Office Note   Date:  05/05/2018   ID:  Jacob [name], DOB [birth year], MRN [id number]  PCP:  Jacob Du, MD  Cardiologist:  Jacob [name] Primary Electrophysiologist: Jacob Grayer, MD    CC: atrial arrhythmias   History of Present Illness: Jacob [name] is a 67 y.o. male who presents today for electrophysiology evaluation.   The patient is referred by Jacob [name] for EP consultation regarding his atrial arrhythmias.  He is s/p MV repair in 2005 for severe MR secondary to prolapse of the posterior leafless.  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 He has frequent palpitations (several times per month), lasting several hours.  He reports associated fatigue and dizziness.  Of note, 1/16, he had atach at 220 bpm during exercise testing.  He is also s/p prior slow pathway modification for AVNRT in 2005. He is currently not anticoagulated due to prior retinal hemorrhage.  I do not see that he has had prior afib.  He has been said to have atypical atrial flutter. He finds that episodes occur with exertion.  He describes SOB and palpitations.  He has found a couple times that coughing will terminate episodes. He has tried diltiazem and metoprolol but did not tolerate these due to irritability. Today, he denies symptoms of palpitations, chest pain, shortness of breath, orthopnea, PND, lower extremity edema, claudication, dizziness, presyncope, syncope, bleeding, or neurologic sequela. The patient is tolerating medications without difficulties and is otherwise without complaint today.    Past Medical History:  Diagnosis Date  . A-fib ([place])   . CAD (coronary artery disease)   . Dyslipidemia   . Gout   . Hypertension   . 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 CARDIAC CATHETERIZATION  12/03/04   sign. one vessel  disease,severe MR  . CARDIAC CATHETERIZATION  01/19/06   normal  . CARDIAC CATHETERIZATION  06/06/10   mild nonobstructive CAD  . cyst  1980   removed from wrist  . MITRAL VALVE SURGERY  12/11/04   MV repair with Jacob [name] ring,closure of PFO  . RADIOFREQUENCY ABLATION  12/02/04   Jacob [name]  . SALIVARY GLAND SURGERY  2004   growth  . TONSILLECTOMY  1959     Current Outpatient Medications  Medication Sig Dispense Refill  . lisinopril (PRINIVIL,ZESTRIL) 20 MG tablet Take 1 tablet (20 mg total) by mouth daily. 15 tablet 0  . metFORMIN (GLUCOPHAGE) 500 MG tablet Take 500 mg by mouth 2 (two) times daily.  11  . predniSONE (DELTASONE) 10 MG tablet as directed.    . torsemide (DEMADEX) 20 MG tablet Take 20 mg by mouth daily.    Marland Kitchen acetaminophen (TYLENOL) 650 MG CR tablet Take 1,300 mg by mouth every 6 (six) hours as needed for pain.     No current facility-administered medications for this visit.     Allergies:   Cardizem [diltiazem hcl]; Clindamycin/lincomycin; and Warfarin and related   Social History:  The patient  reports that he has quit smoking. He has never used smokeless tobacco. He reports that he does not drink alcohol or use drugs.   Family History:  The patient's  family history includes Cancer in his mother.    ROS:  Please see the history of present illness.   All other systems are personally reviewed and negative.  PHYSICAL EXAM: VS:  BP 126/72   Pulse 79   Ht 6\' 1"  (1.854 m)   Wt 184 lb (83.5 kg)   BMI 24.28 kg/m  , BMI Body mass index is 24.28 kg/m. GEN: Well nourished, well developed, in no acute distress  HEENT: normal  Neck: no JVD, carotid bruits, or masses Cardiac: RRR; no murmurs, rubs, or gallops,no edema  Respiratory:  clear to auscultation bilaterally, normal work of breathing GI: soft, nontender, nondistended, + BS MS: no deformity or atrophy  Skin: warm and dry  Neuro:  Strength and sensation are intact Psych: euthymic mood, full  affect  EKG:  EKG is ordered today. The ekg ordered today is personally reviewed and shows sinus rhythm 79 bpm, PR 128 msec, QRS 84 msec, Qtc 433 msec   Recent Labs: 05/29/2017: BUN 28; Creatinine, Ser 0.94; Hemoglobin 14.5; Platelets 162; Potassium 4.0; Sodium 141  personally reviewed   Lipid Panel  No results found for: CHOL, TRIG, HDL, CHOLHDL, VLDL, LDLCALC, LDLDIRECT personally reviewed   Wt Readings from Last 3 Encounters:  05/05/18 184 lb (83.5 kg)  03/30/18 182 lb (82.6 kg)  09/14/17 183 lb (83 kg)      Other studies personally reviewed: Additional studies/ records that were reviewed today include: Jacob [name]'s notes, ekg,  echo 04/02/16 Review of the above records today demonstrates: as above   ASSESSMENT AND PLAN:  1.  SVT The patient has symptomatic SVT.  He is s/p prior slow pathway ablation in 2005.  He is also s/p MVRepair with LAA.  He has had narrow complex SVT documented on ETT 2016 per report but has not had other arrhythmias documented.  He states that electrode patches irritate his skin and he has not been interested in additional monitoring.  He has not had afib detected.  He is s/p PFO closure. Therapeutic strategies for supraventricular tachycardia including medicine and ablation were discussed in detail with the patient today. Risk, benefits, and alternatives to EP study and radiofrequency ablation were also discussed in detail today. These risks include but are not limited to stroke, bleeding, vascular damage, tamponade, perforation, damage to the heart and other structures, AV block requiring pacemaker, worsening renal function, and death. The patient understands these risk and wishes to think about this further.  He will contact my office if he decides to proceed with ablation. He does not wish to consider flecainide, multaq, or other AADs at this time.   Follow-up with Jacob [name] as scheduled I am happy to see as needed He will contact my office if he  decides to proceed with ablation.  Carto, ICE, and anesthesia would be required. If he decides for ablation > 60 days for now, I would like to see him in the office again first before we proceed.    Army Fossa, MD  05/05/2018 11:54 AM     [facility] [address] [place] [address] [phone] (office) [phone] (fax)

## 2018-05-05 NOTE — Patient Instructions (Addendum)
Medication Instructions:  Your physician recommends that you continue on your current medications as directed. Please refer to the Current Medication list given to you today.  Labwork: None ordered.  Testing/Procedures: Your physician has recommended that you have an ablation. Catheter ablation is a medical procedure used to treat some cardiac arrhythmias (irregular heartbeats). During catheter ablation, a long, thin, flexible tube is put into a blood vessel in your groin (upper thigh), or neck. This tube is called an ablation catheter. It is then guided to your heart through the blood vessel. Radio frequency waves destroy small areas of heart tissue where abnormal heartbeats may cause an arrhythmia to start. Please see the instruction sheet given to you today.  Follow-Up: Your physician wants you to follow-up in: as needed with Dr. Rayann Heman. Call the office and ask for Sonia Baller RN if you decide you would like to proceed with an ablation.  Any Other Special Instructions Will Be Listed Below (If Applicable).  If you need a refill on your cardiac medications before your next appointment, please call your pharmacy.    Cardiac Ablation Cardiac ablation is a procedure to disable (ablate) a small amount of heart tissue in very specific places. The heart has many electrical connections. Sometimes these connections are abnormal and can cause the heart to beat very fast or irregularly. Ablating some of the problem areas can improve the heart rhythm or return it to normal. Ablation may be done for people who:  Have Wolff-Parkinson-White syndrome.  Have fast heart rhythms (tachycardia).  Have taken medicines for an abnormal heart rhythm (arrhythmia) that were not effective or caused side effects.  Have a high-risk heartbeat that may be life-threatening.  During the procedure, a small incision is made in the neck or the groin, and a long, thin, flexible tube (catheter) is inserted into the incision and  moved to the heart. Small devices (electrodes) on the tip of the catheter will send out electrical currents. A type of X-ray (fluoroscopy) will be used to help guide the catheter and to provide images of the heart. Tell a health care provider about:  Any allergies you have.  All medicines you are taking, including vitamins, herbs, eye drops, creams, and over-the-counter medicines.  Any problems you or family members have had with anesthetic medicines.  Any blood disorders you have.  Any surgeries you have had.  Any medical conditions you have, such as kidney failure.  Whether you are pregnant or may be pregnant. What are the risks? Generally, this is a safe procedure. However, problems may occur, including:  Infection.  Bruising and bleeding at the catheter insertion site.  Bleeding into the chest, especially into the sac that surrounds the heart. This is a serious complication.  Stroke or blood clots.  Damage to other structures or organs.  Allergic reaction to medicines or dyes.  Need for a permanent pacemaker if the normal electrical system is damaged. A pacemaker is a small computer that sends electrical signals to the heart and helps your heart beat normally.  The procedure not being fully effective. This may not be recognized until months later. Repeat ablation procedures are sometimes required.  What happens before the procedure?  Follow instructions from your health care provider about eating or drinking restrictions.  Ask your health care provider about: ? Changing or stopping your regular medicines. This is especially important if you are taking diabetes medicines or blood thinners. ? Taking medicines such as aspirin and ibuprofen. These medicines can thin your blood.  Do not take these medicines before your procedure if your health care provider instructs you not to.  Plan to have someone take you home from the hospital or clinic.  If you will be going home  right after the procedure, plan to have someone with you for 24 hours. What happens during the procedure?  To lower your risk of infection: ? Your health care team will wash or sanitize their hands. ? Your skin will be washed with soap. ? Hair may be removed from the incision area.  An IV tube will be inserted into one of your veins.  You will be given a medicine to help you relax (sedative).  The skin on your neck or groin will be numbed.  An incision will be made in your neck or your groin.  A needle will be inserted through the incision and into a large vein in your neck or groin.  A catheter will be inserted into the needle and moved to your heart.  Dye may be injected through the catheter to help your surgeon see the area of the heart that needs treatment.  Electrical currents will be sent from the catheter to ablate heart tissue in desired areas. There are three types of energy that may be used to ablate heart tissue: ? Heat (radiofrequency energy). ? Laser energy. ? Extreme cold (cryoablation).  When the necessary tissue has been ablated, the catheter will be removed.  Pressure will be held on the catheter insertion area to prevent excessive bleeding.  A bandage (dressing) will be placed over the catheter insertion area. The procedure may vary among health care providers and hospitals. What happens after the procedure?  Your blood pressure, heart rate, breathing rate, and blood oxygen level will be monitored until the medicines you were given have worn off.  Your catheter insertion area will be monitored for bleeding. You will need to lie still for a few hours to ensure that you do not bleed from the catheter insertion area.  Do not drive for 24 hours or as long as directed by your health care provider. Summary  Cardiac ablation is a procedure to disable (ablate) a small amount of heart tissue in very specific places. Ablating some of the problem areas can improve the  heart rhythm or return it to normal.  During the procedure, electrical currents will be sent from the catheter to ablate heart tissue in desired areas. This information is not intended to replace advice given to you by your health care provider. Make sure you discuss any questions you have with your health care provider. Document Released: 04/26/2009 Document Revised: 10/27/2016 Document Reviewed: 10/27/2016 Elsevier Interactive Patient Education  [employer].
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
## 2018-08-03 ENCOUNTER — Encounter (INDEPENDENT_AMBULATORY_CARE_PROVIDER_SITE_OTHER): Payer: Self-pay | Admitting: Ophthalmology

## 2018-08-03 ENCOUNTER — Ambulatory Visit (INDEPENDENT_AMBULATORY_CARE_PROVIDER_SITE_OTHER): Payer: PPO | Admitting: Ophthalmology

## 2018-08-03 DIAGNOSIS — E119 Type 2 diabetes mellitus without complications: Secondary | ICD-10-CM

## 2018-08-03 DIAGNOSIS — H3581 Retinal edema: Secondary | ICD-10-CM

## 2018-08-03 DIAGNOSIS — H2513 Age-related nuclear cataract, bilateral: Secondary | ICD-10-CM | POA: Diagnosis not present

## 2018-08-03 DIAGNOSIS — H3561 Retinal hemorrhage, right eye: Secondary | ICD-10-CM | POA: Diagnosis not present

## 2018-08-03 DIAGNOSIS — H35033 Hypertensive retinopathy, bilateral: Secondary | ICD-10-CM | POA: Diagnosis not present

## 2018-08-03 DIAGNOSIS — Q141 Congenital malformation of retina: Secondary | ICD-10-CM | POA: Diagnosis not present

## 2018-08-03 DIAGNOSIS — H3589 Other specified retinal disorders: Secondary | ICD-10-CM

## 2018-08-03 DIAGNOSIS — H43823 Vitreomacular adhesion, bilateral: Secondary | ICD-10-CM

## 2018-10-18 DIAGNOSIS — H01021 Squamous blepharitis right upper eyelid: Secondary | ICD-10-CM | POA: Diagnosis not present

## 2018-10-18 DIAGNOSIS — H40013 Open angle with borderline findings, low risk, bilateral: Secondary | ICD-10-CM | POA: Diagnosis not present

## 2018-10-18 DIAGNOSIS — H01024 Squamous blepharitis left upper eyelid: Secondary | ICD-10-CM | POA: Diagnosis not present

## 2018-10-18 DIAGNOSIS — H01025 Squamous blepharitis left lower eyelid: Secondary | ICD-10-CM | POA: Diagnosis not present

## 2018-10-18 DIAGNOSIS — H04123 Dry eye syndrome of bilateral lacrimal glands: Secondary | ICD-10-CM | POA: Diagnosis not present

## 2018-10-18 DIAGNOSIS — E119 Type 2 diabetes mellitus without complications: Secondary | ICD-10-CM | POA: Diagnosis not present

## 2018-10-18 DIAGNOSIS — H02831 Dermatochalasis of right upper eyelid: Secondary | ICD-10-CM | POA: Diagnosis not present

## 2018-10-18 DIAGNOSIS — H01022 Squamous blepharitis right lower eyelid: Secondary | ICD-10-CM | POA: Diagnosis not present

## 2018-10-18 DIAGNOSIS — H02834 Dermatochalasis of left upper eyelid: Secondary | ICD-10-CM | POA: Diagnosis not present

## 2018-10-18 DIAGNOSIS — H2513 Age-related nuclear cataract, bilateral: Secondary | ICD-10-CM | POA: Diagnosis not present

## 2019-01-26 DIAGNOSIS — R2689 Other abnormalities of gait and mobility: Secondary | ICD-10-CM | POA: Diagnosis not present

## 2019-01-26 DIAGNOSIS — E785 Hyperlipidemia, unspecified: Secondary | ICD-10-CM | POA: Diagnosis not present

## 2019-01-26 DIAGNOSIS — I4891 Unspecified atrial fibrillation: Secondary | ICD-10-CM | POA: Diagnosis not present

## 2019-01-26 DIAGNOSIS — I10 Essential (primary) hypertension: Secondary | ICD-10-CM | POA: Diagnosis not present

## 2019-01-27 ENCOUNTER — Other Ambulatory Visit (HOSPITAL_COMMUNITY)
Admission: RE | Admit: 2019-01-27 | Discharge: 2019-01-27 | Disposition: A | Discharge status: Home / Self Care | Payer: PPO | Source: Ambulatory Visit | Attending: Pulmonary Disease | Admitting: Pulmonary Disease

## 2019-01-27 DIAGNOSIS — I10 Essential (primary) hypertension: Secondary | ICD-10-CM | POA: Diagnosis not present

## 2019-01-27 DIAGNOSIS — E785 Hyperlipidemia, unspecified: Secondary | ICD-10-CM | POA: Insufficient documentation

## 2019-01-27 DIAGNOSIS — R2689 Other abnormalities of gait and mobility: Secondary | ICD-10-CM | POA: Insufficient documentation

## 2019-01-27 DIAGNOSIS — I4891 Unspecified atrial fibrillation: Secondary | ICD-10-CM | POA: Insufficient documentation

## 2019-01-27 LAB — CBC
HEMATOCRIT: 44.5 % (ref 39.0–52.0)
HEMOGLOBIN: 15.1 g/dL (ref 13.0–17.0)
MCH: 28.5 pg (ref 26.0–34.0)
MCHC: 33.9 g/dL (ref 30.0–36.0)
MCV: 84 fL (ref 80.0–100.0)
Platelets: 196 10*3/uL (ref 150–400)
RBC: 5.3 MIL/uL (ref 4.22–5.81)
RDW: 12.7 % (ref 11.5–15.5)
WBC: 6.6 10*3/uL (ref 4.0–10.5)
nRBC: 0 % (ref 0.0–0.2)

## 2019-01-27 LAB — COMPREHENSIVE METABOLIC PANEL
ALBUMIN: 4.4 g/dL (ref 3.5–5.0)
ALK PHOS: 50 U/L (ref 38–126)
ALT: 14 U/L (ref 0–44)
ANION GAP: 7 (ref 5–15)
AST: 15 U/L (ref 15–41)
BILIRUBIN TOTAL: 0.8 mg/dL (ref 0.3–1.2)
BUN: 21 mg/dL (ref 8–23)
CALCIUM: 9.3 mg/dL (ref 8.9–10.3)
CO2: 26 mmol/L (ref 22–32)
Chloride: 107 mmol/L (ref 98–111)
Creatinine, Ser: 1 mg/dL (ref 0.61–1.24)
GFR calc non Af Amer: >60 mL/min (ref >60)
GLUCOSE: 148 mg/dL — AB (ref 70–99)
POTASSIUM: 4.3 mmol/L (ref 3.5–5.1)
SODIUM: 140 mmol/L (ref 135–145)
TOTAL PROTEIN: 7.5 g/dL (ref 6.5–8.1)

## 2019-01-27 LAB — LIPID PANEL
CHOL/HDL RATIO: 4.7 ratio
Cholesterol: 174 mg/dL (ref 0–200)
HDL: 37 mg/dL — AB (ref >40)
LDL Cholesterol: 122 mg/dL — ABNORMAL HIGH (ref 0–99)
TRIGLYCERIDES: 77 mg/dL (ref <150)
VLDL: 15 mg/dL (ref 0–40)

## 2019-01-27 LAB — VITAMIN B12: Vitamin B-12: 267 pg/mL (ref 180–914)

## 2019-01-27 LAB — HEMOGLOBIN A1C
HEMOGLOBIN A1C: 7.6 % — AB (ref 4.8–5.6)
Mean Plasma Glucose: 171.42 mg/dL

## 2019-01-27 LAB — PSA: PROSTATIC SPECIFIC ANTIGEN: 2.17 ng/mL (ref 0.00–4.00)

## 2019-01-27 LAB — TSH: TSH: 1.074 u[IU]/mL (ref 0.350–4.500)

## 2019-01-31 ENCOUNTER — Other Ambulatory Visit (HOSPITAL_COMMUNITY): Payer: Self-pay | Admitting: Pulmonary Disease

## 2019-01-31 ENCOUNTER — Other Ambulatory Visit: Payer: Self-pay | Admitting: Pulmonary Disease

## 2019-01-31 DIAGNOSIS — R2689 Other abnormalities of gait and mobility: Secondary | ICD-10-CM

## 2019-02-02 ENCOUNTER — Ambulatory Visit (HOSPITAL_COMMUNITY)
Admission: RE | Admit: 2019-02-02 | Discharge: 2019-02-02 | Disposition: A | Discharge status: Home / Self Care | Payer: PPO | Source: Ambulatory Visit | Attending: Pulmonary Disease | Admitting: Pulmonary Disease

## 2019-02-02 DIAGNOSIS — R2689 Other abnormalities of gait and mobility: Secondary | ICD-10-CM | POA: Insufficient documentation

## 2019-02-02 DIAGNOSIS — R413 Other amnesia: Secondary | ICD-10-CM | POA: Diagnosis not present

## 2019-02-16 DIAGNOSIS — Z7901 Long term (current) use of anticoagulants: Secondary | ICD-10-CM | POA: Diagnosis not present

## 2019-03-07 ENCOUNTER — Other Ambulatory Visit: Payer: Self-pay

## 2019-03-07 ENCOUNTER — Ambulatory Visit (HOSPITAL_COMMUNITY)
Admission: RE | Admit: 2019-03-07 | Discharge: 2019-03-07 | Disposition: A | Discharge status: Home / Self Care | Payer: PPO | Source: Ambulatory Visit | Attending: Pulmonary Disease | Admitting: Pulmonary Disease

## 2019-03-07 ENCOUNTER — Other Ambulatory Visit (HOSPITAL_COMMUNITY): Payer: Self-pay | Admitting: Pulmonary Disease

## 2019-03-07 DIAGNOSIS — M4807 Spinal stenosis, lumbosacral region: Secondary | ICD-10-CM | POA: Diagnosis not present

## 2019-03-07 DIAGNOSIS — M5440 Lumbago with sciatica, unspecified side: Secondary | ICD-10-CM

## 2019-03-07 DIAGNOSIS — M47816 Spondylosis without myelopathy or radiculopathy, lumbar region: Secondary | ICD-10-CM | POA: Diagnosis not present

## 2019-04-06 ENCOUNTER — Telehealth: Payer: Self-pay | Admitting: Cardiovascular Disease

## 2019-04-06 NOTE — Telephone Encounter (Signed)
LVM to call and schedule 1 yr followup.

## 2019-04-11 ENCOUNTER — Telehealth: Payer: Self-pay | Admitting: Cardiovascular Disease

## 2019-04-11 NOTE — Telephone Encounter (Signed)
Smartphone/ my chart/ virtual consent/ pre reg completed °

## 2019-04-11 NOTE — Telephone Encounter (Signed)
LVM for pre reg °

## 2019-04-12 ENCOUNTER — Telehealth (INDEPENDENT_AMBULATORY_CARE_PROVIDER_SITE_OTHER): Payer: PPO | Admitting: Cardiovascular Disease

## 2019-04-12 ENCOUNTER — Telehealth: Payer: Self-pay | Admitting: *Deleted

## 2019-04-12 ENCOUNTER — Telehealth: Payer: Self-pay

## 2019-04-12 VITALS — Ht 73.0 in | Wt 177.0 lb

## 2019-04-12 DIAGNOSIS — I484 Atypical atrial flutter: Secondary | ICD-10-CM

## 2019-04-12 DIAGNOSIS — Z7901 Long term (current) use of anticoagulants: Secondary | ICD-10-CM | POA: Diagnosis not present

## 2019-04-12 DIAGNOSIS — Z9889 Other specified postprocedural states: Secondary | ICD-10-CM | POA: Diagnosis not present

## 2019-04-12 DIAGNOSIS — I471 Supraventricular tachycardia: Secondary | ICD-10-CM | POA: Diagnosis not present

## 2019-04-12 DIAGNOSIS — E119 Type 2 diabetes mellitus without complications: Secondary | ICD-10-CM

## 2019-04-12 DIAGNOSIS — I4719 Other supraventricular tachycardia: Secondary | ICD-10-CM

## 2019-04-12 DIAGNOSIS — I10 Essential (primary) hypertension: Secondary | ICD-10-CM | POA: Diagnosis not present

## 2019-04-12 DIAGNOSIS — I341 Nonrheumatic mitral (valve) prolapse: Secondary | ICD-10-CM | POA: Diagnosis not present

## 2019-04-12 NOTE — Telephone Encounter (Signed)
Cardiac Questionnaire:    Since your last visit or hospitalization:    1. Have you been having new or worsening chest pain? NO   2. Have you been having new or worsening shortness of breath? NO 3. Have you been having new or worsening leg swelling, wt gain, or increase in abdominal girth (pants fitting more tightly)? NO   4. Have you had any passing out spells? NO    *A YES to any of these questions would result in the appointment being kept. *If all the answers to these questions are NO, we should indicate that given the current situation regarding the worldwide coronarvirus pandemic, at the recommendation of the CDC, we are looking to limit gatherings in our waiting area, and thus will reschedule their appointment beyond four weeks from today.   _____________   [id number]-81 Pre-Screening Questions:  . Do you currently have a fever? NO . Have you recently travelled on a cruise, internationally, or to [place], [place], [place], [place], [place], or [place], [place] [employer]) ?  . NO . you been in contact with someone that is currently pending confirmation of Covid19 testing or has been confirmed to have the [place] virus? NO . Are you currently experiencing fatigue or cough? NO       Virtual Visit Pre-Appointment Phone Call  Steps For Call:  1. Confirm consent - "In the setting of the current Covid19 crisis, you are scheduled for a (phone or video) visit with your provider on (date) at (time).  Just as we do with many in-office visits, in order for you to participate in this visit, we must obtain consent.  If you'd like, I can send this to your mychart (if signed up) or email for you to review.  Otherwise, I can obtain your verbal consent now.  All virtual visits are billed to your insurance company just like a normal visit would be.  By agreeing to a virtual visit, we'd like you to understand that the technology does not allow for your provider to perform an examination, and thus may limit your provider's  ability to fully assess your condition. If your provider identifies any concerns that need to be evaluated in person, we will make arrangements to do so.  Finally, though the technology is pretty good, we cannot assure that it will always work on either your or our end, and in the setting of a video visit, we may have to convert it to a phone-only visit.  In either situation, we cannot ensure that we have a secure connection.  Are you willing to proceed?" STAFF: Did the patient verbally acknowledge consent to telehealth visit? Document YES/NO here: YES  2. Confirm the BEST phone number to call the day of the visit by including in appointment notes  3. Give patient instructions for MyChart download to smartphone OR [email or web address] as below if video visit (depending on what platform provider is using)  4. Confirm that appointment type is correct in Epic appointment notes (VIDEO vs PHONE)  5. Advise patient to be prepared with their blood pressure, heart rate, weight, any heart rhythm information, their current medicines, and a piece of paper and pen handy for any instructions they may receive the day of their visit  6. Inform patient they will receive a phone call 15 minutes prior to their appointment time (may be from unknown caller ID) so they should be prepared to answer    [facility] [facility] has been deemed a candidate  for a follow-up tele-health visit to limit community exposure during the Covid-19 pandemic. I spoke with the patient via phone to ensure availability of phone/video source, confirm preferred email & phone number, and discuss instructions and expectations.  I reminded DALEY GOSSE to be prepared with any vital sign and/or heart rhythm information that could potentially be obtained via home monitoring, at the time of his visit. I reminded DEANTRE BOURDON to expect a phone call prior to his visit.  Venetia Maxon, CMA 04/12/2019 7:56 AM    INSTRUCTIONS FOR DOWNLOADING THE MYCHART APP TO SMARTPHONE  - The patient must first make sure to have activated MyChart and know their login information - If Apple, go to [employer] and type in MyChart in the search bar and download the app. If Android, ask patient to go to [employer] and type in [place] in the search bar and download the app. The app is free but as with any other app downloads, their phone may require them to verify saved payment information or Apple/Android password.  - The patient will need to then log into the app with their MyChart username and password, and select [facility] as their healthcare provider to link the account. When it is time for your visit, go to the MyChart app, find appointments, and click Begin Video Visit. Be sure to Select Allow for your device to access the Microphone and Camera for your visit. You will then be connected, and your provider will be with you shortly.  **If they have any issues connecting, or need assistance please contact MyChart service desk [phone])**  **If using a computer, in order to ensure the best quality for their visit they will need to use either of the following Internet Browsers: [employer], or Google Chrome**  IF USING DOXIMITY or DOXY.ME - The patient will receive a link just prior to their visit by text.     FULL LENGTH CONSENT FOR TELE-HEALTH VISIT   I hereby voluntarily request, consent and authorize [facility] and its employed or contracted physicians, physician assistants, nurse practitioners or other licensed health care professionals (the Practitioner), to provide me with telemedicine health care services (the "Services") as deemed necessary by the treating Practitioner. I acknowledge and consent to receive the Services by the Practitioner via telemedicine. I understand that the telemedicine visit will involve communicating with the Practitioner through live audiovisual communication  technology and the disclosure of certain medical information by electronic transmission. I acknowledge that I have been given the opportunity to request an in-person assessment or other available alternative prior to the telemedicine visit and am voluntarily participating in the telemedicine visit.  I understand that I have the right to withhold or withdraw my consent to the use of telemedicine in the course of my care at any time, without affecting my right to future care or treatment, and that the Practitioner or I may terminate the telemedicine visit at any time. I understand that I have the right to inspect all information obtained and/or recorded in the course of the telemedicine visit and may receive copies of available information for a reasonable fee.  I understand that some of the potential risks of receiving the Services via telemedicine include:  Marland Kitchen Delay or interruption in medical evaluation due to technological equipment failure or disruption; . Information transmitted may not be sufficient (e.g. poor resolution of images) to allow for appropriate medical decision making by the Practitioner; and/or  . In rare instances,  security protocols could fail, causing a breach of personal health information.  Furthermore, I acknowledge that it is my responsibility to provide information about my medical history, conditions and care that is complete and accurate to the best of my ability. I acknowledge that Practitioner's advice, recommendations, and/or decision may be based on factors not within their control, such as incomplete or inaccurate data provided by me or distortions of diagnostic images or specimens that may result from electronic transmissions. I understand that the practice of medicine is not an exact science and that Practitioner makes no warranties or guarantees regarding treatment outcomes. I acknowledge that I will receive a copy of this consent concurrently upon execution via email to the  email address I last provided but may also request a printed copy by calling the office of [facility].    I understand that my insurance will be billed for this visit.   I have read or had this consent read to me. . I understand the contents of this consent, which adequately explains the benefits and risks of the Services being provided via telemedicine.  . I have been provided ample opportunity to ask questions regarding this consent and the Services and have had my questions answered to my satisfaction. . I give my informed consent for the services to be provided through the use of telemedicine in my medical care  By participating in this telemedicine visit I agree to the above.

## 2019-04-12 NOTE — Telephone Encounter (Signed)

## 2019-04-12 NOTE — Patient Instructions (Signed)
Medication Instructions:  Continue same medications If you need a refill on your cardiac medications before your next appointment, please call your pharmacy.   Lab work: [employer] will be calling to schedule a drive thru Coumadin check   Testing/Procedures: None ordered  Follow-Up: At [facility], you and your health needs are our priority.  As part of our continuing mission to provide you with exceptional heart care, we have created designated Provider Care Teams.  These Care Teams include your primary Cardiologist (physician) and Advanced Practice Providers (APPs -  Physician Assistants and Nurse Practitioners) who all work together to provide you with the care you need, when you need it. . Schedule follow up appointment with Dr.Croitoru in 6 months     Call 3 months before to schedule

## 2019-04-12 NOTE — Progress Notes (Signed)
Virtual Visit via Telephone Note   This visit type was conducted due to national recommendations for restrictions regarding the COVID-19 Pandemic (e.g. social distancing) in an effort to limit this patient's exposure and mitigate transmission in our community.  Due to his co-morbid illnesses, this patient is at least at moderate risk for complications without adequate follow up.  This format is felt to be most appropriate for this patient at this time.  The patient did not have access to video technology/had technical difficulties with video requiring transitioning to audio format only (telephone).  All issues noted in this document were discussed and addressed.  No physical exam could be performed with this format.  Please refer to the patient's chart for his  consent to telehealth for [facility].   2 different video platforms were attempted (doximity and doxy.me), unsuccessfully.  Evaluation Performed:  Follow-up visit  Date:  04/12/2019   ID:  Jacob [name], DOB Apr 25, 1951, MRN [id number]  Patient Location: Home Provider Location: Home  PCP:  [name], MD  Cardiologist:  [name], MD  Electrophysiologist:  None   Chief Complaint: Palpitations  History of Present Illness:    Jacob [name] is a 68 y.o. male with a history of mitral valve repair for mitral valve prolapse with severe regurgitation 2005 and recurrent problems with palpitations that seem to be related to atypical atrial flutter.  For the last few months he has noticed worsening problems with balance and sometimes difficulty finding his words as well as some memory issues.  Dr. Luan Pulling performed a brain MRI that did not show evidence of acute stroke or brain atrophy or intracranial bleeding.  He started him on warfarin.  He has not had his prothrombin time rechecked in 2 weeks.  He believes that after he started treatment with warfarin his neurological complaints abated.  He has not had any bleeding  problems and denies frequent injuries or falls.  He has had some problems with low back pain and has had to use a cane intermittently, but is now recovering.  He has had palpitations, less than once a week on the average.  Sometimes he will go for several months without palpitations.  They are usually just unpleasant, only on one occasion did he have near syncope, resolved when he sat down.  He does not experience angina or dyspnea during the episodes of rapid palpitations.  Last year he was evaluated by Dr. Rayann Heman in the electrophysiology clinic and antiarrhythmics and ablation were discussed.  Mr. Guymon does not want to take antiarrhythmics and he felt the risks of ablation were not justified as his symptoms are not that severe.  The patient does not have symptoms concerning for COVID-19 infection (fever, chills, cough, or new shortness of breath).   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 At that time his left atrial appendage was oversewn and a patent foramen ovale was closed. Preoperative cardiac catheterization showed noncritical coronary atherosclerosis. Repeat cardiac catheterization in 2011 again showed minimal irregularities, the worst lesion being a 40% stenosis in the mid right coronary artery. He has had a couple of normal nuclear stress tests, most recently in 2009 and normal treadmill stress test most recently January 2016.   He also has a history of hypertension and dyslipidemia and weight gain related diabetes mellitus type 2. He  quit smoking in 1986.    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 He cannot wear an event monitor since he is allergic to the adhesive pads. He was  intolerant to metoprolol and carvedilol (fatigue). Bystolic caused excessive irritability.  Echo performed at [facility] in April 2017 showed trace mitral regurgitation normal left ventricular ejection fraction, normal left atrial size. Images are not available for review  Past Medical History:  Diagnosis Date  . A-fib ([place])   . CAD (coronary artery disease)   . Dyslipidemia   . Gout   . Hypertension   . 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 CARDIAC CATHETERIZATION  12/03/04   sign. one vessel disease,severe MR  . CARDIAC CATHETERIZATION  01/19/06   normal  . CARDIAC CATHETERIZATION  06/06/10   mild nonobstructive CAD  . cyst  1980   removed from wrist  . MITRAL VALVE SURGERY  12/11/04   MV repair with Oletta Lamas ring,closure of PFO  . RADIOFREQUENCY ABLATION  12/02/04   Dr. Emelda Brothers  . SALIVARY GLAND SURGERY  2004   growth  . TONSILLECTOMY  1959     Current Meds  Medication Sig  . acetaminophen (TYLENOL) 650 MG CR tablet Take 1,300 mg by mouth every 6 (six) hours as needed for pain.  Marland Kitchen lisinopril (PRINIVIL,ZESTRIL) 20 MG tablet Take 1 tablet (20 mg total) by mouth daily.  . metFORMIN (GLUCOPHAGE) 500 MG tablet Take 500 mg by mouth 3 (three) times daily.   Marland Kitchen torsemide (DEMADEX) 20 MG tablet Take 20-40 mg by mouth daily.   Marland Kitchen warfarin (COUMADIN) 5 MG tablet Take 5 mg by mouth daily.  . [DISCONTINUED] aspirin EC 81 MG tablet Take 81 mg by mouth daily.  . [DISCONTINUED] permethrin (ELIMITE) 5 % cream      Allergies:   Cardizem [diltiazem hcl]; Clindamycin/lincomycin; and Warfarin and related   Social History   Tobacco Use  . Smoking status: Former Research scientist (life sciences)  . Smokeless tobacco: Never Used  Substance Use Topics  . Alcohol use: No  . Drug use: No     Family Hx: The patient's family history includes Cancer in his mother.   ROS:   Please see the history of present illness.     All other systems reviewed and are negative.   Prior CV studies:   The following studies were reviewed today:  Notes from EP consultation  Labs/Other Tests and Data Reviewed:    EKG:  An ECG dated 0515/2019 was personally reviewed today and demonstrated:  Normal sinus rhythm, normal repolarization, QTC 433 ms  Recent Labs: 01/27/2019: ALT 14; BUN 21; Creatinine, Ser 1.00; Hemoglobin 15.1; Platelets 196; Potassium 4.3; Sodium 140; TSH 1.074   Recent Lipid Panel Lab Results  Component Value Date/Time   CHOL 174 01/27/2019 08:32 AM   TRIG 77 01/27/2019 08:32 AM   HDL 37 (L) 01/27/2019 08:32 AM   CHOLHDL 4.7 01/27/2019 08:32 AM   LDLCALC 122 (H) 01/27/2019 08:32 AM    Wt Readings from Last 3 Encounters:  04/12/19 177 lb (80.3 kg)  05/05/18 184 lb (83.5 kg)  03/30/18 182 lb (82.6 kg)     Objective:    Vital Signs:  Ht 6\' 1"  (1.854 m)   Wt 177  lb (80.3 kg)   BMI 23.35 kg/m    VITAL SIGNS:  reviewed Unable to examine  ASSESSMENT & PLAN:    1. SVT/atypical atrial flutter: He has not tolerated treatment with beta-blockers due to side effects.  He does not want to try to take true antiarrhythmics and does not feel that his symptoms are severe enough to justify ablation.  He has started anticoagulation.  He does have a history of surgical PFO closure left atrial appendage amputation 2. Warfarin: CHADSVasc 3 (Age, HTN, DM). He has noticed improvement in certain neurological complaints after he started anticoagulation.  He wants to continue it.  Discussed the fact that this medication needs frequent supervision and discussed the dietary and drug interactions.  He needs to have a prothrombin time checked and we will try to schedule him for our drive-through Coumadin clinic today.  Discussed the fact that direct oral anticoagulant such as Eliquis or Xarelto would be preferable, since they do not require prothrombin time monitoring.   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 3. S/P MV repair: No symptoms to suggest congestive heart failure or severe mitral insufficiency.  Last echocardiogram showed excellent mitral valve function. 4. HTN: His home blood pressure cuff seems to be unreliable, asked him to purchase a new one. 5. DM: He has lost a little more weight.  Diabetes control is reported as excellent.  COVID-19 Education: The signs and symptoms of COVID-19 were discussed with the patient and how to seek care for testing (follow up with PCP or arrange E-visit).  The importance of social distancing was discussed today.  Time:   Today, I have spent 23 minutes with the patient with telehealth technology discussing the above problems.     Medication Adjustments/Labs and Tests Ordered: Current medicines are reviewed at length with the patient today.  Concerns regarding medicines are outlined above.   Tests Ordered: No orders of the defined types were placed in this encounter.   Medication Changes: No orders of the defined types were placed in this encounter.   Disposition:  Follow up 6 months  Signed, [name], MD  04/12/2019 8:58 AM    [place]

## 2019-04-25 DIAGNOSIS — Z7901 Long term (current) use of anticoagulants: Secondary | ICD-10-CM | POA: Diagnosis not present

## 2019-04-28 DIAGNOSIS — Z7901 Long term (current) use of anticoagulants: Secondary | ICD-10-CM | POA: Diagnosis not present

## 2019-05-19 DIAGNOSIS — Z7901 Long term (current) use of anticoagulants: Secondary | ICD-10-CM | POA: Diagnosis not present

## 2019-05-26 DIAGNOSIS — Z0000 Encounter for general adult medical examination without abnormal findings: Secondary | ICD-10-CM | POA: Diagnosis not present

## 2019-05-26 LAB — HM DIABETES FOOT EXAM

## 2019-05-27 DIAGNOSIS — I059 Rheumatic mitral valve disease, unspecified: Secondary | ICD-10-CM | POA: Diagnosis not present

## 2019-05-27 DIAGNOSIS — I4891 Unspecified atrial fibrillation: Secondary | ICD-10-CM | POA: Diagnosis not present

## 2019-05-27 DIAGNOSIS — E785 Hyperlipidemia, unspecified: Secondary | ICD-10-CM | POA: Diagnosis not present

## 2019-05-27 DIAGNOSIS — I10 Essential (primary) hypertension: Secondary | ICD-10-CM | POA: Diagnosis not present

## 2019-05-27 DIAGNOSIS — Z0000 Encounter for general adult medical examination without abnormal findings: Secondary | ICD-10-CM | POA: Diagnosis not present

## 2019-05-27 DIAGNOSIS — M7989 Other specified soft tissue disorders: Secondary | ICD-10-CM | POA: Diagnosis not present

## 2019-05-27 DIAGNOSIS — R5383 Other fatigue: Secondary | ICD-10-CM | POA: Diagnosis not present

## 2019-05-27 DIAGNOSIS — E119 Type 2 diabetes mellitus without complications: Secondary | ICD-10-CM | POA: Diagnosis not present

## 2019-05-27 LAB — HEMOGLOBIN A1C: Hemoglobin A1C: 8.5

## 2019-05-27 LAB — BASIC METABOLIC PANEL
BUN: 16 (ref 4–21)
CO2: 28 — AB (ref 13–22)
Chloride: 102 (ref 99–108)
Creatinine: 1.1 (ref 0.6–1.3)
Glucose: 174
Potassium: 5 (ref 3.4–5.3)
Sodium: 139 (ref 137–147)

## 2019-05-27 LAB — LIPID PANEL
Cholesterol: 180 (ref 0–200)
HDL: 28 — AB (ref 35–70)
LDL Cholesterol: 122
LDl/HDL Ratio: 6.4
Triglycerides: 181 — AB (ref 40–160)

## 2019-05-27 LAB — PSA: PSA: 1.5

## 2019-05-27 LAB — HEPATIC FUNCTION PANEL
ALT: 9 — AB (ref 10–40)
AST: 11 — AB (ref 14–40)
Alkaline Phosphatase: 53 (ref 25–125)

## 2019-05-27 LAB — TSH: TSH: 1.11 (ref 0.41–5.90)

## 2019-05-27 LAB — CBC AND DIFFERENTIAL
HCT: 40 — AB (ref 41–53)
Hemoglobin: 13.8 (ref 13.5–17.5)
Platelets: 172 (ref 150–399)
WBC: 5.1

## 2019-05-27 LAB — COMPREHENSIVE METABOLIC PANEL
Albumin: 4.1 (ref 3.5–5.0)
Calcium: 9.3 (ref 8.7–10.7)
GFR calc Af Amer: 79
GFR calc non Af Amer: 68
Globulin: 2.6

## 2019-05-27 LAB — CBC: RBC: 4.76 (ref 3.87–5.11)

## 2019-05-27 LAB — MICROALBUMIN, URINE: Microalb, Ur: 221

## 2019-06-13 DIAGNOSIS — I4891 Unspecified atrial fibrillation: Secondary | ICD-10-CM | POA: Diagnosis not present

## 2019-07-12 DIAGNOSIS — Z7901 Long term (current) use of anticoagulants: Secondary | ICD-10-CM | POA: Diagnosis not present

## 2019-07-25 ENCOUNTER — Other Ambulatory Visit: Payer: Self-pay

## 2019-07-27 DIAGNOSIS — Z7901 Long term (current) use of anticoagulants: Secondary | ICD-10-CM | POA: Diagnosis not present

## 2019-08-01 DIAGNOSIS — Z7901 Long term (current) use of anticoagulants: Secondary | ICD-10-CM | POA: Diagnosis not present

## 2019-08-08 IMAGING — MR MR HEAD W/O CM
6 of 10 series · 29 of 48 positions shown · non-contrast
Comparison: Brain MRI 03/17/2005. [HOSPITAL] Lien Vinger CT
10/22/2014.

CLINICAL DATA: 67-year-old male with imbalance. Memory problems.
History of concussion but no recent injury.

EXAM:
MRI HEAD WITHOUT CONTRAST
TECHNIQUE: Multiplanar, multiecho pulse sequences of the brain and surrounding
structures were obtained without intravenous contrast.

[Series 2: DWI · axial · 3.0mm · 0.77mm/px · z∈[-87,+74]mm · 6 of 55 slices shown (1 of 4)]
[im 1/55]
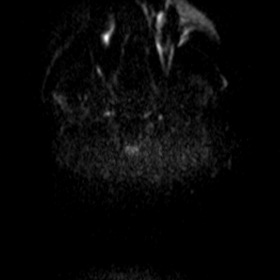
[im 11/55]
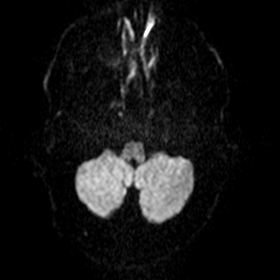
[im 22/55]
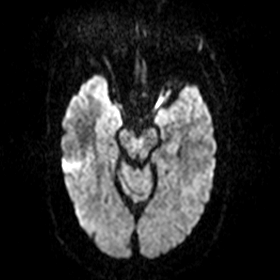
[im 33/55]
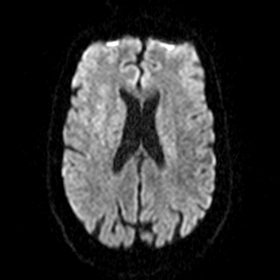
[im 44/55]
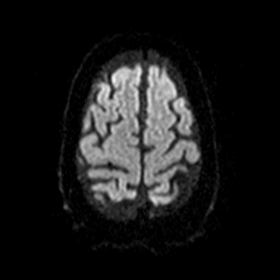
[im 55/55]
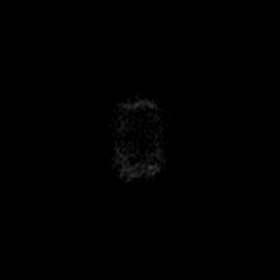

[Series 3: DWI · axial · 3.0mm · 0.77mm/px · z∈[-87,+74]mm · 6 of 53 slices shown (2 of 4)]
[im 1/53]
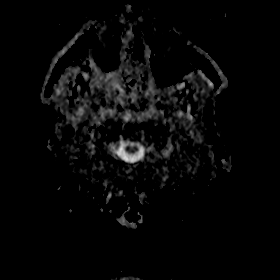
[im 11/53]
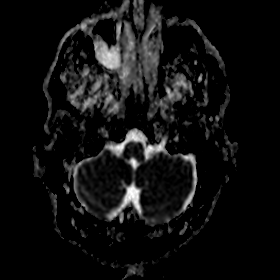
[im 21/53]
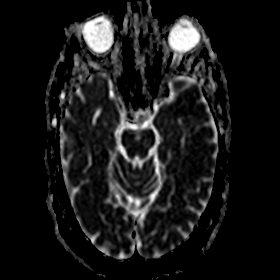
[im 32/53]
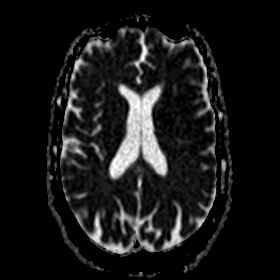
[im 42/53]
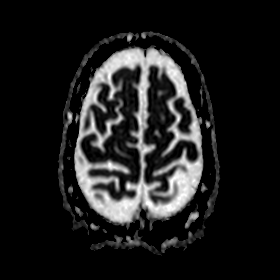
[im 53/53]
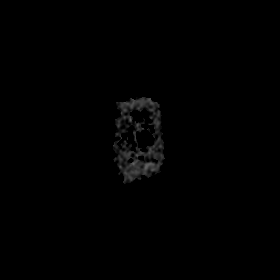

[Series 4: DWI · coronal · 5.0mm · 0.44mm/px · 5 of 38 slices shown (3 of 4)]
[im 1/38]
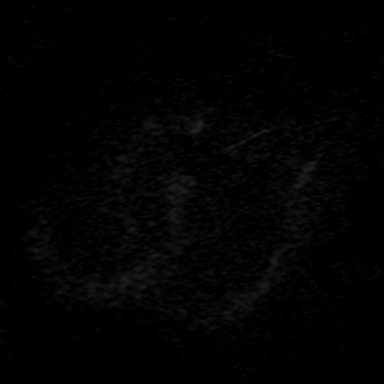
[im 10/38]
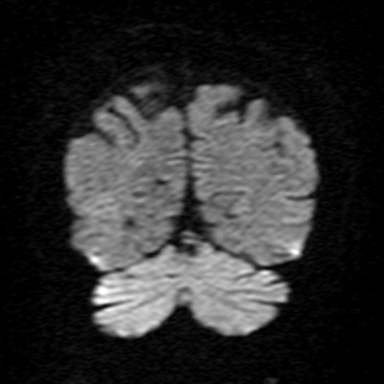
[im 19/38]
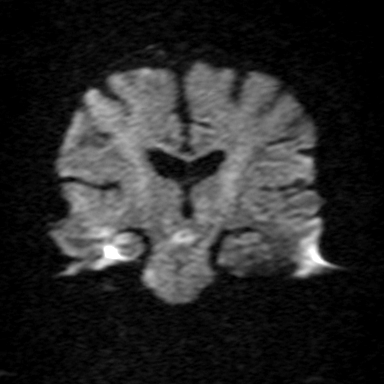
[im 28/38]
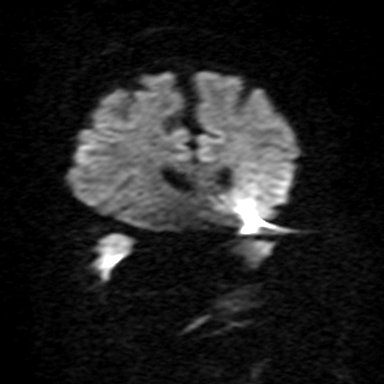
[im 38/38]
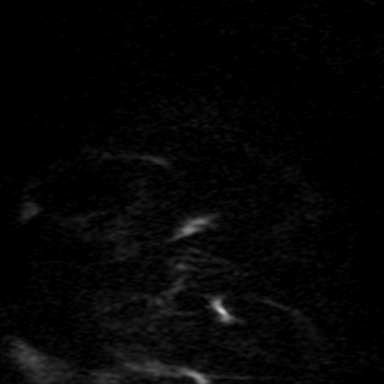

[Series 5: DWI · coronal · 5.0mm · 0.53mm/px · 4 of 37 slices shown (4 of 4)]
[im 1/37]
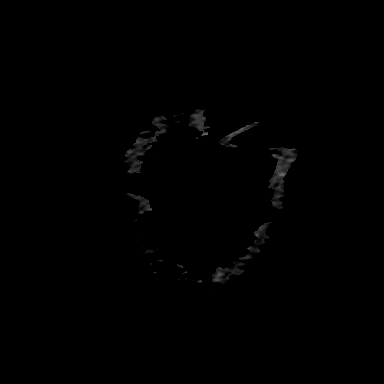
[im 13/37]
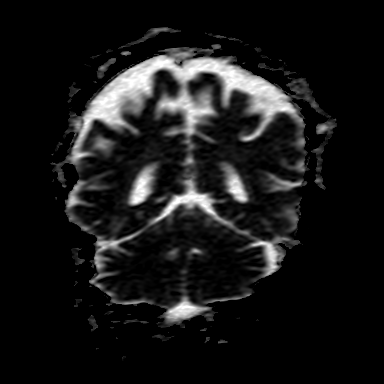
[im 25/37]
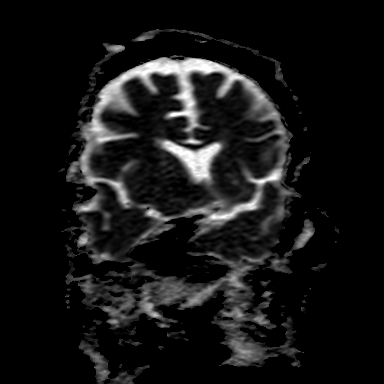
[im 37/37]
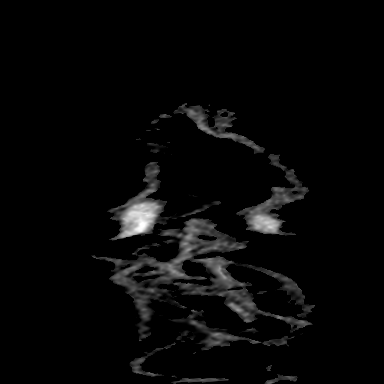

[Series 7: T2 · coronal · 5.0mm · 0.42mm/px · 2 of 28 slices shown]
[im 1/28]
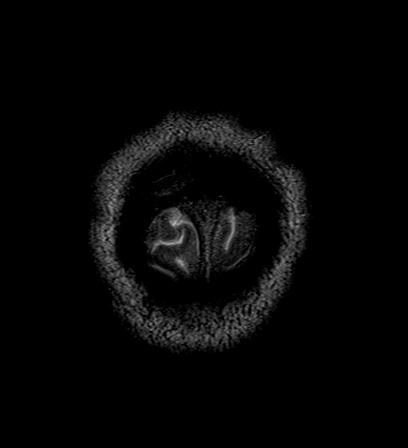
[im 14/28]
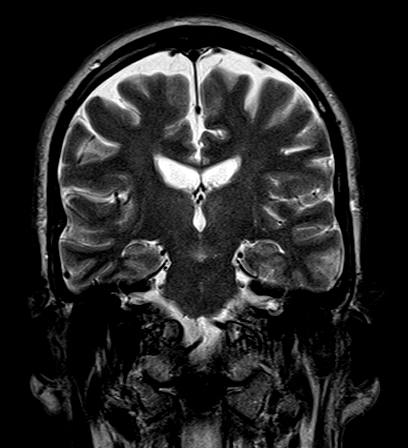

[Series 9: FLAIR · axial · 3.0mm · 0.36mm/px · z∈[-72,+65]mm · 6 of 47 slices shown]
[im 1/47]
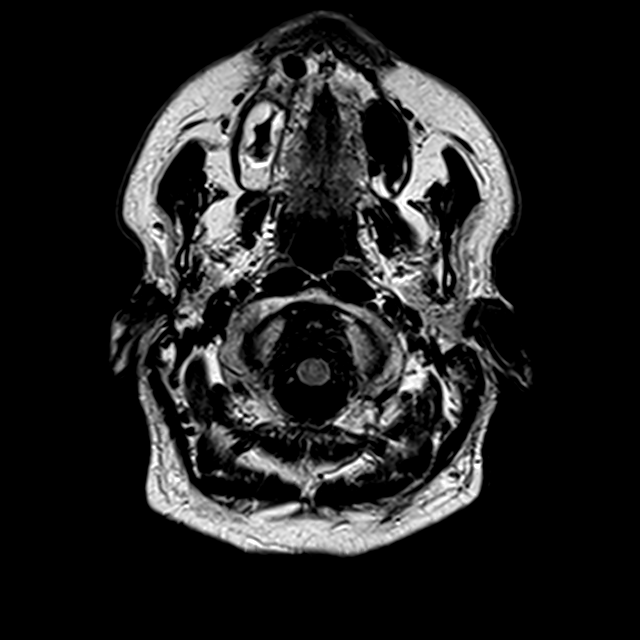
[im 10/47]
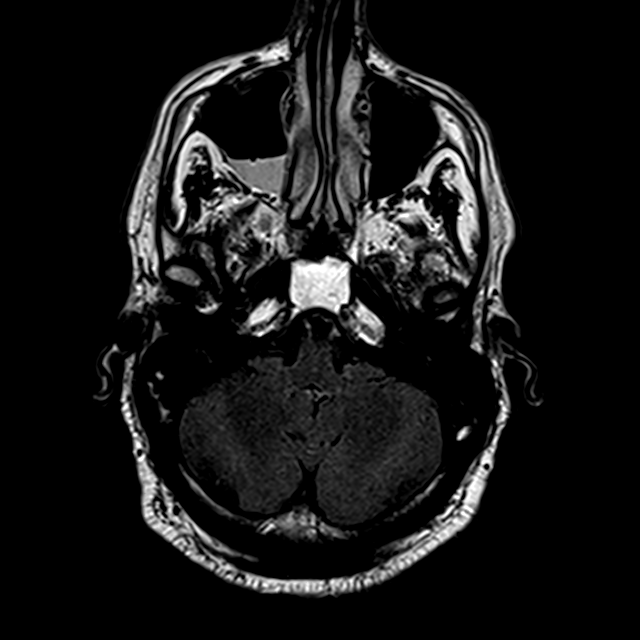
[im 19/47]
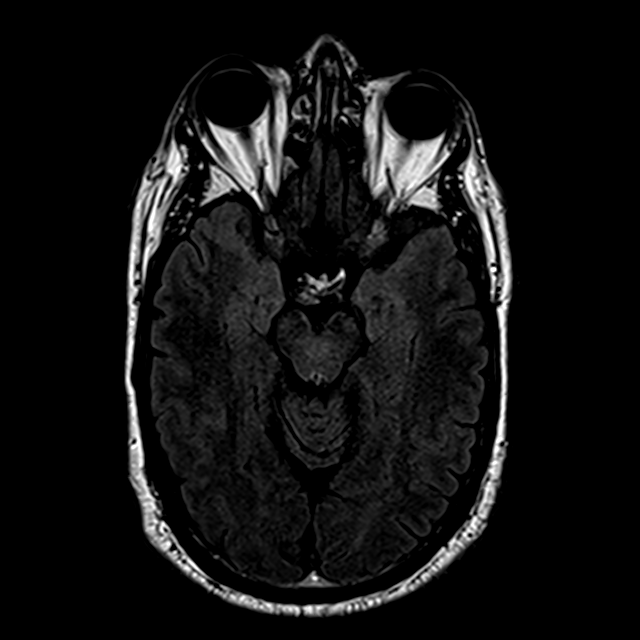
[im 28/47]
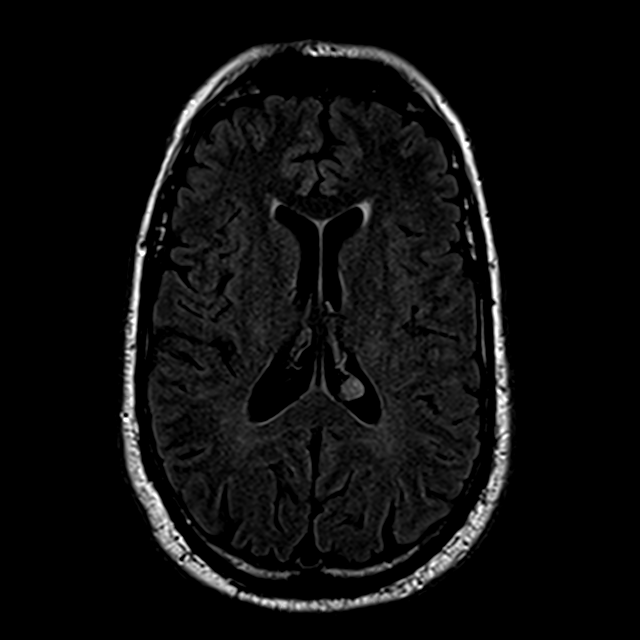
[im 37/47]
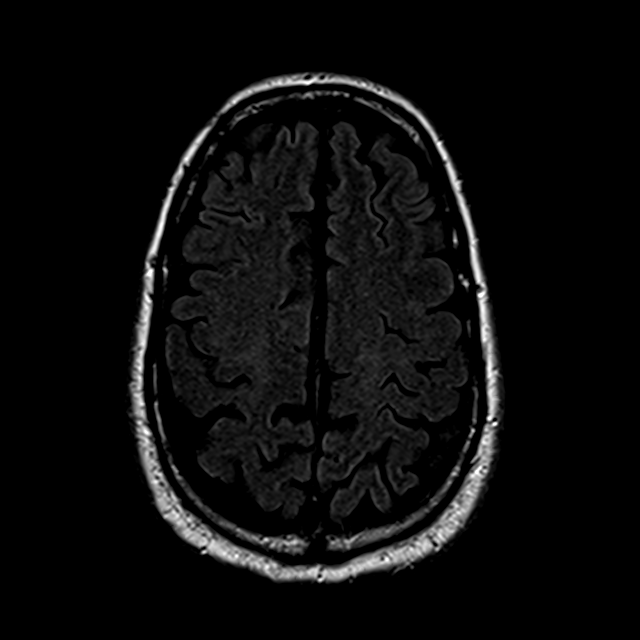
[im 47/47]
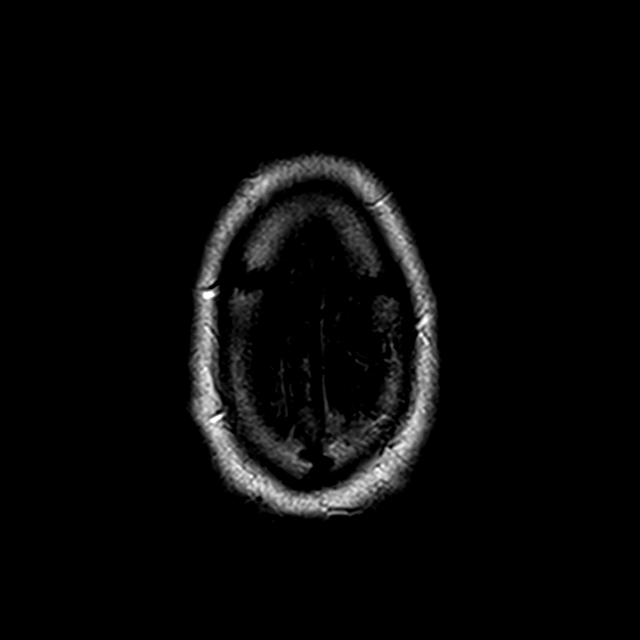

[29 of 48 positions shown; findings below may reference images not displayed]

FINDINGS: Brain: No restricted diffusion to suggest acute infarction. No
midline shift, mass effect, evidence of mass lesion,
ventriculomegaly, extra-axial collection or acute intracranial
hemorrhage. Cervicomedullary junction and pituitary are within
normal limits.

Cerebral volume is within normal limits for age. Minimal to mild for
age scattered small nonspecific cerebral white matter T2 and FLAIR
hyperintensity. No cortical encephalomalacia. There is a small
chronic microhemorrhage in the pons (series 11, image 7), and also a
similar focus in the right inferior cerebellum (image 3). Deep gray
matter nuclei are normal. Temporal lobe structures appear normal.

Vascular: Major intracranial vascular flow voids are stable since
0995.

Skull and upper cervical spine: Negative visible cervical spine.
Normal bone marrow signal.

Sinuses/Orbits: Negative orbits. There is a small to moderate fluid
level in the right maxillary sinus. Mild chronic right maxillary
sinus mucosal thickening. Trace anterior ethmoid mucosal thickening.

Other: A mild left mastoid air cell effusion has mildly increased
since 0995. The right mastoids remain well pneumatized. Negative
nasopharynx. Other Visible internal auditory structures appear
normal. Scalp and face soft tissues appear negative.
IMPRESSION: 1.  No acute intracranial abnormality.
2. Mild for age signal changes in the brain suggesting chronic small
vessel disease, including rare chronic microhemorrhages in the
brainstem and cerebellum.
3. Mild acute inflammatory changes in the right maxillary sinus. A
mild left mastoid air cell effusion is probably postinflammatory and
chronic to an extent.

## 2019-08-14 ENCOUNTER — Encounter

## 2019-08-23 DIAGNOSIS — Z7901 Long term (current) use of anticoagulants: Secondary | ICD-10-CM | POA: Diagnosis not present

## 2019-09-23 DIAGNOSIS — I4891 Unspecified atrial fibrillation: Secondary | ICD-10-CM | POA: Diagnosis not present

## 2019-09-23 LAB — POCT INR: INR: 4 — AB (ref 0.9–1.1)

## 2019-10-28 ENCOUNTER — Telehealth: Payer: Self-pay | Admitting: Cardiovascular Disease

## 2019-10-28 NOTE — Telephone Encounter (Signed)
Spoke with pt, for the last 2 months he has been out of rhythm more than in by his report. He is calling to find out what to do. He has been off the warfarin for 3 weeks now waiting to hear from a coumadin clinic in [place] but has not heard from anyone. He reports fatigue and weak when out of rhythm. He has just come off prednisone and his heart rate is all over the place. He has a follow up with dr croitoru in December. Will forward to dr croitoru to review and advise.

## 2019-10-28 NOTE — Telephone Encounter (Signed)
Patient c/o Palpitations:  High priority if patient c/o lightheadedness, shortness of breath, or chest pain  1) How long have you had palpitations/irregular HR/ Afib? Are you having the symptoms now? About 2 months and is currently having the symptoms now.  2) Are you currently experiencing lightheadedness, SOB or CP? Chest pain that feels more like heartburn   3) Do you have a history of afib (atrial fibrillation) or irregular heart rhythm? Yes Afib  Have you checked your BP or HR? (document readings if available): no, does not have BP machine  4) Are you experiencing any other symptoms?   No  Patient states that he is not taking Coumadin anymore. Has an appt with Dr. Sallyanne Kuster on 11/29/19

## 2019-10-28 NOTE — Telephone Encounter (Signed)
Spoke with pt, aware of dr croitoru's recommendations. He has warfarin at home and will restart at his previous dosage tonight. Follow up scheduled in the [place] coumadin clinic Thursday next week.

## 2019-10-28 NOTE — Telephone Encounter (Signed)
He needs to restart anticoagulation. It is very hard to safely deal w the AFib unless he is anticoagulated. Best option is to start a DOAC like Eliquis 5 mg twice daily, if he agrees (I believe cost was the issue that prevented him from taking DOAC; he had been furloughed during the lockdown this Spring). This will take away the need for coumadin clinic and INR checks, and will give superior protection from strokes and bleeding. If he still wants coumadin, we need to figure out why the clinic has not been in touch with him and reestablish follow up.Marland Kitchen

## 2019-11-03 ENCOUNTER — Ambulatory Visit (INDEPENDENT_AMBULATORY_CARE_PROVIDER_SITE_OTHER): Payer: PPO | Admitting: *Deleted

## 2019-11-03 ENCOUNTER — Other Ambulatory Visit: Payer: Self-pay

## 2019-11-03 DIAGNOSIS — Z5181 Encounter for therapeutic drug level monitoring: Secondary | ICD-10-CM | POA: Diagnosis not present

## 2019-11-03 DIAGNOSIS — I4891 Unspecified atrial fibrillation: Secondary | ICD-10-CM | POA: Diagnosis not present

## 2019-11-03 DIAGNOSIS — I4892 Unspecified atrial flutter: Secondary | ICD-10-CM

## 2019-11-03 LAB — POCT INR: INR: 1.7 — AB (ref 2.0–3.0)

## 2019-11-03 NOTE — Patient Instructions (Signed)
Restarted warfarin 10/28/2019 Increase warfarin to 1 1/2 tablets daily except 1 tablet on Tuesdays and Fridays Recheck in 1 week

## 2019-11-10 ENCOUNTER — Ambulatory Visit (INDEPENDENT_AMBULATORY_CARE_PROVIDER_SITE_OTHER): Payer: PPO | Admitting: *Deleted

## 2019-11-10 ENCOUNTER — Other Ambulatory Visit: Payer: Self-pay

## 2019-11-10 DIAGNOSIS — Z5181 Encounter for therapeutic drug level monitoring: Secondary | ICD-10-CM

## 2019-11-10 DIAGNOSIS — I4891 Unspecified atrial fibrillation: Secondary | ICD-10-CM | POA: Diagnosis not present

## 2019-11-10 DIAGNOSIS — I4892 Unspecified atrial flutter: Secondary | ICD-10-CM

## 2019-11-10 LAB — POCT INR: INR: 2.5 (ref 2.0–3.0)

## 2019-11-10 NOTE — Patient Instructions (Signed)
Restarted warfarin 10/28/2019 Continue warfarin 1 1/2 tablets daily except 1 tablet on Tuesdays and Fridays Recheck in 2 weeks

## 2019-11-24 ENCOUNTER — Ambulatory Visit (INDEPENDENT_AMBULATORY_CARE_PROVIDER_SITE_OTHER): Payer: PPO | Admitting: *Deleted

## 2019-11-24 DIAGNOSIS — Z5181 Encounter for therapeutic drug level monitoring: Secondary | ICD-10-CM | POA: Diagnosis not present

## 2019-11-24 DIAGNOSIS — I4891 Unspecified atrial fibrillation: Secondary | ICD-10-CM | POA: Diagnosis not present

## 2019-11-24 DIAGNOSIS — I4892 Unspecified atrial flutter: Secondary | ICD-10-CM

## 2019-11-24 LAB — POCT INR: INR: 2.5 (ref 2.0–3.0)

## 2019-11-24 NOTE — Patient Instructions (Signed)
Restarted warfarin 10/28/2019 Continue warfarin 1 1/2 tablets daily except 1 tablet on Tuesdays and Fridays Recheck in 3 weeks

## 2019-11-29 ENCOUNTER — Other Ambulatory Visit: Payer: Self-pay

## 2019-11-29 ENCOUNTER — Ambulatory Visit: Payer: PPO | Admitting: Cardiovascular Disease

## 2019-11-29 VITALS — BP 126/80 | HR 82 | Temp 97.0°F | Ht 73.0 in | Wt 177.0 lb

## 2019-11-29 DIAGNOSIS — I484 Atypical atrial flutter: Secondary | ICD-10-CM | POA: Diagnosis not present

## 2019-11-29 DIAGNOSIS — I10 Essential (primary) hypertension: Secondary | ICD-10-CM | POA: Diagnosis not present

## 2019-11-29 DIAGNOSIS — E119 Type 2 diabetes mellitus without complications: Secondary | ICD-10-CM

## 2019-11-29 DIAGNOSIS — Z9889 Other specified postprocedural states: Secondary | ICD-10-CM

## 2019-11-29 DIAGNOSIS — R079 Chest pain, unspecified: Secondary | ICD-10-CM | POA: Diagnosis not present

## 2019-11-29 DIAGNOSIS — E785 Hyperlipidemia, unspecified: Secondary | ICD-10-CM

## 2019-11-29 NOTE — Progress Notes (Signed)
Cardiology Office Note    Date:  12/04/2019   ID:  Jacob [name], DOB 1951-01-15, MRN [id number]  PCP:  [name], MD  Cardiologist:   [name], MD   Chief Complaint  Patient presents with  . Irregular Heart Beat    History of Present Illness:  Jacob [name] is a 68 y.o. male who presents for follow up for atrial arrhythmia and history of MV repair.  Palpitations have not recently been a bother.  He has had one episode of retrosternal "burning it only lasted for 30 or 45 seconds. This happened about a month ago without subsequent recurrence.  He denies exertional dyspnea.  Glycemic control has deteriorated with a hemoglobin A1c of 8.5% in June.  He continues to have a very low HDL at 28 and his LDL is mildly elevated at 122.  He is intolerant of statins which have produced personality changes every time he has tried to take them.  Financial issues continue to be a burden.  In January 2016, he developed very rapid atrial tachycardia (scar related left atrial flutter?) at about 220 bpm during exercise testing. This is likely the cause of his intermittent palpitations. 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 He has been poorly tolerant to multiple beta blockers (metoprolol and carvedilol cause fatigue, nebivolol made him to "always be in a bad mood").  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 At that time his left atrial appendage was oversewn and a patent foramen ovale was closed. Preoperative cardiac catheterization showed noncritical coronary atherosclerosis. Repeat cardiac catheterization in 2011 again showed minimal irregularities, the worst lesion being a 40% stenosis in the mid right coronary artery. He has had a couple of normal nuclear  stress tests, most recently in 2009 and normal treadmill stress test most recently January 2016.   He also has a history of hypertension and dyslipidemia and weight gain related diabetes mellitus type 2. He quit smoking in 1986.    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 He cannot wear an event monitor since he is allergic to the adhesive pads. He was intolerant to metoprolol and carvedilol (fatigue). Bystolic caused excessive irritability.  Echo performed at [facility] in April 2017 showed trace mitral regurgitation normal left ventricular ejection fraction, normal left atrial size. Images are not available for review    Past Medical History:  Diagnosis Date  . A-fib ([place])   . CAD (coronary artery disease)   . Dyslipidemia   . Gout   . Hypertension   . 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 CARDIAC CATHETERIZATION  12/03/04   sign. one vessel disease,severe MR  . CARDIAC CATHETERIZATION  01/19/06   normal  . CARDIAC CATHETERIZATION  06/06/10   mild nonobstructive CAD  . cyst  1980   removed from wrist  . MITRAL VALVE SURGERY  12/11/04   MV repair with Oletta Lamas ring,closure of PFO  . RADIOFREQUENCY ABLATION  12/02/04   Dr. Emelda Brothers  . SALIVARY GLAND SURGERY  2004  growth  . TONSILLECTOMY  1959    Outpatient Medications Prior to Visit  Medication Sig Dispense Refill  . acetaminophen (TYLENOL) 650 MG CR tablet Take 1,300 mg by mouth every 6 (six) hours as needed for pain.    Marland Kitchen lisinopril (PRINIVIL,ZESTRIL) 20 MG tablet Take 1 tablet (20 mg total) by mouth daily. 15 tablet 0  . metFORMIN (GLUCOPHAGE) 500 MG tablet Take 500 mg by mouth 3 (three) times daily.   11  . torsemide (DEMADEX) 20 MG tablet Take  20-40 mg by mouth daily.     Marland Kitchen warfarin (COUMADIN) 5 MG tablet Take 5 mg by mouth daily.     No facility-administered medications prior to visit.     Allergies:   Cardizem [diltiazem hcl], Clindamycin/lincomycin, and Warfarin and related   Social History   Socioeconomic History  . Marital status: Married    Spouse name: Not on file  . Number of children: Not on file  . Years of education: Not on file  . Highest education level: Not on file  Occupational History  . Not on file  Tobacco Use  . Smoking status: Former Research scientist (life sciences)  . Smokeless tobacco: Never Used  Substance and Sexual Activity  . Alcohol use: No  . Drug use: No  . Sexual activity: Not on file  Other Topics Concern  . Not on file  Social History Narrative  . Not on file   Social Determinants of Health   Financial Resource Strain:   . Difficulty of Paying Living Expenses: Not on file  Food Insecurity:   . Worried About Charity fundraiser in the Last Year: Not on file  . Ran Out of Food in the Last Year: Not on file  Transportation Needs:   . Lack of Transportation (Medical): Not on file  . Lack of Transportation (Non-Medical): Not on file  Physical Activity:   . Days of Exercise per Week: Not on file  . Minutes of Exercise per Session: Not on file  Stress:   . Feeling of Stress : Not on file  Social Connections:   . Frequency of Communication with Friends and Family: Not on file  . Frequency of Social Gatherings with Friends and Family: Not on file  . Attends Religious Services: Not on file  . Active Member of Clubs or Organizations: Not on file  . Attends Archivist Meetings: Not on file  . Marital Status: Not on file     Family History:  The patient's family history includes Cancer in his mother.   ROS:   Please see the history of present illness.    ROS All other systems reviewed and are negative.   PHYSICAL EXAM:   VS:  BP 126/80   Pulse 82   Temp (!) 97 F (36.1 C)   Ht 6\' 1"   (1.854 m)   Wt 177 lb (80.3 kg)   SpO2 100%   BMI 23.35 kg/m      General: Alert, oriented x3, no distress, lean and fit Head: no evidence of trauma, PERRL, EOMI, no exophtalmos or lid lag, no myxedema, no xanthelasma; normal ears, nose and oropharynx Neck: normal jugular venous pulsations and no hepatojugular reflux; brisk carotid pulses without delay and no carotid bruits Chest: clear to auscultation, no signs of consolidation by percussion or palpation, normal fremitus, symmetrical and full respiratory excursions Cardiovascular: normal position and quality of the apical impulse, regular rhythm, normal first and second heart sounds, no murmurs, rubs or gallops Abdomen:  no tenderness or distention, no masses by palpation, no abnormal pulsatility or arterial bruits, normal bowel sounds, no hepatosplenomegaly Extremities: no clubbing, cyanosis or edema; 2+ radial, ulnar and brachial pulses bilaterally; 2+ right femoral, posterior tibial and dorsalis pedis pulses; 2+ left femoral, posterior tibial and dorsalis pedis pulses; no subclavian or femoral bruits Neurological: grossly nonfocal Psych: Normal mood and affect    Wt Readings from Last 3 Encounters:  11/29/19 177 lb (80.3 kg)  04/12/19 177 lb (80.3 kg)  05/05/18 184 lb (83.5 kg)      Studies/Labs Reviewed:   EKG: EKG ordered today shows normal sinus rhythm, normal tracing, QTC 428 ms   BMET    Component Value Date/Time   NA 140 01/27/2019 0831   K 4.3 01/27/2019 0831   CL 107 01/27/2019 0831   CO2 26 01/27/2019 0831   GLUCOSE 148 (H) 01/27/2019 0831   BUN 21 01/27/2019 0831   CREATININE 1.00 01/27/2019 0831   CALCIUM 9.3 01/27/2019 0831   GFRNONAA >60 01/27/2019 0831   GFRAA >60 01/27/2019 0831     Lipid Panel     Component Value Date/Time   CHOL 174 01/27/2019 0832   TRIG 77 01/27/2019 0832   HDL 37 (L) 01/27/2019 0832   CHOLHDL 4.7 01/27/2019 0832   VLDL 15 01/27/2019 0832   LDLCALC 122 (H) 01/27/2019 0832     ASSESSMENT:    1. Atypical atrial flutter ([place])   2. History of cardiac radiofrequency ablation (RFA)   3. History of mitral valve repair   4. Essential hypertension   5. Diabetes mellitus type 2 in nonobese (HCC)   6. Chest pain of uncertain etiology   7. Dyslipidemia (high LDL; low HDL)      PLAN:  In order of problems listed above:   1. Atypical atrial flutter: Not currently symptomatic.  See treadmill stress test ECG tracings from 2017. Most likely has scar reentry flutter in the left atrium.  We have never documented atrial fibrillation. In 2019, he was evaluated by Dr. Rayann Heman in the electrophysiology clinic and antiarrhythmics and ablation were discussed.  Jacob [name] does not want to take antiarrhythmics and he felt the risks of ablation were not justified as his symptoms are not that severe. CHADSVasc 3 (Age, HTN, DM).  On warfarin. He did have his left atrial appendage oversewn he should be at lower embolic risk. 2. 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 S/P mitral annuloplasty repair: Durable result.  Normal findings on echo 2014 and in April 2017, trivial MR. left atrial appendage was oversewn and PFO close at the time of surgery. 4. HTN: Good control. 5. DM: Recently his glycemic control has deteriorated, probably due to reduced physical activity. 6. Chest pain: I am not sure what caused this episode of chest discomfort, but this was brief and has not recurred in 1 month.  Discussed the difference between stable and unstable angina.  He does have multiple coronary risk factors but does not have known CAD.  Not on aspirin due to full warfarin anticoagulation.  I believe the focus should remain on risk factor management.  Last assessment was a normal nuclear stress test in 2009 and he did not have coronary disease on pre and the repair angiography. 7. HLP: He has typical metabolic syndrome pattern of dyslipidemia, despite losing weight.  He has tried taking  statins unsuccessfully, several times.  Unable to afford expensive brand-name medication such as Repatha.   Medication Adjustments/Labs and  Tests Ordered: Current medicines are reviewed at length with the patient today.  Concerns regarding medicines are outlined above.  Medication changes, Labs and Tests ordered today are listed in the Patient Instructions below. Patient Instructions  Medication Instructions:  No changes *If you need a refill on your cardiac medications before your next appointment, please call your pharmacy*  Lab Work: None ordered If you have labs (blood work) drawn today and your tests are completely normal, you will receive your results only by: Marland Kitchen MyChart Message (if you have MyChart) OR . A paper copy in the mail If you have any lab test that is abnormal or we need to change your treatment, we will call you to review the results.  Testing/Procedures: None ordered  Follow-Up: At [facility], you and your health needs are our priority.  As part of our continuing mission to provide you with exceptional heart care, we have created designated Provider Care Teams.  These Care Teams include your primary Cardiologist (physician) and Advanced Practice Providers (APPs -  Physician Assistants and Nurse Practitioners) who all work together to provide you with the care you need, when you need it.  Your next appointment:   12 month(s)  The format for your next appointment:   In Person  Provider:   [name], MD       Signed, [name], MD  12/04/2019 10:10 AM    [facility] [address], [place], [place]  [address] Phone: [phone]; Fax: [phone]

## 2019-11-29 NOTE — Patient Instructions (Signed)

## 2019-12-04 ENCOUNTER — Encounter: Payer: Self-pay | Admitting: Cardiovascular Disease

## 2019-12-08 DIAGNOSIS — H04123 Dry eye syndrome of bilateral lacrimal glands: Secondary | ICD-10-CM | POA: Diagnosis not present

## 2019-12-08 DIAGNOSIS — H2513 Age-related nuclear cataract, bilateral: Secondary | ICD-10-CM | POA: Diagnosis not present

## 2019-12-08 DIAGNOSIS — H02834 Dermatochalasis of left upper eyelid: Secondary | ICD-10-CM | POA: Diagnosis not present

## 2019-12-08 DIAGNOSIS — H0102A Squamous blepharitis right eye, upper and lower eyelids: Secondary | ICD-10-CM | POA: Diagnosis not present

## 2019-12-08 DIAGNOSIS — H40013 Open angle with borderline findings, low risk, bilateral: Secondary | ICD-10-CM | POA: Diagnosis not present

## 2019-12-08 DIAGNOSIS — H0102B Squamous blepharitis left eye, upper and lower eyelids: Secondary | ICD-10-CM | POA: Diagnosis not present

## 2019-12-08 DIAGNOSIS — H43821 Vitreomacular adhesion, right eye: Secondary | ICD-10-CM | POA: Diagnosis not present

## 2019-12-08 DIAGNOSIS — H02831 Dermatochalasis of right upper eyelid: Secondary | ICD-10-CM | POA: Diagnosis not present

## 2019-12-08 DIAGNOSIS — E119 Type 2 diabetes mellitus without complications: Secondary | ICD-10-CM | POA: Diagnosis not present

## 2019-12-15 ENCOUNTER — Other Ambulatory Visit: Payer: Self-pay

## 2019-12-15 ENCOUNTER — Ambulatory Visit (INDEPENDENT_AMBULATORY_CARE_PROVIDER_SITE_OTHER): Payer: PPO | Admitting: *Deleted

## 2019-12-15 DIAGNOSIS — Z5181 Encounter for therapeutic drug level monitoring: Secondary | ICD-10-CM | POA: Diagnosis not present

## 2019-12-15 DIAGNOSIS — I4892 Unspecified atrial flutter: Secondary | ICD-10-CM

## 2019-12-15 DIAGNOSIS — I4891 Unspecified atrial fibrillation: Secondary | ICD-10-CM

## 2019-12-15 LAB — POCT INR
INR: 2.7 (ref 2.0–3.0)
INR: 2.7 (ref 2.0–3.0)

## 2019-12-15 NOTE — Patient Instructions (Signed)
Continue warfarin 1 1/2 tablets daily except 1 tablet on Tuesdays and Fridays Recheck in 4 weeks 

## 2019-12-15 NOTE — Patient Instructions (Signed)
Continue warfarin 1 1/2 tablets daily except 1 tablet on Tuesdays and Fridays Recheck in 4 weeks

## 2019-12-19 DIAGNOSIS — R5383 Other fatigue: Secondary | ICD-10-CM | POA: Insufficient documentation

## 2019-12-19 DIAGNOSIS — E785 Hyperlipidemia, unspecified: Secondary | ICD-10-CM

## 2019-12-19 DIAGNOSIS — I059 Rheumatic mitral valve disease, unspecified: Secondary | ICD-10-CM | POA: Insufficient documentation

## 2019-12-19 DIAGNOSIS — I4891 Unspecified atrial fibrillation: Secondary | ICD-10-CM

## 2019-12-19 DIAGNOSIS — M7989 Other specified soft tissue disorders: Secondary | ICD-10-CM

## 2019-12-19 DIAGNOSIS — E119 Type 2 diabetes mellitus without complications: Secondary | ICD-10-CM

## 2019-12-19 DIAGNOSIS — I10 Essential (primary) hypertension: Secondary | ICD-10-CM | POA: Insufficient documentation

## 2019-12-29 ENCOUNTER — Telehealth: Payer: Self-pay | Admitting: *Deleted

## 2019-12-29 NOTE — Telephone Encounter (Signed)
Spoke with patient.  States he blew his nose this morning and had a nose bleed.  Lasted about 1 minute.  No problem getting it stopped.  Reviewed last INR records.  Labs have been stable.  Told pt to monitor.  If nose bleed returns pt to call back for INR check.  Told pt he could use saline nasal spray for dryness.  He verbalized understanding and was appreciative of call back.

## 2019-12-29 NOTE — Telephone Encounter (Signed)
Patient called stating that he started having a nosebleed approximately 1 hour ago.

## 2020-01-10 ENCOUNTER — Other Ambulatory Visit: Payer: Self-pay

## 2020-01-10 MED ORDER — LISINOPRIL 20 MG PO TABS: 20.0000 mg | ORAL_TABLET | Freq: Every day | ORAL | 3 refills | Status: DC

## 2020-01-12 ENCOUNTER — Ambulatory Visit (INDEPENDENT_AMBULATORY_CARE_PROVIDER_SITE_OTHER): Payer: PPO | Admitting: *Deleted

## 2020-01-12 ENCOUNTER — Other Ambulatory Visit: Payer: Self-pay

## 2020-01-12 DIAGNOSIS — Z5181 Encounter for therapeutic drug level monitoring: Secondary | ICD-10-CM | POA: Diagnosis not present

## 2020-01-12 DIAGNOSIS — I4891 Unspecified atrial fibrillation: Secondary | ICD-10-CM

## 2020-01-12 DIAGNOSIS — I4892 Unspecified atrial flutter: Secondary | ICD-10-CM

## 2020-01-12 LAB — POCT INR: INR: 1.5 — AB (ref 2.0–3.0)

## 2020-01-12 NOTE — Patient Instructions (Signed)
Take warfarin extra 1/2 tablet today, take 2 tablets tomorrow then resume 1 1/2 tablets daily except 1 tablet on Tuesdays and Fridays Recheck in 2 weeks

## 2020-01-26 ENCOUNTER — Ambulatory Visit (INDEPENDENT_AMBULATORY_CARE_PROVIDER_SITE_OTHER): Payer: PPO | Admitting: *Deleted

## 2020-01-26 ENCOUNTER — Other Ambulatory Visit: Payer: Self-pay

## 2020-01-26 DIAGNOSIS — Z5181 Encounter for therapeutic drug level monitoring: Secondary | ICD-10-CM

## 2020-01-26 DIAGNOSIS — I4892 Unspecified atrial flutter: Secondary | ICD-10-CM | POA: Diagnosis not present

## 2020-01-26 DIAGNOSIS — I4891 Unspecified atrial fibrillation: Secondary | ICD-10-CM | POA: Diagnosis not present

## 2020-01-26 LAB — POCT INR: INR: 1.3 — AB (ref 2.0–3.0)

## 2020-01-26 MED ORDER — WARFARIN SODIUM 5 MG PO TABS: ORAL_TABLET | ORAL | 6 refills | Status: DC

## 2020-01-26 NOTE — Patient Instructions (Signed)
Take warfarin 2 tablets tonight and tomorrow night then resume 1 1/2 tablets daily except 1 tablet on Tuesdays and Fridays Recheck in 1 week

## 2020-02-02 ENCOUNTER — Other Ambulatory Visit: Payer: Self-pay

## 2020-02-02 ENCOUNTER — Ambulatory Visit (INDEPENDENT_AMBULATORY_CARE_PROVIDER_SITE_OTHER): Payer: PPO | Admitting: *Deleted

## 2020-02-02 DIAGNOSIS — Z5181 Encounter for therapeutic drug level monitoring: Secondary | ICD-10-CM

## 2020-02-02 DIAGNOSIS — I4892 Unspecified atrial flutter: Secondary | ICD-10-CM | POA: Diagnosis not present

## 2020-02-02 DIAGNOSIS — I4891 Unspecified atrial fibrillation: Secondary | ICD-10-CM | POA: Diagnosis not present

## 2020-02-02 LAB — POCT INR: INR: 2.2 (ref 2.0–3.0)

## 2020-02-02 NOTE — Patient Instructions (Signed)
Continue warfarin 1 1/2 tablets daily except 1 tablet on Tuesdays and Fridays Recheck in 3 weeks

## 2020-02-16 ENCOUNTER — Encounter: Payer: Self-pay | Admitting: Family Medicine

## 2020-02-23 ENCOUNTER — Other Ambulatory Visit: Payer: Self-pay

## 2020-02-23 ENCOUNTER — Ambulatory Visit (INDEPENDENT_AMBULATORY_CARE_PROVIDER_SITE_OTHER): Payer: PPO | Admitting: *Deleted

## 2020-02-23 DIAGNOSIS — I4892 Unspecified atrial flutter: Secondary | ICD-10-CM

## 2020-02-23 DIAGNOSIS — Z5181 Encounter for therapeutic drug level monitoring: Secondary | ICD-10-CM

## 2020-02-23 DIAGNOSIS — I4891 Unspecified atrial fibrillation: Secondary | ICD-10-CM | POA: Diagnosis not present

## 2020-02-23 LAB — POCT INR: INR: 3 (ref 2.0–3.0)

## 2020-02-23 NOTE — Patient Instructions (Signed)
Continue warfarin 1 1/2 tablets daily except 1 tablet on Tuesdays and Fridays Recheck in 4 weeks

## 2020-03-08 ENCOUNTER — Other Ambulatory Visit: Payer: Self-pay | Admitting: Cardiovascular Disease

## 2020-03-08 NOTE — Telephone Encounter (Signed)
*  STAT* If patient is at the pharmacy, call can be transferred to refill team.   1. Which medications need to be refilled? (please list name of each medication and dose if known) Metformin 500 mg  2. Which pharmacy/location (including street and city if local pharmacy) is medication to be sent to? [phone]  3. Do they need a 30 day or 90 day supply? 90 patient has up coming apt on 03/13/20

## 2020-03-12 NOTE — Telephone Encounter (Signed)
Please send to Dr [employer] nurse.  I only manage his warfarin. Thanks

## 2020-03-13 ENCOUNTER — Telehealth (INDEPENDENT_AMBULATORY_CARE_PROVIDER_SITE_OTHER): Payer: PPO | Admitting: Cardiology

## 2020-03-13 ENCOUNTER — Encounter: Payer: Self-pay | Admitting: Cardiology

## 2020-03-13 VITALS — Ht 73.0 in | Wt 174.0 lb

## 2020-03-13 DIAGNOSIS — Z7901 Long term (current) use of anticoagulants: Secondary | ICD-10-CM | POA: Diagnosis not present

## 2020-03-13 DIAGNOSIS — Z9889 Other specified postprocedural states: Secondary | ICD-10-CM

## 2020-03-13 DIAGNOSIS — Z7189 Other specified counseling: Secondary | ICD-10-CM

## 2020-03-13 DIAGNOSIS — I4892 Unspecified atrial flutter: Secondary | ICD-10-CM | POA: Diagnosis not present

## 2020-03-13 DIAGNOSIS — I471 Supraventricular tachycardia: Secondary | ICD-10-CM

## 2020-03-13 DIAGNOSIS — E119 Type 2 diabetes mellitus without complications: Secondary | ICD-10-CM

## 2020-03-13 DIAGNOSIS — I10 Essential (primary) hypertension: Secondary | ICD-10-CM

## 2020-03-13 DIAGNOSIS — E785 Hyperlipidemia, unspecified: Secondary | ICD-10-CM

## 2020-03-13 DIAGNOSIS — Z7984 Long term (current) use of oral hypoglycemic drugs: Secondary | ICD-10-CM | POA: Diagnosis not present

## 2020-03-13 MED ORDER — HYDROXYZINE PAMOATE 25 MG PO CAPS: 25.0000 mg | ORAL_CAPSULE | Freq: Four times a day (QID) | ORAL | 1 refills | Status: DC | PRN

## 2020-03-13 MED ORDER — ALLOPURINOL 300 MG PO TABS: 300.0000 mg | ORAL_TABLET | Freq: Every day | ORAL | 1 refills | Status: DC

## 2020-03-13 MED ORDER — METFORMIN HCL 500 MG PO TABS: 500.0000 mg | ORAL_TABLET | Freq: Three times a day (TID) | ORAL | 1 refills | Status: DC

## 2020-03-13 MED ORDER — TORSEMIDE 20 MG PO TABS: 20.0000 mg | ORAL_TABLET | Freq: Every day | ORAL | 1 refills | Status: DC

## 2020-03-13 NOTE — Progress Notes (Signed)
Virtual Visit via Video Note   This visit type was conducted due to national recommendations for restrictions regarding the COVID-19 Pandemic (e.g. social distancing) in an effort to limit this patient's exposure and mitigate transmission in our community.  Due to his co-morbid illnesses, this patient is at least at moderate risk for complications without adequate follow up.  This format is felt to be most appropriate for this patient at this time.  All issues noted in this document were discussed and addressed.  A limited physical exam was performed with this format.  Please refer to the patient's chart for his consent to telehealth for [facility].   The patient was identified using 2 identifiers.  Date:  03/13/2020   ID:  Jacob [name], DOB Aug 23, 1951, MRN [id number]  Patient Location: Home Provider Location: Home  PCP:  Patient, No Pcp Per  Cardiologist:  Jacob Klein, MD  Electrophysiologist:  None   Evaluation Performed:  Follow-Up Visit  Chief Complaint:  Needs medications refilled  History of Present Illness:    Jacob [name] is a 69 y.o. male with a somewhat complicated cardia history.  In 2005 he had a mitral valve repair.  Around that time he also had radiofrequency ablation for an atrial tachycardia.  At the time of his surgery he did have his left atrial appendage sewn over.  He has had an recurrent episode of atrial tachycardia since.  He saw Jacob [name] in 2019.  The patient declined antiarrhythmic therapy.  He has been holding sinus rhythm as of late.  Dr. Shirlee [name] saw him in December 2020.  Other medical issues include non-insulin-dependent diabetes, essential hypertension, dyslipidemia, and mild coronary disease.  He had normal coronaries in 2005 2007 and in 2011 he had a 40% RCA.  There is records that he had a low risk nuclear stress test in 2016 and [place].  He is on Coumadin for [facility] DS-VASC of 3.  The patient contacted our office today to see if some  of his medications could be refilled.  Apparently his primary care provider Dr. Luan [name] is retiring.  The patient has an appointment to be established with a PCP but it is not until May.  He called Dr. Luan [name] office about the refill and they set up this appointment.  Since Dr. Sallyanne [name] saw the patient in December he is done well.  He is walking 5 days a week.  He does admit some days he does not feel as good as others but overall he feels fine.  He is not having any palpitations or tachycardia.  The patient does not have symptoms concerning for COVID-19 infection (fever, chills, cough, or new shortness of breath).    Past Medical History:  Diagnosis Date  . A-fib ([place])   . CAD (coronary artery disease)   . Dyslipidemia   . Essential (primary) hypertension   . Gout   . Hyperlipidemia, unspecified   . Hypertension   . 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 Other fatigue   . Other specified soft tissue disorders   . Rheumatic mitral valve disease, unspecified   . 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 Type 2 diabetes mellitus without complications ([place])   . Unspecified atrial fibrillation [facility])    Past Surgical History:  Procedure Laterality Date  . CARDIAC CATHETERIZATION  12/03/04   sign. one vessel disease,severe MR  . CARDIAC CATHETERIZATION  01/19/06   normal  .  CARDIAC CATHETERIZATION  06/06/10   mild nonobstructive CAD  . cyst  1980   removed from wrist  . MITRAL VALVE SURGERY  12/11/04   MV repair with Oletta Lamas ring,closure of PFO  . RADIOFREQUENCY ABLATION  12/02/04   Dr. Emelda Brothers  . SALIVARY GLAND SURGERY  2004   growth  . TONSILLECTOMY  1959     Current Meds  Medication Sig  . acetaminophen (TYLENOL) 650 MG CR tablet Take 1,300 mg by mouth every 6 (six) hours as needed for pain.  Marland Kitchen allopurinol (ZYLOPRIM) 300 MG tablet Take 300 mg by mouth daily.  . hydrOXYzine (VISTARIL) 25 MG capsule Take 25 mg by mouth 4 (four)  times daily as needed.  Marland Kitchen lisinopril (ZESTRIL) 20 MG tablet Take 1 tablet (20 mg total) by mouth daily.  . metFORMIN (GLUCOPHAGE) 500 MG tablet Take 500 mg by mouth 3 (three) times daily.   Marland Kitchen torsemide (DEMADEX) 20 MG tablet Take 20-40 mg by mouth daily.   Marland Kitchen warfarin (COUMADIN) 5 MG tablet Take 1 1/2 tablets daily except 1 tablet on Tuesdays and Fridays     Allergies:   Cardizem [diltiazem hcl], Clindamycin/lincomycin, and Warfarin and related   Social History   Tobacco Use  . Smoking status: Former Research scientist (life sciences)  . Smokeless tobacco: Never Used  Substance Use Topics  . Alcohol use: No  . Drug use: No     Family Hx: The patient's family history includes Cancer in his mother.  ROS:   Please see the history of present illness.    All other systems reviewed and are negative.   Prior CV studies:   The following studies were reviewed today:   Labs/Other Tests and Data Reviewed:    EKG:  An ECG dated 12/11/2019 was personally reviewed today and demonstrated:  NSR-75  Recent Labs: 05/27/2019: ALT 9; BUN 16; Creatinine 1.1; Hemoglobin 13.8; Platelets 172; Potassium 5.0; Sodium 139; TSH 1.11   Recent Lipid Panel Lab Results  Component Value Date/Time   CHOL 180 05/27/2019 12:00 AM   TRIG 181 (A) 05/27/2019 12:00 AM   HDL 28 (A) 05/27/2019 12:00 AM   CHOLHDL 4.7 01/27/2019 08:32 AM   LDLCALC 122 05/27/2019 12:00 AM    Wt Readings from Last 3 Encounters:  03/13/20 174 lb (78.9 kg)  11/29/19 177 lb (80.3 kg)  04/12/19 177 lb (80.3 kg)     Objective:    Vital Signs:  Ht 6\' 1"  (1.854 m)   Wt 174 lb (78.9 kg)   BMI 22.96 kg/m    VITAL SIGNS:  reviewed  ASSESSMENT & PLAN:    H/O MV repair- S/p MV annuloplasty 28 mm ring (2005) for severe MR; [name]. Left atrial appendage was oversown. PFO was closed  Atrial flutter- H/O RFA 2005 for AVNRT, EP consult (Dr Jacob [name]) in 2019- pt is holding NSR.  Chronic anticoagulation- CHADS-VASC=3- on Coumadin  NIDDM- Needs Glucophage  refilled  Plan: I told Jacob [name] we would be happy to refill his medications until he can get established with a primary care provider.  I did tell him we would need to update his lab work since we do not have any lab since June 2020.    COVID-19 Education: The signs and symptoms of COVID-19 were discussed with the patient and how to seek care for testing (follow up with PCP or arrange E-visit).  The importance of social distancing was discussed today.  Time:   Today, I have spent 20 minutes with the patient with  telehealth technology discussing the above problems.     Medication Adjustments/Labs and Tests Ordered: Current medicines are reviewed at length with the patient today.  Concerns regarding medicines are outlined above.   Tests Ordered: No orders of the defined types were placed in this encounter.   Medication Changes: No orders of the defined types were placed in this encounter.   Follow Up:  In Person Dr Jacob [name] as scheduled  Signed, Kerin Ransom, Hershal Coria  03/13/2020 8:47 AM    [place]

## 2020-03-13 NOTE — Addendum Note (Signed)
Addended by: Therisa Doyne on: 03/13/2020 12:36 PM   Modules accepted: Orders

## 2020-03-13 NOTE — Patient Instructions (Signed)
Medication Instructions:  Your physician recommends that you continue on your current medications as directed. Please refer to the Current Medication list given to you today.  *If you need a refill on your cardiac medications before your next appointment, please call your pharmacy*   Lab Work: Your physician recommends that you return for a FASTING lipid profile, CBC, CMET, and Hemoglobin A1C. OK to have done in [place].  If you have labs (blood work) drawn today and your tests are completely normal, you will receive your results only by: Marland Kitchen MyChart Message (if you have MyChart) OR . A paper copy in the mail If you have any lab test that is abnormal or we need to change your treatment, we will call you to review the results.  Follow-Up: At [facility], you and your health needs are our priority.  As part of our continuing mission to provide you with exceptional heart care, we have created designated Provider Care Teams.  These Care Teams include your primary Cardiologist (physician) and Advanced Practice Providers (APPs -  Physician Assistants and Nurse Practitioners) who all work together to provide you with the care you need, when you need it.  We recommend signing up for the patient portal called "MyChart".  Sign up information is provided on this After Visit Summary.  MyChart is used to connect with patients for Virtual Visits (Telemedicine).  Patients are able to view lab/test results, encounter notes, upcoming appointments, etc.  Non-urgent messages can be sent to your provider as well.   To learn more about what you can do with MyChart, go to [email or web address].    Your next appointment:   December 2021  The format for your next appointment:   In Person  Provider:   [name], MD   Other Instructions Please call our office 2 months in advance to schedule your follow-up appointment.

## 2020-03-14 ENCOUNTER — Telehealth: Payer: Self-pay | Admitting: Cardiology

## 2020-03-14 NOTE — Telephone Encounter (Signed)
   Pt has question regarding his Lab work, he is confused where to go to get it done. He would like to go near [place]. He would like to speak with a nurse.  Please call

## 2020-03-14 NOTE — Telephone Encounter (Signed)
The patient has been made aware that he can go to any [facility] that is convenient for him. He has verbalized his understanding.

## 2020-03-15 ENCOUNTER — Telehealth: Payer: Self-pay | Admitting: Cardiovascular Disease

## 2020-03-15 NOTE — Telephone Encounter (Signed)
Pt c/o medication issue:  1. Name of Medication: hydrOXYzine (VISTARIL) 25 MG capsule  2. How are you currently taking this medication (dosage and times per day)? Not currently taking medication  3. Are you having a reaction (difficulty breathing--STAT)? No   4. What is your medication issue? Matty is calling due to being prescribed this medication and wanting to know why. He states he has never taken it before that he is aware of and doesn't recall discussing this with Dr. Sallyanne Kuster or the nurse. Please advise.

## 2020-03-15 NOTE — Telephone Encounter (Signed)
Returned call to patient-patient was wondering what hydroxyzine is and why this was called into his pharmacy.  He is not familiar with this medication.  Advised at [place] with [facility] all meds were refilled until he finds a new PCP, therefore it must have been on his med list from in the past.  He states he is not taking.  Advised I will discontinue off med list.   Patient verbalized understanding.

## 2020-03-19 DIAGNOSIS — E119 Type 2 diabetes mellitus without complications: Secondary | ICD-10-CM | POA: Diagnosis not present

## 2020-03-19 DIAGNOSIS — I471 Supraventricular tachycardia: Secondary | ICD-10-CM | POA: Diagnosis not present

## 2020-03-19 DIAGNOSIS — E785 Hyperlipidemia, unspecified: Secondary | ICD-10-CM | POA: Diagnosis not present

## 2020-03-19 DIAGNOSIS — I4892 Unspecified atrial flutter: Secondary | ICD-10-CM | POA: Diagnosis not present

## 2020-03-19 DIAGNOSIS — Z9889 Other specified postprocedural states: Secondary | ICD-10-CM | POA: Diagnosis not present

## 2020-03-19 DIAGNOSIS — Z7901 Long term (current) use of anticoagulants: Secondary | ICD-10-CM | POA: Diagnosis not present

## 2020-03-19 DIAGNOSIS — I10 Essential (primary) hypertension: Secondary | ICD-10-CM | POA: Diagnosis not present

## 2020-03-20 LAB — COMPREHENSIVE METABOLIC PANEL
ALT: 12 IU/L (ref 0–44)
AST: 11 IU/L (ref 0–40)
Albumin/Globulin Ratio: 2 (ref 1.2–2.2)
Albumin: 4.7 g/dL (ref 3.8–4.8)
Alkaline Phosphatase: 76 IU/L (ref 39–117)
BUN/Creatinine Ratio: 18 (ref 10–24)
BUN: 21 mg/dL (ref 8–27)
Bilirubin Total: 0.4 mg/dL (ref 0.0–1.2)
CO2: 26 mmol/L (ref 20–29)
Calcium: 9.6 mg/dL (ref 8.6–10.2)
Chloride: 101 mmol/L (ref 96–106)
Creatinine, Ser: 1.19 mg/dL (ref 0.76–1.27)
GFR calc Af Amer: 72 mL/min/{1.73_m2} (ref >59)
GFR calc non Af Amer: 62 mL/min/{1.73_m2} (ref >59)
Globulin, Total: 2.4 g/dL (ref 1.5–4.5)
Glucose: 140 mg/dL — ABNORMAL HIGH (ref 65–99)
Potassium: 4.7 mmol/L (ref 3.5–5.2)
Sodium: 141 mmol/L (ref 134–144)
Total Protein: 7.1 g/dL (ref 6.0–8.5)

## 2020-03-20 LAB — LIPID PANEL
Chol/HDL Ratio: 5 ratio (ref 0.0–5.0)
Cholesterol, Total: 156 mg/dL (ref 100–199)
HDL: 31 mg/dL — ABNORMAL LOW (ref >39)
LDL Chol Calc (NIH): 104 mg/dL — ABNORMAL HIGH (ref 0–99)
Triglycerides: 116 mg/dL (ref 0–149)
VLDL Cholesterol Cal: 21 mg/dL (ref 5–40)

## 2020-03-20 LAB — CBC
Hematocrit: 43.1 % (ref 37.5–51.0)
Hemoglobin: 14.9 g/dL (ref 13.0–17.7)
MCH: 28.2 pg (ref 26.6–33.0)
MCHC: 34.6 g/dL (ref 31.5–35.7)
MCV: 82 fL (ref 79–97)
Platelets: 156 10*3/uL (ref 150–450)
RBC: 5.28 x10E6/uL (ref 4.14–5.80)
RDW: 12.6 % (ref 11.6–15.4)
WBC: 6.5 10*3/uL (ref 3.4–10.8)

## 2020-03-20 LAB — HEMOGLOBIN A1C
Est. average glucose Bld gHb Est-mCnc: 154 mg/dL
Hgb A1c MFr Bld: 7 % — ABNORMAL HIGH (ref 4.8–5.6)

## 2020-03-22 ENCOUNTER — Ambulatory Visit (INDEPENDENT_AMBULATORY_CARE_PROVIDER_SITE_OTHER): Payer: PPO | Admitting: *Deleted

## 2020-03-22 ENCOUNTER — Other Ambulatory Visit: Payer: Self-pay

## 2020-03-22 DIAGNOSIS — Z5181 Encounter for therapeutic drug level monitoring: Secondary | ICD-10-CM

## 2020-03-22 DIAGNOSIS — I4892 Unspecified atrial flutter: Secondary | ICD-10-CM

## 2020-03-22 DIAGNOSIS — I4891 Unspecified atrial fibrillation: Secondary | ICD-10-CM | POA: Diagnosis not present

## 2020-03-22 LAB — POCT INR: INR: 2.4 (ref 2.0–3.0)

## 2020-03-22 NOTE — Patient Instructions (Signed)
Continue warfarin 1 1/2 tablets daily except 1 tablet on Tuesdays and Fridays °Recheck in 6 weeks °

## 2020-04-23 ENCOUNTER — Encounter: Payer: Self-pay | Admitting: Family Medicine

## 2020-04-23 ENCOUNTER — Ambulatory Visit (INDEPENDENT_AMBULATORY_CARE_PROVIDER_SITE_OTHER): Payer: PPO | Admitting: Family Medicine

## 2020-04-23 ENCOUNTER — Other Ambulatory Visit: Payer: Self-pay

## 2020-04-23 VITALS — BP 145/93 | HR 82 | Temp 98.0°F | Ht 73.0 in | Wt 174.0 lb

## 2020-04-23 DIAGNOSIS — I10 Essential (primary) hypertension: Secondary | ICD-10-CM | POA: Diagnosis not present

## 2020-04-23 DIAGNOSIS — E119 Type 2 diabetes mellitus without complications: Secondary | ICD-10-CM

## 2020-04-23 DIAGNOSIS — I25119 Atherosclerotic heart disease of native coronary artery with unspecified angina pectoris: Secondary | ICD-10-CM

## 2020-04-23 DIAGNOSIS — E785 Hyperlipidemia, unspecified: Secondary | ICD-10-CM | POA: Diagnosis not present

## 2020-04-23 DIAGNOSIS — R0609 Other forms of dyspnea: Secondary | ICD-10-CM

## 2020-04-23 DIAGNOSIS — Z9889 Other specified postprocedural states: Secondary | ICD-10-CM

## 2020-04-23 DIAGNOSIS — R0600 Dyspnea, unspecified: Secondary | ICD-10-CM

## 2020-04-25 ENCOUNTER — Ambulatory Visit: Payer: PPO | Admitting: Family Medicine

## 2020-04-26 ENCOUNTER — Ambulatory Visit: Payer: PPO | Admitting: Cardiology

## 2020-04-26 ENCOUNTER — Other Ambulatory Visit: Payer: Self-pay

## 2020-04-26 ENCOUNTER — Encounter: Payer: Self-pay | Admitting: Cardiology

## 2020-04-26 ENCOUNTER — Encounter: Payer: Self-pay | Admitting: *Deleted

## 2020-04-26 VITALS — BP 141/87 | HR 80 | Ht 72.0 in | Wt 173.6 lb

## 2020-04-26 DIAGNOSIS — R0600 Dyspnea, unspecified: Secondary | ICD-10-CM | POA: Diagnosis not present

## 2020-04-26 DIAGNOSIS — I2510 Atherosclerotic heart disease of native coronary artery without angina pectoris: Secondary | ICD-10-CM | POA: Insufficient documentation

## 2020-04-26 DIAGNOSIS — I25119 Atherosclerotic heart disease of native coronary artery with unspecified angina pectoris: Secondary | ICD-10-CM | POA: Diagnosis not present

## 2020-04-26 DIAGNOSIS — R0609 Other forms of dyspnea: Secondary | ICD-10-CM

## 2020-04-26 DIAGNOSIS — Z7901 Long term (current) use of anticoagulants: Secondary | ICD-10-CM

## 2020-04-26 DIAGNOSIS — I471 Supraventricular tachycardia: Secondary | ICD-10-CM | POA: Diagnosis not present

## 2020-04-26 DIAGNOSIS — E785 Hyperlipidemia, unspecified: Secondary | ICD-10-CM

## 2020-04-26 DIAGNOSIS — E119 Type 2 diabetes mellitus without complications: Secondary | ICD-10-CM | POA: Diagnosis not present

## 2020-04-26 DIAGNOSIS — I10 Essential (primary) hypertension: Secondary | ICD-10-CM | POA: Diagnosis not present

## 2020-04-26 DIAGNOSIS — Z9889 Other specified postprocedural states: Secondary | ICD-10-CM | POA: Diagnosis not present

## 2020-04-26 DIAGNOSIS — R079 Chest pain, unspecified: Secondary | ICD-10-CM | POA: Diagnosis not present

## 2020-04-26 NOTE — Assessment & Plan Note (Signed)
Pt seen 04/26/2020 with new DOE

## 2020-04-26 NOTE — Assessment & Plan Note (Signed)
New chest pain- not clearly exertional but with new DOE and a history of non obstructive CAD in 2011

## 2020-04-26 NOTE — Progress Notes (Signed)
Cardiology Office Note:    Date:  04/26/2020   ID:  Jacob [name], DOB 01-24-1951, MRN [id number]  PCP:  Maryruth Hancock, MD  Cardiologist:  [name], MD  Electrophysiologist:  None   Referring MD: Maryruth Hancock, MD   CC: New DOE  History of Present Illness:    Jacob [name] is a 69 y.o. male with a hx of a mitral valve repair in 2005. At that time he also had radiofrequency ablation for atrial tachycardia and his left atrial appendage was oversewn. He did have recurrent atrial tachycardia and saw Dr. Rayann Heman in 2019.  At that time the patient declined antiarrhythmic therapy. Fortunately he has apparently been holding normal sinus rhythm since. He saw Dr. Sallyanne Kuster in December 2020. Other medical issues include non-insulin-dependent diabetes, essential hypertension, untreated dyslipidemia, and mild coronary disease. He had normal coronaries at catheterization in 2005 and 2007. In 2011 catheterization revealed a 40% RCA. He is on chronic Coumadin therapy with a CHA DS VASC score of 3.  He recently got established with a new primary care provider. At that visit he did mention that he had noted new dyspnea on exertion. He typically walks at least 30 minutes a day. Over the last few weeks he has noted some dyspnea on exertion when walking uphill. He denies any lower extremity edema or orthopnea. He says he has had intermittent chest pain but it is not related to exertion.  Past Medical History:  Diagnosis Date  . A-fib ([place])   . CAD (coronary artery disease)   . Dyslipidemia   . Essential (primary) hypertension   . Gout   . Hyperlipidemia, unspecified   . Hypertension   . 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 Other fatigue   . Other specified soft tissue disorders   . Rheumatic mitral valve disease, unspecified   . 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 Type 2 diabetes mellitus without complications ([place])   . Unspecified  atrial fibrillation [facility])     Past Surgical History:  Procedure Laterality Date  . CARDIAC CATHETERIZATION  12/03/04   sign. one vessel disease,severe MR  . CARDIAC CATHETERIZATION  01/19/06   normal  . CARDIAC CATHETERIZATION  06/06/10   mild nonobstructive CAD  . cyst  1980   removed from wrist  . MITRAL VALVE SURGERY  12/11/04   MV repair with Oletta Lamas ring,closure of PFO  . RADIOFREQUENCY ABLATION  12/02/04   Dr. Emelda Brothers  . SALIVARY GLAND SURGERY  2004   growth  . TONSILLECTOMY  1959    Current Medications: Current Meds  Medication Sig  . acetaminophen (TYLENOL) 650 MG CR tablet Take 1,300 mg by mouth every 6 (six) hours as needed for pain.  Marland Kitchen allopurinol (ZYLOPRIM) 300 MG tablet Take 1 tablet (300 mg total) by mouth daily.  Marland Kitchen lisinopril (ZESTRIL) 20 MG tablet Take 1 tablet (20 mg total) by mouth daily.  . metFORMIN (GLUCOPHAGE) 500 MG tablet Take 1 tablet (500 mg total) by mouth 3 (three) times daily.  Marland Kitchen torsemide (DEMADEX) 20 MG tablet Take 1-2 tablets (20-40 mg total) by mouth daily.  Marland Kitchen warfarin (COUMADIN) 5 MG tablet Take 1 1/2 tablets daily except 1 tablet on Tuesdays and Fridays     Allergies:   Cardizem [diltiazem hcl], Clindamycin/lincomycin, and Warfarin and related   Social History   Socioeconomic History  . Marital status: Married    Spouse name: Not on  file  . Number of children: Not on file  . Years of education: Not on file  . Highest education level: Not on file  Occupational History  . Not on file  Tobacco Use  . Smoking status: Former Research scientist (life sciences)  . Smokeless tobacco: Never Used  Substance and Sexual Activity  . Alcohol use: No  . Drug use: No  . Sexual activity: Not on file  Other Topics Concern  . Not on file  Social History Narrative  . Not on file   Social Determinants of Health   Financial Resource Strain:   . Difficulty of Paying Living Expenses:   Food Insecurity:   . Worried About Charity fundraiser in the Last Year:   . Youth worker in the Last Year:   Transportation Needs:   . Film/video editor (Medical):   Marland Kitchen Lack of Transportation (Non-Medical):   Physical Activity:   . Days of Exercise per Week:   . Minutes of Exercise per Session:   Stress:   . Feeling of Stress :   Social Connections:   . Frequency of Communication with Friends and Family:   . Frequency of Social Gatherings with Friends and Family:   . Attends Religious Services:   . Active Member of Clubs or Organizations:   . Attends Archivist Meetings:   Marland Kitchen Marital Status:      Family History: The patient's family history includes Cancer in his mother.  ROS:   Please see the history of present illness.   The patient has declined statin Rx. He recently has agreed to get COVID vaccine.   All other systems reviewed and are negative.  EKGs/Labs/Other Studies Reviewed:    The following studies were reviewed today: Echo April 2017  EKG:  EKG is ordered today.  The ekg ordered today demonstrates NSR, HR 79  Recent Labs: 05/27/2019: TSH 1.11 03/19/2020: ALT 12; BUN 21; Creatinine, Ser 1.19; Hemoglobin 14.9; Platelets 156; Potassium 4.7; Sodium 141  Recent Lipid Panel    Component Value Date/Time   CHOL 156 03/19/2020 0807   TRIG 116 03/19/2020 0807   HDL 31 (L) 03/19/2020 0807   CHOLHDL 5.0 03/19/2020 0807   CHOLHDL 4.7 01/27/2019 0832   VLDL 15 01/27/2019 0832   LDLCALC 104 (H) 03/19/2020 0807    Physical Exam:    VS:  BP (!) 141/87   Pulse 80   Ht 6' (1.829 m)   Wt 173 lb 9.6 oz (78.7 kg)   SpO2 100%   BMI 23.54 kg/m     Wt Readings from Last 3 Encounters:  04/26/20 173 lb 9.6 oz (78.7 kg)  04/23/20 174 lb (78.9 kg)  03/13/20 174 lb (78.9 kg)     GEN:  Well nourished, well developed in no acute distress HEENT: Normal NECK: No JVD; No carotid bruits CARDIAC: RRR, no murmurs, rubs, gallops RESPIRATORY:  Clear to auscultation without rales, wheezing or rhonchi  ABDOMEN: Soft, non-tender,  non-distended MUSCULOSKELETAL:  No edema; No deformity  SKIN: Warm and dry NEUROLOGIC:  Alert and oriented x 3 PSYCHIATRIC:  Normal affect   ASSESSMENT:    DOE (dyspnea on exertion) Pt seen 04/26/2020 with new DOE  Chest pain with moderate risk for cardiac etiology New chest pain- not clearly exertional but with new DOE and a history of non obstructive CAD in 2011  CAD (coronary artery disease) Normal coronaries in 2005-2007. In 2011 he had 40% RCA  H/O mitral valve repair S/p MV  annuloplasty 28 mm ring (2005) for severe MR; [name]. Left atrial appendage was oversown. 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  Pt declines statin Rx. After discussion he will consider Red Yeast rice  Essential (primary) hypertension Controlled  Diabetes mellitus type 2 in nonobese (HCC) Hgb A1c was 7.0 March 2021- he is on Glucophage  PLAN:    Plan: Plan exercise Myoview and echo.  F/U Dr Sallyanne Kuster.   Medication Adjustments/Labs and Tests Ordered: Current medicines are reviewed at length with the patient today.  Concerns regarding medicines are outlined above.  Orders Placed This Encounter  Procedures  . MYOCARDIAL PERFUSION IMAGING  . ECHOCARDIOGRAM COMPLETE   No orders of the defined types were placed in this encounter.   Patient Instructions  Medication Instructions:  No changes *If you need a refill on your cardiac medications before your next appointment, please call your pharmacy*   Lab Work: None ordered If you have labs (blood work) drawn today and your tests are completely normal, you will receive your results only by: Marland Kitchen MyChart Message (if you have MyChart) OR . A paper copy in the mail If you have any lab test that is abnormal or we need to change your treatment, we will call you to review the  results.   Testing/Procedures: Your physician has requested that you have an echocardiogram. Echocardiography is a painless test that uses sound waves to create images of your heart. It provides your doctor with information about the size and shape of your heart and how well your heart's chambers and valves are working. You may receive an ultrasound enhancing agent through an IV if needed to better visualize your heart during the echo.This procedure takes approximately one hour. There are no restrictions for this procedure. This will take place at the [address], [address].   Your physician has requested that you have an exercise stress myoview. For further information please visit [email or web address]. Please follow instruction sheet, as given. This will take place at [place], [address]  How to prepare for your Myocardial Perfusion Test:  Do not eat or drink 3 hours prior to your test, except you may have water.  Do not consume products containing caffeine (regular or decaffeinated) 12 hours prior to your test. (ex: coffee, chocolate, sodas, tea).  Do bring a list of your current medications with you.  If not listed below, you may take your medications as normal.  Do wear comfortable clothes (no dresses or overalls) and walking shoes, tennis shoes preferred (No heels or open toe shoes are allowed).  Do NOT wear cologne, perfume, aftershave, or lotions (deodorant is allowed).  The test will take approximately 3 to 4 hours to complete  If these instructions are not followed, your test will have to be rescheduled.  You will need a covid test prior to the test. Hold the Torsemide the morning of the test.   Follow-Up: At [facility], you and your health needs are our priority.  As part of our continuing mission to provide you with exceptional heart care, we have created designated Provider Care Teams.  These Care Teams include your primary Cardiologist (physician) and  Advanced Practice Providers (APPs -  Physician Assistants and Nurse Practitioners) who all work together to provide you with the care you need, when you need it.  We recommend signing up for the  patient portal called "MyChart".  Sign up information is provided on this After Visit Summary.  MyChart is used to connect with patients for Virtual Visits (Telemedicine).  Patients are able to view lab/test results, encounter notes, upcoming appointments, etc.  Non-urgent messages can be sent to your provider as well.   To learn more about what you can do with MyChart, go to [email or web address].    Your next appointment:   Follow up with Dr. Sallyanne Kuster in 2 months.       Angelena Form, PA-C  04/26/2020 8:47 AM    [facility]

## 2020-04-26 NOTE — Assessment & Plan Note (Signed)
Hgb A1c was 7.0 March 2021- he is on Glucophage

## 2020-04-26 NOTE — Progress Notes (Signed)
New Patient Office Visit  Subjective:  Patient ID: Jacob [name], male    DOB: 24-Mar-1951  Age: 69 y.o. MRN: [id number]  CC:  CAD/HTN/DM/GOUT/Hyperlipidemia  HPI [name] presents for SOB with walking-h/o DM/CAD/afib-mitral valve disease-last echo 2017 DM-glucophage-7/0%, cmp-renal function stable-3/21-Dr. Katy Fitch -diabetic eye exam-diagnosis 2015 Hyperlipidemia-LDL 104 HTN-Demadex/zestril-renal function stable 3/21 Gout-allopurinol Past Medical History:  Diagnosis Date  . A-fib ([place])   . CAD (coronary artery disease)   . Dyslipidemia   . Essential (primary) hypertension   . Gout   . Hyperlipidemia, unspecified   . Hypertension   . 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 Other fatigue   . Other specified soft tissue disorders   . Rheumatic mitral valve disease, unspecified   . 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 Type 2 diabetes mellitus without complications ([place])   . Unspecified atrial fibrillation [facility])     Past Surgical History:  Procedure Laterality Date  . CARDIAC CATHETERIZATION  12/03/04   sign. one vessel disease,severe MR  . CARDIAC CATHETERIZATION  01/19/06   normal  . CARDIAC CATHETERIZATION  06/06/10   mild nonobstructive CAD  . cyst  1980   removed from wrist  . MITRAL VALVE SURGERY  12/11/04   MV repair with Oletta Lamas ring,closure of PFO  . RADIOFREQUENCY ABLATION  12/02/04   Dr. Emelda Brothers  . SALIVARY GLAND SURGERY  2004   growth  . TONSILLECTOMY  1959    Family History  Problem Relation Age of Onset  . Cancer Mother        non-hodgkins lymphoma    Social History   Socioeconomic History  . Marital status: Married    Spouse name: Not on file  . Number of children: Not on file  . Years of education: Not on file  . Highest education level: Not on file  Occupational History  . Not on file  Tobacco Use  . Smoking status: Former Research scientist (life sciences)  . Smokeless tobacco: Never Used  Substance  and Sexual Activity  . Alcohol use: No  . Drug use: No  . Sexual activity: Not on file  Other Topics Concern  . Not on file  Social History Narrative  . Not on file   Social Determinants of Health   Financial Resource Strain:   . Difficulty of Paying Living Expenses:   Food Insecurity:   . Worried About Charity fundraiser in the Last Year:   . Arboriculturist in the Last Year:   Transportation Needs:   . Film/video editor (Medical):   Marland Kitchen Lack of Transportation (Non-Medical):   Physical Activity:   . Days of Exercise per Week:   . Minutes of Exercise per Session:   Stress:   . Feeling of Stress :   Social Connections:   . Frequency of Communication with Friends and Family:   . Frequency of Social Gatherings with Friends and Family:   . Attends Religious Services:   . Active Member of Clubs or Organizations:   . Attends Archivist Meetings:   Marland Kitchen Marital Status:   Intimate Partner Violence:   . Fear of Current or Ex-Partner:   . Emotionally Abused:   Marland Kitchen Physically Abused:   . Sexually Abused:     ROS Review of Systems  Constitutional: Negative.   Respiratory: Negative.   Cardiovascular:       Decrease exercise tolerance  Endocrine:  DM  Genitourinary: Negative.   Psychiatric/Behavioral: Positive for sleep disturbance.       Difficulty sleeping    Objective:   Today's Vitals: BP (!) 145/93 (BP Location: Right Arm, Patient Position: Sitting)   Pulse 82   Temp 98 F (36.7 C) (Temporal)   Ht 6\' 1"  (1.854 m)   Wt 174 lb (78.9 kg)   SpO2 98%   BMI 22.96 kg/m   Physical Exam Constitutional:      Appearance: Normal appearance.  Cardiovascular:     Rate and Rhythm: Normal rate and regular rhythm.     Pulses: Normal pulses.     Heart sounds: Murmur present.  Pulmonary:     Effort: Pulmonary effort is normal.     Breath sounds: Normal breath sounds.  Musculoskeletal:     Cervical back: Normal range of motion and neck supple.     Right  lower leg: No edema.     Left lower leg: No edema.  Neurological:     Mental Status: He is alert and oriented to person, place, and time.  Psychiatric:        Mood and Affect: Mood normal.        Behavior: Behavior normal.       Outpatient Encounter Medications as of 04/23/2020  Medication Sig  . acetaminophen (TYLENOL) 650 MG CR tablet Take 1,300 mg by mouth every 6 (six) hours as needed for pain.  Marland Kitchen allopurinol (ZYLOPRIM) 300 MG tablet Take 1 tablet (300 mg total) by mouth daily.  Marland Kitchen lisinopril (ZESTRIL) 20 MG tablet Take 1 tablet (20 mg total) by mouth daily.  . metFORMIN (GLUCOPHAGE) 500 MG tablet Take 1 tablet (500 mg total) by mouth 3 (three) times daily.  Marland Kitchen torsemide (DEMADEX) 20 MG tablet Take 1-2 tablets (20-40 mg total) by mouth daily.  Marland Kitchen warfarin (COUMADIN) 5 MG tablet Take 1 1/2 tablets daily except 1 tablet on Tuesdays and Fridays   No facility-administered encounter medications on file as of 04/23/2020.   1. DOE (dyspnea on exertion) Call cardiology-concern for change in status-no recent echo-pt will do self referral  2. Coronary artery disease involving native coronary artery of native heart with angina pectoris [facility]) Concern for DOE, no signs of CHF-known CAD, mitral valve repair  3. Hyperlipidemia, unspecified hyperlipidemia type LDL 122  4. Essential (primary) hypertension Demedex/zestril-renal function normal 3/21  5. Diabetes mellitus type 2 in nonobese (HCC) A1c 7.0% 3/21-glucophage TID with FOOD  6. H/O mitral valve repair-concern for exercise tolerance changes Cardiology asap-pt to make self referral Follow-up: No follow-ups on file.   Malaia Buchta Hannah Beat, MD

## 2020-04-26 NOTE — Assessment & Plan Note (Signed)
S/p slow pathway modification 2005

## 2020-04-26 NOTE — Assessment & Plan Note (Signed)
[id number]- June 2020.  Pt declines statin Rx. After discussion he will consider Red Yeast rice

## 2020-04-26 NOTE — Assessment & Plan Note (Signed)
CHADS VASC=3- on Coumadin

## 2020-04-26 NOTE — Assessment & Plan Note (Signed)
Normal coronaries in 2005-2007. In 2011 he had 40% RCA

## 2020-04-26 NOTE — Assessment & Plan Note (Signed)
S/p MV annuloplasty 28 mm ring (2005) for severe MR; [name]. Left atrial appendage was oversown. PFO was closed

## 2020-04-26 NOTE — Assessment & Plan Note (Signed)
Controlled.  

## 2020-04-26 NOTE — Patient Instructions (Signed)
Medication Instructions:  No changes *If you need a refill on your cardiac medications before your next appointment, please call your pharmacy*   Lab Work: None ordered If you have labs (blood work) drawn today and your tests are completely normal, you will receive your results only by: Marland Kitchen MyChart Message (if you have MyChart) OR . A paper copy in the mail If you have any lab test that is abnormal or we need to change your treatment, we will call you to review the results.   Testing/Procedures: Your physician has requested that you have an echocardiogram. Echocardiography is a painless test that uses sound waves to create images of your heart. It provides your doctor with information about the size and shape of your heart and how well your heart's chambers and valves are working. You may receive an ultrasound enhancing agent through an IV if needed to better visualize your heart during the echo.This procedure takes approximately one hour. There are no restrictions for this procedure. This will take place at the [address], [address].   Your physician has requested that you have an exercise stress myoview. For further information please visit [email or web address]. Please follow instruction sheet, as given. This will take place at [place], [address]  How to prepare for your Myocardial Perfusion Test:  Do not eat or drink 3 hours prior to your test, except you may have water.  Do not consume products containing caffeine (regular or decaffeinated) 12 hours prior to your test. (ex: coffee, chocolate, sodas, tea).  Do bring a list of your current medications with you.  If not listed below, you may take your medications as normal.  Do wear comfortable clothes (no dresses or overalls) and walking shoes, tennis shoes preferred (No heels or open toe shoes are allowed).  Do NOT wear cologne, perfume, aftershave, or lotions (deodorant is allowed).  The test will take approximately 3  to 4 hours to complete  If these instructions are not followed, your test will have to be rescheduled.  You will need a covid test prior to the test. Hold the Torsemide the morning of the test.   Follow-Up: At [facility], you and your health needs are our priority.  As part of our continuing mission to provide you with exceptional heart care, we have created designated Provider Care Teams.  These Care Teams include your primary Cardiologist (physician) and Advanced Practice Providers (APPs -  Physician Assistants and Nurse Practitioners) who all work together to provide you with the care you need, when you need it.  We recommend signing up for the patient portal called "MyChart".  Sign up information is provided on this After Visit Summary.  MyChart is used to connect with patients for Virtual Visits (Telemedicine).  Patients are able to view lab/test results, encounter notes, upcoming appointments, etc.  Non-urgent messages can be sent to your provider as well.   To learn more about what you can do with MyChart, go to [email or web address].    Your next appointment:   Follow up with Dr. Sallyanne Kuster in 2 months.

## 2020-05-02 ENCOUNTER — Other Ambulatory Visit: Payer: Self-pay | Admitting: Cardiology

## 2020-05-02 DIAGNOSIS — R0609 Other forms of dyspnea: Secondary | ICD-10-CM

## 2020-05-02 DIAGNOSIS — I059 Rheumatic mitral valve disease, unspecified: Secondary | ICD-10-CM

## 2020-05-03 ENCOUNTER — Telehealth (HOSPITAL_COMMUNITY): Payer: Self-pay

## 2020-05-03 ENCOUNTER — Telehealth: Payer: Self-pay | Admitting: Cardiovascular Disease

## 2020-05-03 ENCOUNTER — Ambulatory Visit (INDEPENDENT_AMBULATORY_CARE_PROVIDER_SITE_OTHER): Payer: PPO | Admitting: *Deleted

## 2020-05-03 ENCOUNTER — Other Ambulatory Visit: Payer: Self-pay

## 2020-05-03 DIAGNOSIS — I4892 Unspecified atrial flutter: Secondary | ICD-10-CM

## 2020-05-03 DIAGNOSIS — I4891 Unspecified atrial fibrillation: Secondary | ICD-10-CM

## 2020-05-03 DIAGNOSIS — Z5181 Encounter for therapeutic drug level monitoring: Secondary | ICD-10-CM

## 2020-05-03 LAB — POCT INR: INR: 2.6 (ref 2.0–3.0)

## 2020-05-03 NOTE — Patient Instructions (Signed)
Continue warfarin 1 1/2 tablets daily except 1 tablet on Tuesdays and Fridays °Recheck in 6 weeks °

## 2020-05-03 NOTE — Telephone Encounter (Signed)
   Went to chart to check who called pt. Gave [facility] imaging number to callback Estill Bamberg

## 2020-05-03 NOTE — Telephone Encounter (Signed)
Encounter complete. 

## 2020-05-04 ENCOUNTER — Other Ambulatory Visit (HOSPITAL_COMMUNITY)
Admission: RE | Admit: 2020-05-04 | Discharge: 2020-05-04 | Disposition: A | Discharge status: Home / Self Care | Payer: PPO | Source: Ambulatory Visit | Attending: Cardiology | Admitting: Cardiology

## 2020-05-04 DIAGNOSIS — Z01812 Encounter for preprocedural laboratory examination: Secondary | ICD-10-CM | POA: Insufficient documentation

## 2020-05-04 DIAGNOSIS — Z20822 Contact with and (suspected) exposure to covid-19: Secondary | ICD-10-CM | POA: Insufficient documentation

## 2020-05-04 LAB — SARS CORONAVIRUS 2 (TAT 6-24 HRS): SARS Coronavirus 2: NEGATIVE

## 2020-05-08 ENCOUNTER — Other Ambulatory Visit: Payer: Self-pay

## 2020-05-08 ENCOUNTER — Ambulatory Visit (HOSPITAL_COMMUNITY)
Admission: RE | Admit: 2020-05-08 | Discharge: 2020-05-08 | Disposition: A | Discharge status: Home / Self Care | Payer: PPO | Source: Ambulatory Visit | Attending: Cardiology | Admitting: Cardiology

## 2020-05-08 DIAGNOSIS — I25119 Atherosclerotic heart disease of native coronary artery with unspecified angina pectoris: Secondary | ICD-10-CM | POA: Insufficient documentation

## 2020-05-08 DIAGNOSIS — R0600 Dyspnea, unspecified: Secondary | ICD-10-CM | POA: Insufficient documentation

## 2020-05-08 LAB — MYOCARDIAL PERFUSION IMAGING
LV dias vol: 80 mL (ref 62–150)
LV sys vol: 32 mL
Peak HR: 99 {beats}/min
Rest HR: 67 {beats}/min
SDS: 0
SRS: 3
SSS: 3
TID: 0.89

## 2020-05-08 MED ORDER — REGADENOSON 0.4 MG/5ML IV SOLN
0.4000 mg | Freq: Once | INTRAVENOUS | Status: AC
Start: 2020-05-08 — End: 2020-05-08
  Administered 2020-05-08: 0.4 mg via INTRAVENOUS

## 2020-05-08 MED ORDER — TECHNETIUM TC 99M TETROFOSMIN IV KIT
30.6000 | PACK | Freq: Once | INTRAVENOUS | Status: AC | PRN
  Administered 2020-05-08: 30.6 via INTRAVENOUS
  Filled 2020-05-08: qty 31

## 2020-05-08 MED ORDER — TECHNETIUM TC 99M TETROFOSMIN IV KIT
9.5000 | PACK | Freq: Once | INTRAVENOUS | Status: AC | PRN
  Administered 2020-05-08: 9.5 via INTRAVENOUS
  Filled 2020-05-08: qty 10

## 2020-05-14 ENCOUNTER — Encounter: Payer: Self-pay | Admitting: Cardiology

## 2020-05-14 ENCOUNTER — Telehealth: Payer: Self-pay | Admitting: Cardiology

## 2020-05-14 MED ORDER — BISOPROLOL FUMARATE 5 MG PO TABS: 5.0000 mg | ORAL_TABLET | Freq: Every day | ORAL | 3 refills | Status: DC

## 2020-05-14 NOTE — Telephone Encounter (Signed)
rx sent to pharmacy

## 2020-05-14 NOTE — Addendum Note (Signed)
Addended by: Patria Mane A on: 05/14/2020 08:52 AM   Modules accepted: Orders

## 2020-05-14 NOTE — Telephone Encounter (Signed)
I called the patient to review his nuclear stress test findings.  These had been reviewed with Dr. Debara Pickett.  It appears he had some breakthrough PSVT with exercise that was symptomatic.  In the past the patient has been intolerant to metoprolol as well as atenolol, both of which cause mental status changes.  His wife tells me he became very angry, "a totally different person".  He is also been intolerant to diltiazem.  I suggested we try bisoprolol 5 mg daily.  The patient remains asymptomatic at this time with his usual activities.  He has a follow-up with Dr. Sallyanne Kuster in August and will keep this but will call if he has problems before that visit.  Kerin Ransom PA-C 05/14/2020 8:46 AM

## 2020-05-14 NOTE — Addendum Note (Signed)
Addended by: Wonda Horner on: 05/14/2020 03:01 PM   Modules accepted: Orders

## 2020-05-24 ENCOUNTER — Ambulatory Visit (INDEPENDENT_AMBULATORY_CARE_PROVIDER_SITE_OTHER): Payer: PPO

## 2020-05-24 ENCOUNTER — Other Ambulatory Visit: Payer: Self-pay

## 2020-05-24 DIAGNOSIS — R0600 Dyspnea, unspecified: Secondary | ICD-10-CM | POA: Diagnosis not present

## 2020-05-24 DIAGNOSIS — I059 Rheumatic mitral valve disease, unspecified: Secondary | ICD-10-CM | POA: Diagnosis not present

## 2020-06-14 ENCOUNTER — Other Ambulatory Visit: Payer: Self-pay

## 2020-06-14 ENCOUNTER — Other Ambulatory Visit: Payer: Self-pay | Admitting: Cardiology

## 2020-06-14 ENCOUNTER — Ambulatory Visit (INDEPENDENT_AMBULATORY_CARE_PROVIDER_SITE_OTHER): Payer: PPO | Admitting: *Deleted

## 2020-06-14 DIAGNOSIS — Z5181 Encounter for therapeutic drug level monitoring: Secondary | ICD-10-CM | POA: Diagnosis not present

## 2020-06-14 DIAGNOSIS — I4891 Unspecified atrial fibrillation: Secondary | ICD-10-CM | POA: Diagnosis not present

## 2020-06-14 DIAGNOSIS — I4892 Unspecified atrial flutter: Secondary | ICD-10-CM | POA: Diagnosis not present

## 2020-06-14 LAB — POCT INR: INR: 2.4 (ref 2.0–3.0)

## 2020-06-14 NOTE — Patient Instructions (Signed)
Continue warfarin 1 1/2 tablets daily except 1 tablet on Tuesdays and Fridays °Recheck in 6 weeks °

## 2020-06-22 ENCOUNTER — Other Ambulatory Visit: Payer: Self-pay | Admitting: Cardiovascular Disease

## 2020-06-22 MED ORDER — TORSEMIDE 20 MG PO TABS: 20.0000 mg | ORAL_TABLET | Freq: Every day | ORAL | 1 refills | Status: AC

## 2020-06-22 NOTE — Telephone Encounter (Signed)
*  STAT* If patient is at the pharmacy, call can be transferred to refill team.   1. Which medications need to be refilled? (please list name of each medication and dose if known)  torsemide (DEMADEX) 20 MG tablet  2. Which pharmacy/location (including street and city if local pharmacy) is medication to be sent to? [place], [address]  3. Do they need a 30 day or 90 day supply? 90 day supply

## 2020-07-10 ENCOUNTER — Other Ambulatory Visit: Payer: Self-pay | Admitting: Cardiology

## 2020-07-26 ENCOUNTER — Ambulatory Visit (INDEPENDENT_AMBULATORY_CARE_PROVIDER_SITE_OTHER): Payer: PPO | Admitting: *Deleted

## 2020-07-26 DIAGNOSIS — I4891 Unspecified atrial fibrillation: Secondary | ICD-10-CM

## 2020-07-26 DIAGNOSIS — Z5181 Encounter for therapeutic drug level monitoring: Secondary | ICD-10-CM

## 2020-07-26 DIAGNOSIS — I4892 Unspecified atrial flutter: Secondary | ICD-10-CM | POA: Diagnosis not present

## 2020-07-26 LAB — POCT INR: INR: 2.5 (ref 2.0–3.0)

## 2020-07-26 NOTE — Patient Instructions (Signed)
Continue warfarin 1 1/2 tablets daily except 1 tablet on Tuesdays and Fridays °Recheck in 6 weeks °

## 2020-07-27 ENCOUNTER — Encounter: Payer: Self-pay | Admitting: Cardiovascular Disease

## 2020-07-27 ENCOUNTER — Ambulatory Visit: Payer: PPO | Admitting: Cardiovascular Disease

## 2020-07-27 ENCOUNTER — Other Ambulatory Visit: Payer: Self-pay

## 2020-07-27 VITALS — BP 126/86 | HR 80 | Ht 73.0 in | Wt 169.8 lb

## 2020-07-27 DIAGNOSIS — Z9889 Other specified postprocedural states: Secondary | ICD-10-CM

## 2020-07-27 DIAGNOSIS — I2510 Atherosclerotic heart disease of native coronary artery without angina pectoris: Secondary | ICD-10-CM

## 2020-07-27 DIAGNOSIS — E785 Hyperlipidemia, unspecified: Secondary | ICD-10-CM | POA: Diagnosis not present

## 2020-07-27 DIAGNOSIS — I10 Essential (primary) hypertension: Secondary | ICD-10-CM

## 2020-07-27 DIAGNOSIS — E119 Type 2 diabetes mellitus without complications: Secondary | ICD-10-CM

## 2020-07-27 DIAGNOSIS — I484 Atypical atrial flutter: Secondary | ICD-10-CM

## 2020-07-27 NOTE — Patient Instructions (Signed)

## 2020-07-27 NOTE — Progress Notes (Signed)
Cardiology Office Note    Date:  07/27/2020   ID:  Jacob [name], DOB [birth year], MRN [id number]  PCP:  Celene Squibb, MD  Cardiologist:   [name], MD   No chief complaint on file.   History of Present Illness:  Jacob [name] is a 69 y.o. male who presents for follow up for atrial arrhythmia and history of MV repair (2005, 28 mm annuloplasty ring, [name]) with oversewing of the left atrial appendage.  He has a history of ablation for AV node reentry in 2005 as well.  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  He saw Kerin Ransom in May 2021.  He had noticed some increasing dyspnea on exertion.  He was in normal rhythm at the time.  A follow-up echocardiogram was ordered, showing ventricular systolic function and mild increase in gradients across the mitral annulus (mean gradient 5 mmHg, heart rate 80 bpm).  The nuclear stress test performed in May showed a normal pattern of perfusion except for small apical attenuation artifact.  He generally feels well but developed shortness of breath and fatigue when he has rapid palpitations.  Sometimes he will just do it for 3 or 4 hours until it passes.  He has not had severe dyspnea, syncope, chest pain.  He is compliant with anticoagulation with warfarin and his INR was 2.5 yesterday.  He is not any falls or serious bleeding problems.  Blood pressure remains well controlled.  He has lost weight and his most recent BMI was 23 and his most recent hemoglobin A1c was 7%.  He has not had problems of angina pectoris.  He takes a low-dose of diuretic on a daily basis to prevent lower extremity edema.  He has not had orthopnea or PND.  He had stopped taking his beta-blocker since it caused depression anxiety.  He felt better after stopping it.  He is intolerant of statins which have produced personality changes every time he has tried to take them.  In January 2016, he developed very rapid atrial tachycardia  (scar related left atrial flutter?) at about 220 bpm during exercise testing. This is likely the cause of his intermittent palpitations. 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 He has been poorly tolerant to multiple beta blockers (metoprolol and carvedilol cause fatigue, nebivolol made him to "always be in a bad mood").  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 At that time his left atrial appendage was oversewn and a patent foramen ovale was closed. Preoperative cardiac catheterization showed noncritical coronary atherosclerosis. Repeat cardiac catheterization in 2011 again showed minimal irregularities, the worst lesion being a 40% stenosis in the mid right coronary artery. He has had a couple of normal nuclear stress tests, most recently in 2009 and normal treadmill stress test most recently January 2016.   He also has a history of hypertension and dyslipidemia and weight gain related diabetes mellitus type 2. He quit smoking in 1986.    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 He cannot wear an event monitor since he is allergic to the adhesive pads. He was intolerant to metoprolol and carvedilol (fatigue). Bystolic caused excessive  irritability.  Most recent echo in June 2021 showed normal left ventricular systolic function, EF 60 to 65%, no residual mitral regurgitation and mild transmitral gradient of 5 mmHg at 80 bpm.  A nuclear stress test performed in May 2021 showed normal perfusion artifact.   Past Medical History:  Diagnosis Date  . A-fib ([place])   . CAD (coronary artery disease)   . Dyslipidemia   . Essential (primary) hypertension   . Gout    . Hyperlipidemia, unspecified   . Hypertension   . 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 Other fatigue   . Other specified soft tissue disorders   . Rheumatic mitral valve disease, unspecified   . 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 Type 2 diabetes mellitus without complications ([place])   . Unspecified atrial fibrillation [facility])     Past Surgical History:  Procedure Laterality Date  . CARDIAC CATHETERIZATION  12/03/04   sign. one vessel disease,severe MR  . CARDIAC CATHETERIZATION  01/19/06   normal  . CARDIAC CATHETERIZATION  06/06/10   mild nonobstructive CAD  . cyst  1980   removed from wrist  . MITRAL VALVE SURGERY  12/11/04   MV repair with Oletta Lamas ring,closure of PFO  . RADIOFREQUENCY ABLATION  12/02/04   Dr. Emelda Brothers  . SALIVARY GLAND SURGERY  2004   growth  . TONSILLECTOMY  1959    Outpatient Medications Prior to Visit  Medication Sig Dispense Refill  . acetaminophen (TYLENOL) 650 MG CR tablet Take 1,300 mg by mouth every 6 (six) hours as needed for pain.    Marland Kitchen allopurinol (ZYLOPRIM) 300 MG tablet TAKE 1 TABLET BY MOUTH ONCE A DAY. 90 tablet 0  . lisinopril (ZESTRIL) 20 MG tablet Take 1 tablet (20 mg total) by mouth daily. 90 tablet 3  . metFORMIN (GLUCOPHAGE) 500 MG tablet Take 1 tablet (500 mg total) by mouth 3 (three) times daily. 270 tablet 1  . torsemide (DEMADEX) 20 MG tablet Take 1-2 tablets (20-40 mg total) by mouth daily. 90 tablet 1  . warfarin (COUMADIN) 5 MG tablet Take 1 1/2 tablets daily except 1 tablet on Tuesdays and Fridays 45 tablet 6  . bisoprolol (ZEBETA) 5 MG tablet Take 1 tablet (5 mg total) by mouth daily. 90 tablet 3   No facility-administered medications prior to visit.     Allergies:   Atenolol, Cardizem [diltiazem hcl], Metoprolol, and Clindamycin/lincomycin   Social History   Socioeconomic History  . Marital status: Married    Spouse name: Not on file  . Number of children: Not  on file  . Years of education: Not on file  . Highest education level: Not on file  Occupational History  . Not on file  Tobacco Use  . Smoking status: Former Research scientist (life sciences)  . Smokeless tobacco: Never Used  Vaping Use  . Vaping Use: Never used  Substance and Sexual Activity  . Alcohol use: No  . Drug use: No  . Sexual activity: Not on file  Other Topics Concern  . Not on file  Social History Narrative  . Not on file   Social Determinants of Health   Financial Resource Strain:   . Difficulty of Paying Living Expenses:   Food Insecurity:   . Worried About Charity fundraiser in the Last Year:   . Arboriculturist in the Last Year:   Transportation Needs:   . Film/video editor (Medical):   Marland Kitchen  Lack of Transportation (Non-Medical):   Physical Activity:   . Days of Exercise per Week:   . Minutes of Exercise per Session:   Stress:   . Feeling of Stress :   Social Connections:   . Frequency of Communication with Friends and Family:   . Frequency of Social Gatherings with Friends and Family:   . Attends Religious Services:   . Active Member of Clubs or Organizations:   . Attends Archivist Meetings:   Marland Kitchen Marital Status:      Family History:  The patient's family history includes Cancer in his mother.   ROS:   Please see the history of present illness.    ROS All other systems reviewed and are negative.   PHYSICAL EXAM:   VS:  BP 126/86   Pulse 80   Ht 6\' 1"  (1.854 m)   Wt 169 lb 12.8 oz (77 kg)   SpO2 99%   BMI 22.40 kg/m      General: Alert, oriented x3, no distress, lean and fit Head: no evidence of trauma, PERRL, EOMI, no exophtalmos or lid lag, no myxedema, no xanthelasma; normal ears, nose and oropharynx Neck: normal jugular venous pulsations and no hepatojugular reflux; brisk carotid pulses without delay and no carotid bruits Chest: clear to auscultation, no signs of consolidation by percussion or palpation, normal fremitus, symmetrical and full  respiratory excursions Cardiovascular: normal position and quality of the apical impulse, regular rhythm, normal first and second heart sounds, no murmurs, rubs or gallops Abdomen: no tenderness or distention, no masses by palpation, no abnormal pulsatility or arterial bruits, normal bowel sounds, no hepatosplenomegaly Extremities: no clubbing, cyanosis or edema; 2+ radial, ulnar and brachial pulses bilaterally; 2+ right femoral, posterior tibial and dorsalis pedis pulses; 2+ left femoral, posterior tibial and dorsalis pedis pulses; no subclavian or femoral bruits Neurological: grossly nonfocal Psych: Normal mood and affect    Wt Readings from Last 3 Encounters:  07/27/20 169 lb 12.8 oz (77 kg)  05/08/20 173 lb (78.5 kg)  04/26/20 173 lb 9.6 oz (78.7 kg)      Studies/Labs Reviewed:   EKG: EKG ordered today shows normal sinus rhythm, normal tracing, QTC 428 ms   ECHO 05/24/2020  1. Left ventricular ejection fraction, by estimation, is 60 to 65%. The  left ventricle has normal function. The left ventricle has no regional  wall motion abnormalities. Left ventricular diastolic parameters are  indeterminate.  2. Right ventricular systolic function is normal. The right ventricular  size is normal. There is normal pulmonary artery systolic pressure.  3. 28 mm Edwards physio angioplasty ring MV repair is present. Mild mean gradient across the valve of 5 mmHg. . The mitral valve has been  repaired/replaced. No evidence of mitral valve regurgitation.  4. The aortic valve has an indeterminant number of cusps. Aortic valve  regurgitation is not visualized. No aortic stenosis is present.  5. The inferior vena cava is normal in size with greater than 50%  respiratory variability, suggesting right atrial pressure of 3 mmHg.   BMET    Component Value Date/Time   NA 141 03/19/2020 0807   K 4.7 03/19/2020 0807   CL 101 03/19/2020 0807   CO2 26 03/19/2020 0807   GLUCOSE 140 (H)  03/19/2020 0807   GLUCOSE 148 (H) 01/27/2019 0831   BUN 21 03/19/2020 0807   CREATININE 1.19 03/19/2020 0807   CALCIUM 9.6 03/19/2020 0807   GFRNONAA 62 03/19/2020 0807   GFRAA 72 03/19/2020 0807  Lipid Panel     Component Value Date/Time   CHOL 156 03/19/2020 0807   TRIG 116 03/19/2020 0807   HDL 31 (L) 03/19/2020 0807   CHOLHDL 5.0 03/19/2020 0807   CHOLHDL 4.7 01/27/2019 0832   VLDL 15 01/27/2019 0832   LDLCALC 104 (H) 03/19/2020 0807   LABVLDL 21 03/19/2020 0807    ASSESSMENT:    1. Atypical atrial flutter ([place])   2. History of radiofrequency ablation procedure for cardiac arrhythmia   3. H/O mitral valve repair   4. Essential (primary) hypertension   5. Diabetes mellitus type 2 in nonobese ([facility])   6. Coronary artery disease involving native coronary artery of native heart without angina pectoris   7. Dyslipidemia (high LDL; low HDL)      PLAN:  In order of problems listed above:   1. Atypical atrial flutter: He does become symptomatic if he develops tachycardia.  He is not tolerant of beta-blockers on a daily basis.  We have tried both metoprolol and bisoprolol.  He also had side effects with diltiazem.  I suggested that he can take the beta-blocker on as-needed basis when the episodes of tachycardia occur.  See treadmill stress test ECG tracings from 2017. Most likely has scar reentry flutter in the left atrium.  We have never documented atrial fibrillation. In 2019, he was evaluated by Dr. Rayann Heman in the electrophysiology clinic and antiarrhythmics and ablation were discussed.  Jacob [name] does not want to take antiarrhythmics and he felt the risks of ablation were not justified as his symptoms are not that severe. CHADSVasc 3 (Age, HTN, DM).  On warfarin. He did have his left atrial appendage oversewn he should be at lower embolic risk. 2. 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 S/P mitral annuloplasty repair: Durable result.  No residual mitral  insufficiency and mildly increased gradients. Left atrial appendage was oversewn and PFO close at the time of surgery. 4. HTN: Good control. 5. DM: Good glycemic control.  Healthy weight.  Physically active. 6. CAD: Moderate stenosis of RCA (40%) at last cardiac catheterization.  Normal recent nuclear stress test.  No current problems with angina pectoris. 7. HLP: He has typical metabolic syndrome pattern of dyslipidemia, HDL remains low despite losing weight.  He has tried taking statins unsuccessfully, several times.  Unable to afford expensive brand-name medication such as Repatha.   Medication Adjustments/Labs and Tests Ordered: Current medicines are reviewed at length with the patient today.  Concerns regarding medicines are outlined above.  Medication changes, Labs and Tests ordered today are listed in the Patient Instructions below. Patient Instructions  Medication Instructions:  No changes *If you need a refill on your cardiac medications before your next appointment, please call your pharmacy*   Lab Work: None ordered If you have labs (blood work) drawn today and your tests are completely normal, you will receive your results only by: Marland Kitchen MyChart Message (if you have MyChart) OR . A paper copy in the mail If you have any lab test that is abnormal or we need to change your treatment, we will call you to review the results.   Testing/Procedures: None ordered   Follow-Up: At [facility], you and your health needs are our priority.  As part of our continuing mission to provide you with exceptional heart care, we have created designated Provider Care Teams.  These Care Teams include your primary Cardiologist (physician) and Advanced Practice Providers (APPs -  Physician Assistants and Nurse Practitioners)  who all work together to provide you with the care you need, when you need it.  We recommend signing up for the patient portal called "MyChart".  Sign up information is provided  on this After Visit Summary.  MyChart is used to connect with patients for Virtual Visits (Telemedicine).  Patients are able to view lab/test results, encounter notes, upcoming appointments, etc.  Non-urgent messages can be sent to your provider as well.   To learn more about what you can do with MyChart, go to [email or web address].    Your next appointment:   12 month(s)  The format for your next appointment:   In Person  Provider:   You may see [name], MD or one of the following Advanced Practice Providers on your designated Care Team:    Almyra Deforest, PA-C  Fabian Sharp, [place] or   Roby Lofts, PA-C         Signed, [name], MD  07/27/2020 9:35 AM    [address], [place],   [address] Phone: [phone]; Fax: [phone]

## 2020-08-13 ENCOUNTER — Other Ambulatory Visit: Payer: Self-pay | Admitting: Cardiovascular Disease

## 2020-08-15 DIAGNOSIS — M545 Low back pain: Secondary | ICD-10-CM | POA: Diagnosis not present

## 2020-08-15 DIAGNOSIS — L299 Pruritus, unspecified: Secondary | ICD-10-CM | POA: Diagnosis not present

## 2020-08-15 DIAGNOSIS — I10 Essential (primary) hypertension: Secondary | ICD-10-CM | POA: Diagnosis not present

## 2020-08-15 DIAGNOSIS — Z6823 Body mass index (BMI) 23.0-23.9, adult: Secondary | ICD-10-CM | POA: Diagnosis not present

## 2020-08-15 DIAGNOSIS — M10072 Idiopathic gout, left ankle and foot: Secondary | ICD-10-CM | POA: Diagnosis not present

## 2020-08-15 DIAGNOSIS — L821 Other seborrheic keratosis: Secondary | ICD-10-CM | POA: Diagnosis not present

## 2020-08-15 DIAGNOSIS — Z9889 Other specified postprocedural states: Secondary | ICD-10-CM | POA: Diagnosis not present

## 2020-08-15 DIAGNOSIS — Z0189 Encounter for other specified special examinations: Secondary | ICD-10-CM | POA: Diagnosis not present

## 2020-08-15 DIAGNOSIS — E1165 Type 2 diabetes mellitus with hyperglycemia: Secondary | ICD-10-CM | POA: Diagnosis not present

## 2020-08-15 DIAGNOSIS — R229 Localized swelling, mass and lump, unspecified: Secondary | ICD-10-CM | POA: Diagnosis not present

## 2020-09-06 ENCOUNTER — Other Ambulatory Visit: Payer: Self-pay

## 2020-09-06 ENCOUNTER — Ambulatory Visit (INDEPENDENT_AMBULATORY_CARE_PROVIDER_SITE_OTHER): Payer: PPO | Admitting: *Deleted

## 2020-09-06 DIAGNOSIS — I4891 Unspecified atrial fibrillation: Secondary | ICD-10-CM

## 2020-09-06 DIAGNOSIS — Z5181 Encounter for therapeutic drug level monitoring: Secondary | ICD-10-CM | POA: Diagnosis not present

## 2020-09-06 DIAGNOSIS — I4892 Unspecified atrial flutter: Secondary | ICD-10-CM

## 2020-09-06 LAB — POCT INR: INR: 2.5 (ref 2.0–3.0)

## 2020-09-06 NOTE — Patient Instructions (Signed)
Continue warfarin 1 1/2 tablets daily except 1 tablet on Tuesdays and Fridays °Recheck in 6 weeks °

## 2020-09-17 ENCOUNTER — Other Ambulatory Visit: Payer: Self-pay | Admitting: Cardiology

## 2020-09-19 ENCOUNTER — Telehealth: Payer: Self-pay | Admitting: *Deleted

## 2020-09-19 DIAGNOSIS — Z9889 Other specified postprocedural states: Secondary | ICD-10-CM | POA: Diagnosis not present

## 2020-09-19 DIAGNOSIS — M10072 Idiopathic gout, left ankle and foot: Secondary | ICD-10-CM | POA: Diagnosis not present

## 2020-09-19 DIAGNOSIS — R229 Localized swelling, mass and lump, unspecified: Secondary | ICD-10-CM | POA: Diagnosis not present

## 2020-09-19 DIAGNOSIS — Z6823 Body mass index (BMI) 23.0-23.9, adult: Secondary | ICD-10-CM | POA: Diagnosis not present

## 2020-09-19 DIAGNOSIS — M545 Low back pain: Secondary | ICD-10-CM | POA: Diagnosis not present

## 2020-09-19 DIAGNOSIS — E1165 Type 2 diabetes mellitus with hyperglycemia: Secondary | ICD-10-CM | POA: Diagnosis not present

## 2020-09-19 DIAGNOSIS — I10 Essential (primary) hypertension: Secondary | ICD-10-CM | POA: Diagnosis not present

## 2020-09-19 MED ORDER — WARFARIN SODIUM 5 MG PO TABS: ORAL_TABLET | ORAL | 4 refills | Status: DC

## 2020-09-19 NOTE — Telephone Encounter (Signed)
Needing refill on warfarin (COUMADIN) 5 MG tablet [[id number]]   sent to [facility]

## 2020-10-03 DIAGNOSIS — M549 Dorsalgia, unspecified: Secondary | ICD-10-CM | POA: Diagnosis not present

## 2020-10-03 DIAGNOSIS — E782 Mixed hyperlipidemia: Secondary | ICD-10-CM | POA: Diagnosis not present

## 2020-10-03 DIAGNOSIS — Z9889 Other specified postprocedural states: Secondary | ICD-10-CM | POA: Diagnosis not present

## 2020-10-03 DIAGNOSIS — L299 Pruritus, unspecified: Secondary | ICD-10-CM | POA: Diagnosis not present

## 2020-10-03 DIAGNOSIS — Z6823 Body mass index (BMI) 23.0-23.9, adult: Secondary | ICD-10-CM | POA: Diagnosis not present

## 2020-10-03 DIAGNOSIS — M10072 Idiopathic gout, left ankle and foot: Secondary | ICD-10-CM | POA: Diagnosis not present

## 2020-10-03 DIAGNOSIS — E1165 Type 2 diabetes mellitus with hyperglycemia: Secondary | ICD-10-CM | POA: Diagnosis not present

## 2020-10-03 DIAGNOSIS — L821 Other seborrheic keratosis: Secondary | ICD-10-CM | POA: Diagnosis not present

## 2020-10-03 DIAGNOSIS — Z0189 Encounter for other specified special examinations: Secondary | ICD-10-CM | POA: Diagnosis not present

## 2020-10-03 DIAGNOSIS — I10 Essential (primary) hypertension: Secondary | ICD-10-CM | POA: Diagnosis not present

## 2020-10-03 DIAGNOSIS — R229 Localized swelling, mass and lump, unspecified: Secondary | ICD-10-CM | POA: Diagnosis not present

## 2020-10-18 ENCOUNTER — Ambulatory Visit (INDEPENDENT_AMBULATORY_CARE_PROVIDER_SITE_OTHER): Payer: PPO | Admitting: *Deleted

## 2020-10-18 DIAGNOSIS — Z5181 Encounter for therapeutic drug level monitoring: Secondary | ICD-10-CM | POA: Diagnosis not present

## 2020-10-18 DIAGNOSIS — I4892 Unspecified atrial flutter: Secondary | ICD-10-CM | POA: Diagnosis not present

## 2020-10-18 DIAGNOSIS — I4891 Unspecified atrial fibrillation: Secondary | ICD-10-CM | POA: Diagnosis not present

## 2020-10-18 LAB — POCT INR: INR: 2.2 (ref 2.0–3.0)

## 2020-10-18 NOTE — Patient Instructions (Signed)
Continue warfarin 1 1/2 tablets daily except 1 tablet on Tuesdays and Fridays °Recheck in 6 weeks °

## 2020-11-29 ENCOUNTER — Ambulatory Visit (INDEPENDENT_AMBULATORY_CARE_PROVIDER_SITE_OTHER): Payer: PPO | Admitting: *Deleted

## 2020-11-29 DIAGNOSIS — Z5181 Encounter for therapeutic drug level monitoring: Secondary | ICD-10-CM | POA: Diagnosis not present

## 2020-11-29 DIAGNOSIS — I4892 Unspecified atrial flutter: Secondary | ICD-10-CM

## 2020-11-29 DIAGNOSIS — I4891 Unspecified atrial fibrillation: Secondary | ICD-10-CM | POA: Diagnosis not present

## 2020-11-29 LAB — POCT INR: INR: 2.2 (ref 2.0–3.0)

## 2020-11-29 NOTE — Patient Instructions (Signed)
Continue warfarin 1 1/2 tablets daily except 1 tablet on Tuesdays and Fridays °Recheck in 6 weeks °

## 2020-12-18 ENCOUNTER — Other Ambulatory Visit: Payer: Self-pay | Admitting: Cardiovascular Disease

## 2020-12-18 DIAGNOSIS — H40013 Open angle with borderline findings, low risk, bilateral: Secondary | ICD-10-CM | POA: Diagnosis not present

## 2020-12-18 DIAGNOSIS — H33321 Round hole, right eye: Secondary | ICD-10-CM | POA: Diagnosis not present

## 2020-12-18 DIAGNOSIS — H0102B Squamous blepharitis left eye, upper and lower eyelids: Secondary | ICD-10-CM | POA: Diagnosis not present

## 2020-12-18 DIAGNOSIS — E119 Type 2 diabetes mellitus without complications: Secondary | ICD-10-CM | POA: Diagnosis not present

## 2020-12-18 DIAGNOSIS — H04123 Dry eye syndrome of bilateral lacrimal glands: Secondary | ICD-10-CM | POA: Diagnosis not present

## 2020-12-18 DIAGNOSIS — H02834 Dermatochalasis of left upper eyelid: Secondary | ICD-10-CM | POA: Diagnosis not present

## 2020-12-18 DIAGNOSIS — H5213 Myopia, bilateral: Secondary | ICD-10-CM | POA: Diagnosis not present

## 2020-12-18 DIAGNOSIS — H43821 Vitreomacular adhesion, right eye: Secondary | ICD-10-CM | POA: Diagnosis not present

## 2020-12-18 DIAGNOSIS — H524 Presbyopia: Secondary | ICD-10-CM | POA: Diagnosis not present

## 2020-12-18 DIAGNOSIS — H0102A Squamous blepharitis right eye, upper and lower eyelids: Secondary | ICD-10-CM | POA: Diagnosis not present

## 2020-12-18 DIAGNOSIS — H02831 Dermatochalasis of right upper eyelid: Secondary | ICD-10-CM | POA: Diagnosis not present

## 2020-12-18 DIAGNOSIS — H2513 Age-related nuclear cataract, bilateral: Secondary | ICD-10-CM | POA: Diagnosis not present

## 2020-12-18 NOTE — Telephone Encounter (Signed)
OK to refill. TY.  

## 2020-12-19 ENCOUNTER — Encounter (INDEPENDENT_AMBULATORY_CARE_PROVIDER_SITE_OTHER): Payer: Self-pay | Admitting: Ophthalmology

## 2020-12-19 ENCOUNTER — Ambulatory Visit (INDEPENDENT_AMBULATORY_CARE_PROVIDER_SITE_OTHER): Payer: PPO | Admitting: Ophthalmology

## 2020-12-19 ENCOUNTER — Other Ambulatory Visit: Payer: Self-pay

## 2020-12-19 DIAGNOSIS — H2513 Age-related nuclear cataract, bilateral: Secondary | ICD-10-CM

## 2020-12-19 DIAGNOSIS — E119 Type 2 diabetes mellitus without complications: Secondary | ICD-10-CM

## 2020-12-19 DIAGNOSIS — Q141 Congenital malformation of retina: Secondary | ICD-10-CM

## 2020-12-19 DIAGNOSIS — H3589 Other specified retinal disorders: Secondary | ICD-10-CM | POA: Diagnosis not present

## 2020-12-19 DIAGNOSIS — H3581 Retinal edema: Secondary | ICD-10-CM

## 2020-12-19 DIAGNOSIS — H35033 Hypertensive retinopathy, bilateral: Secondary | ICD-10-CM

## 2020-12-19 DIAGNOSIS — H43823 Vitreomacular adhesion, bilateral: Secondary | ICD-10-CM

## 2020-12-19 DIAGNOSIS — I10 Essential (primary) hypertension: Secondary | ICD-10-CM | POA: Diagnosis not present
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
## 2021-01-02 DIAGNOSIS — R229 Localized swelling, mass and lump, unspecified: Secondary | ICD-10-CM | POA: Diagnosis not present

## 2021-01-02 DIAGNOSIS — Z6823 Body mass index (BMI) 23.0-23.9, adult: Secondary | ICD-10-CM | POA: Diagnosis not present

## 2021-01-02 DIAGNOSIS — E782 Mixed hyperlipidemia: Secondary | ICD-10-CM | POA: Diagnosis not present

## 2021-01-02 DIAGNOSIS — E1165 Type 2 diabetes mellitus with hyperglycemia: Secondary | ICD-10-CM | POA: Diagnosis not present

## 2021-01-02 DIAGNOSIS — L821 Other seborrheic keratosis: Secondary | ICD-10-CM | POA: Diagnosis not present

## 2021-01-02 DIAGNOSIS — M10072 Idiopathic gout, left ankle and foot: Secondary | ICD-10-CM | POA: Diagnosis not present

## 2021-01-02 DIAGNOSIS — I10 Essential (primary) hypertension: Secondary | ICD-10-CM | POA: Diagnosis not present

## 2021-01-02 DIAGNOSIS — Z9889 Other specified postprocedural states: Secondary | ICD-10-CM | POA: Diagnosis not present

## 2021-01-08 ENCOUNTER — Other Ambulatory Visit: Payer: Self-pay | Admitting: Cardiology

## 2021-01-10 ENCOUNTER — Ambulatory Visit (INDEPENDENT_AMBULATORY_CARE_PROVIDER_SITE_OTHER): Payer: PPO | Admitting: *Deleted

## 2021-01-10 DIAGNOSIS — I4892 Unspecified atrial flutter: Secondary | ICD-10-CM

## 2021-01-10 DIAGNOSIS — Z5181 Encounter for therapeutic drug level monitoring: Secondary | ICD-10-CM | POA: Diagnosis not present

## 2021-01-10 DIAGNOSIS — I4891 Unspecified atrial fibrillation: Secondary | ICD-10-CM | POA: Diagnosis not present

## 2021-01-10 LAB — POCT INR: INR: 2.3 (ref 2.0–3.0)

## 2021-01-10 NOTE — Patient Instructions (Signed)
Continue warfarin 1 1/2 tablets daily except 1 tablet on Tuesdays and Fridays °Recheck in 6 weeks °

## 2021-01-15 DIAGNOSIS — E1165 Type 2 diabetes mellitus with hyperglycemia: Secondary | ICD-10-CM | POA: Diagnosis not present

## 2021-01-15 DIAGNOSIS — M10072 Idiopathic gout, left ankle and foot: Secondary | ICD-10-CM | POA: Diagnosis not present

## 2021-01-15 DIAGNOSIS — Z6823 Body mass index (BMI) 23.0-23.9, adult: Secondary | ICD-10-CM | POA: Diagnosis not present

## 2021-01-15 DIAGNOSIS — R229 Localized swelling, mass and lump, unspecified: Secondary | ICD-10-CM | POA: Diagnosis not present

## 2021-01-15 DIAGNOSIS — M549 Dorsalgia, unspecified: Secondary | ICD-10-CM | POA: Diagnosis not present

## 2021-01-15 DIAGNOSIS — E782 Mixed hyperlipidemia: Secondary | ICD-10-CM | POA: Diagnosis not present

## 2021-01-15 DIAGNOSIS — Z9889 Other specified postprocedural states: Secondary | ICD-10-CM | POA: Diagnosis not present

## 2021-01-15 DIAGNOSIS — L299 Pruritus, unspecified: Secondary | ICD-10-CM | POA: Diagnosis not present

## 2021-01-15 DIAGNOSIS — I10 Essential (primary) hypertension: Secondary | ICD-10-CM | POA: Diagnosis not present

## 2021-01-15 DIAGNOSIS — L821 Other seborrheic keratosis: Secondary | ICD-10-CM | POA: Diagnosis not present

## 2021-02-20 ENCOUNTER — Ambulatory Visit (INDEPENDENT_AMBULATORY_CARE_PROVIDER_SITE_OTHER): Payer: PPO | Admitting: *Deleted

## 2021-02-20 DIAGNOSIS — Z5181 Encounter for therapeutic drug level monitoring: Secondary | ICD-10-CM | POA: Diagnosis not present

## 2021-02-20 DIAGNOSIS — I4891 Unspecified atrial fibrillation: Secondary | ICD-10-CM

## 2021-02-20 DIAGNOSIS — I4892 Unspecified atrial flutter: Secondary | ICD-10-CM

## 2021-02-20 LAB — POCT INR: INR: 2.5 (ref 2.0–3.0)

## 2021-02-20 NOTE — Patient Instructions (Signed)
Continue warfarin 1 1/2 tablets daily except 1 tablet on Tuesdays and Fridays Recheck in 6 weeks

## 2021-04-03 ENCOUNTER — Other Ambulatory Visit: Payer: Self-pay

## 2021-04-03 ENCOUNTER — Ambulatory Visit (INDEPENDENT_AMBULATORY_CARE_PROVIDER_SITE_OTHER): Payer: PPO | Admitting: *Deleted

## 2021-04-03 DIAGNOSIS — I4892 Unspecified atrial flutter: Secondary | ICD-10-CM | POA: Diagnosis not present

## 2021-04-03 DIAGNOSIS — Z5181 Encounter for therapeutic drug level monitoring: Secondary | ICD-10-CM | POA: Diagnosis not present

## 2021-04-03 DIAGNOSIS — I4891 Unspecified atrial fibrillation: Secondary | ICD-10-CM

## 2021-04-03 LAB — POCT INR: INR: 1.6 — AB (ref 2.0–3.0)

## 2021-04-03 NOTE — Patient Instructions (Signed)
Take warfarin 2 1/2 tablets tonight, take 2 tablets tomorrow night then resume 1 1/2 tablets daily except 1 tablet on Tuesdays and Fridays Recheck in 3 weeks

## 2021-04-24 ENCOUNTER — Ambulatory Visit (INDEPENDENT_AMBULATORY_CARE_PROVIDER_SITE_OTHER): Payer: PPO | Admitting: *Deleted

## 2021-04-24 DIAGNOSIS — I4892 Unspecified atrial flutter: Secondary | ICD-10-CM | POA: Diagnosis not present

## 2021-04-24 DIAGNOSIS — I484 Atypical atrial flutter: Secondary | ICD-10-CM

## 2021-04-24 DIAGNOSIS — Z5181 Encounter for therapeutic drug level monitoring: Secondary | ICD-10-CM | POA: Diagnosis not present

## 2021-04-24 DIAGNOSIS — I4891 Unspecified atrial fibrillation: Secondary | ICD-10-CM

## 2021-04-24 LAB — POCT INR: INR: 1.8 — AB (ref 2.0–3.0)

## 2021-04-24 MED ORDER — WARFARIN SODIUM 5 MG PO TABS: ORAL_TABLET | ORAL | 4 refills | Status: DC

## 2021-04-24 NOTE — Patient Instructions (Signed)
Take warfarin 2 tablets today the increase dose to 1 1/2 tablets daily Recheck in 3 weeks

## 2021-04-30 ENCOUNTER — Emergency Department (HOSPITAL_COMMUNITY): Payer: PPO

## 2021-04-30 ENCOUNTER — Encounter (HOSPITAL_COMMUNITY): Payer: Self-pay | Admitting: Emergency Medicine

## 2021-04-30 ENCOUNTER — Other Ambulatory Visit: Payer: Self-pay

## 2021-04-30 ENCOUNTER — Emergency Department (HOSPITAL_COMMUNITY)
Admission: EM | Admit: 2021-04-30 | Discharge: 2021-04-30 | Disposition: A | Discharge status: Home / Self Care | Payer: PPO | Attending: Emergency Medicine | Admitting: Emergency Medicine

## 2021-04-30 DIAGNOSIS — E119 Type 2 diabetes mellitus without complications: Secondary | ICD-10-CM | POA: Diagnosis not present

## 2021-04-30 DIAGNOSIS — I2510 Atherosclerotic heart disease of native coronary artery without angina pectoris: Secondary | ICD-10-CM | POA: Diagnosis not present

## 2021-04-30 DIAGNOSIS — R202 Paresthesia of skin: Secondary | ICD-10-CM | POA: Insufficient documentation

## 2021-04-30 DIAGNOSIS — Z87891 Personal history of nicotine dependence: Secondary | ICD-10-CM | POA: Insufficient documentation

## 2021-04-30 DIAGNOSIS — I10 Essential (primary) hypertension: Secondary | ICD-10-CM | POA: Insufficient documentation

## 2021-04-30 DIAGNOSIS — I672 Cerebral atherosclerosis: Secondary | ICD-10-CM | POA: Diagnosis not present

## 2021-04-30 DIAGNOSIS — R200 Anesthesia of skin: Secondary | ICD-10-CM | POA: Insufficient documentation

## 2021-04-30 DIAGNOSIS — Z951 Presence of aortocoronary bypass graft: Secondary | ICD-10-CM | POA: Insufficient documentation

## 2021-04-30 LAB — COMPREHENSIVE METABOLIC PANEL
ALT: 18 U/L (ref 0–44)
AST: 15 U/L (ref 15–41)
Albumin: 4.9 g/dL (ref 3.5–5.0)
Alkaline Phosphatase: 52 U/L (ref 38–126)
Anion gap: 9 (ref 5–15)
BUN: 25 mg/dL — ABNORMAL HIGH (ref 8–23)
CO2: 30 mmol/L (ref 22–32)
Calcium: 9.4 mg/dL (ref 8.9–10.3)
Chloride: 100 mmol/L (ref 98–111)
Creatinine, Ser: 1 mg/dL (ref 0.61–1.24)
GFR, Estimated: >60 mL/min (ref >60)
Glucose, Bld: 141 mg/dL — ABNORMAL HIGH (ref 70–99)
Potassium: 3.7 mmol/L (ref 3.5–5.1)
Sodium: 139 mmol/L (ref 135–145)
Total Bilirubin: 0.6 mg/dL (ref 0.3–1.2)
Total Protein: 7.9 g/dL (ref 6.5–8.1)

## 2021-04-30 LAB — DIFFERENTIAL
Abs Immature Granulocytes: 0.01 10*3/uL (ref 0.00–0.07)
Basophils Absolute: 0 10*3/uL (ref 0.0–0.1)
Basophils Relative: 1 %
Eosinophils Absolute: 0.1 10*3/uL (ref 0.0–0.5)
Eosinophils Relative: 3 %
Immature Granulocytes: 0 %
Lymphocytes Relative: 38 %
Lymphs Abs: 1.7 10*3/uL (ref 0.7–4.0)
Monocytes Absolute: 0.4 10*3/uL (ref 0.1–1.0)
Monocytes Relative: 9 %
Neutro Abs: 2.2 10*3/uL (ref 1.7–7.7)
Neutrophils Relative %: 49 %

## 2021-04-30 LAB — URINALYSIS, ROUTINE W REFLEX MICROSCOPIC
Bilirubin Urine: NEGATIVE
Glucose, UA: NEGATIVE mg/dL
Hgb urine dipstick: NEGATIVE
Ketones, ur: NEGATIVE mg/dL
Leukocytes,Ua: NEGATIVE
Nitrite: NEGATIVE
Protein, ur: NEGATIVE mg/dL
Specific Gravity, Urine: 1.005 (ref 1.005–1.030)
pH: 6 (ref 5.0–8.0)

## 2021-04-30 LAB — RAPID URINE DRUG SCREEN, HOSP PERFORMED
Amphetamines: NOT DETECTED
Barbiturates: NOT DETECTED
Benzodiazepines: NOT DETECTED
Cocaine: NOT DETECTED
Opiates: NOT DETECTED
Tetrahydrocannabinol: NOT DETECTED

## 2021-04-30 LAB — CBC
HCT: 43.3 % (ref 39.0–52.0)
Hemoglobin: 14.8 g/dL (ref 13.0–17.0)
MCH: 29.6 pg (ref 26.0–34.0)
MCHC: 34.2 g/dL (ref 30.0–36.0)
MCV: 86.6 fL (ref 80.0–100.0)
Platelets: 184 10*3/uL (ref 150–400)
RBC: 5 MIL/uL (ref 4.22–5.81)
RDW: 13.7 % (ref 11.5–15.5)
WBC: 4.4 10*3/uL (ref 4.0–10.5)
nRBC: 0 % (ref 0.0–0.2)

## 2021-04-30 LAB — APTT: aPTT: 32 seconds (ref 24–36)

## 2021-04-30 LAB — PROTIME-INR
INR: 1.6 — ABNORMAL HIGH (ref 0.8–1.2)
Prothrombin Time: 19 seconds — ABNORMAL HIGH (ref 11.4–15.2)

## 2021-04-30 LAB — ETHANOL: Alcohol, Ethyl (B): <10 mg/dL (ref <10)

## 2021-04-30 NOTE — ED Provider Notes (Signed)
Pt signed out by Dr. Sedonia Small pending work up.  Labs unremarkable.  CT with nothing acute.  MRI with nothing acute.  Pt is able to walk and talk and has no motor deficits.  Suspect paresthesias.  He is stable for d/c.  Return if worse.  F/u with pcp.   Isla Pence, MD 04/30/21 1038

## 2021-04-30 NOTE — ED Notes (Signed)
Pt placed on cardiac monitor with BP to set cycle every 30 minutes. Continuous pulse oximeter applied.  

## 2021-04-30 NOTE — ED Notes (Signed)
Patient transported to CT 

## 2021-04-30 NOTE — ED Provider Notes (Signed)
[facility] Emergency Department Provider Note MRN:  [id number]  Arrival date & time: 04/30/21     Chief Complaint   Numbness   History of Present Illness   Jacob [name] is a 70 y.o. year-old male with a history of A. fib, CAD, diabetes presenting to the ED with chief complaint of numbness.  Patient went to bed last night at 9:30 PM feeling normal.  Woke up this morning and noticed numbness to right arm and right leg, also noting some numbness sensation to the lips bilaterally.  Denies any weakness.  Felt a bit lightheaded when getting up this morning but that quickly resolved.  Denies any headache or vision change, no trouble speaking, no trouble swallowing, no chest pain or shortness of breath, no abdominal pain.  Numbness symptoms are constant, mild, no exacerbating or alleviating factors  Review of Systems  A complete 10 system review of systems was obtained and all systems are negative except as noted in the HPI and PMH.   Patient's Health History    Past Medical History:  Diagnosis Date  . A-fib ([place])   . CAD (coronary artery disease)   . Dyslipidemia   . Essential (primary) hypertension   . Gout   . Hyperlipidemia, unspecified   . Hypertension   . 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 Other fatigue   . Other specified soft tissue disorders   . Rheumatic mitral valve disease, unspecified   . 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 Type 2 diabetes mellitus without complications ([place])   . Unspecified atrial fibrillation [facility])     Past Surgical History:  Procedure Laterality Date  . CARDIAC CATHETERIZATION  12/03/04   sign. one vessel disease,severe MR  . CARDIAC CATHETERIZATION  01/19/06   normal  . CARDIAC CATHETERIZATION  06/06/10   mild nonobstructive CAD  . cyst  1980   removed from wrist  . MITRAL VALVE SURGERY  12/11/04   MV repair with Oletta Lamas ring,closure of PFO  .  RADIOFREQUENCY ABLATION  12/02/04   Dr. Emelda Brothers  . SALIVARY GLAND SURGERY  2004   growth  . TONSILLECTOMY  1959    Family History  Problem Relation Age of Onset  . Cancer Mother        non-hodgkins lymphoma  . Glaucoma Mother     Social History   Socioeconomic History  . Marital status: Married    Spouse name: Not on file  . Number of children: Not on file  . Years of education: Not on file  . Highest education level: Not on file  Occupational History  . Not on file  Tobacco Use  . Smoking status: Former Research scientist (life sciences)  . Smokeless tobacco: Never Used  Vaping Use  . Vaping Use: Never used  Substance and Sexual Activity  . Alcohol use: No  . Drug use: No  . Sexual activity: Not on file  Other Topics Concern  . Not on file  Social History Narrative  . Not on file   Social Determinants of Health   Financial Resource Strain: Not on file  Food Insecurity: Not on file  Transportation Needs: Not on file  Physical Activity: Not on file  Stress: Not on file  Social Connections: Not on file  Intimate Partner Violence: Not on file     Physical Exam   Vitals:   04/30/21 0647  BP: (!) 175/99  Pulse: 80  Resp: 16  Temp: 97.6 F (36.4 C)  SpO2: 100%    CONSTITUTIONAL: Well-appearing, NAD NEURO:  Alert and oriented x 3, normal and symmetric strength and sensation, normal coordination, normal speech, no aphasia, no neglect EYES:  eyes equal and reactive, no visual field cuts ENT/NECK:  no LAD, no JVD CARDIO: Regular rate, well-perfused, normal S1 and S2 PULM:  CTAB no wheezing or rhonchi GI/GU:  normal bowel sounds, non-distended, non-tender MSK/SPINE:  No gross deformities, no edema SKIN:  no rash, atraumatic PSYCH:  Appropriate speech and behavior  *Additional and/or pertinent findings included in MDM below  Diagnostic and Interventional Summary    EKG Interpretation  Date/Time:  Tuesday Apr 30 2021 06:45:26 EDT Ventricular Rate:  79 PR Interval:  150 QRS  Duration: 93 QT Interval:  397 QTC Calculation: 456 R Axis:   74 Text Interpretation: Sinus rhythm Multiple ventricular premature complexes Low voltage, precordial leads Confirmed by Gerlene Fee [phone]) on 04/30/2021 6:54:30 AM      Labs Reviewed  ETHANOL  PROTIME-INR  APTT  CBC  DIFFERENTIAL  COMPREHENSIVE METABOLIC PANEL  RAPID URINE DRUG SCREEN, HOSP PERFORMED  URINALYSIS, ROUTINE W REFLEX MICROSCOPIC  I-STAT CHEM 8, ED    CT HEAD WO CONTRAST    (Results Pending)    Medications - No data to display   Procedures  /  Critical Care Procedures  ED Course and Medical Decision Making  I have reviewed the triage vital signs, the nursing notes, and pertinent available records from the EMR.  Listed above are laboratory and imaging tests that I personally ordered, reviewed, and interpreted and then considered in my medical decision making (see below for details).  Right-sided numbness endorsed by patient involving face, arm, leg.  Of course this would raise concern for stroke last known normal 9:30 PM last night, Van negative, and therefore not a tPA candidate, no indication for code stroke initiation.  Patient's exam is actually completely normal, he denies any sensory differences when palpated.  NIH 0.  Therefore would qualify this is more of a paresthesia, but given risk factors and unilateral nature, will still need stroke work-up.  Awaiting labs, CT head, would follow-up with MRI if unremarkable.  Signed out to oncoming provider at shift change.       Barth Kirks. Sedonia Small, [place] [email or web address]  Final Clinical Impressions(s) / ED Diagnoses     ICD-10-CM   1. Paresthesia  R20.2     ED Discharge Orders    None       Discharge Instructions Discussed with and Provided to Patient:   Discharge Instructions   None       Maudie Flakes, MD 04/30/21 [phone]

## 2021-04-30 NOTE — ED Triage Notes (Signed)
Pt states he went to bed last night around 2130 and was normal and woke to right forearm and right knee numbness.

## 2021-05-15 ENCOUNTER — Other Ambulatory Visit: Payer: Self-pay

## 2021-05-15 ENCOUNTER — Ambulatory Visit (INDEPENDENT_AMBULATORY_CARE_PROVIDER_SITE_OTHER): Payer: PPO | Admitting: *Deleted

## 2021-05-15 DIAGNOSIS — Z5181 Encounter for therapeutic drug level monitoring: Secondary | ICD-10-CM

## 2021-05-15 DIAGNOSIS — I4891 Unspecified atrial fibrillation: Secondary | ICD-10-CM

## 2021-05-15 DIAGNOSIS — I4892 Unspecified atrial flutter: Secondary | ICD-10-CM

## 2021-05-15 LAB — POCT INR: INR: 1.6 — AB (ref 2.0–3.0)

## 2021-05-15 NOTE — Patient Instructions (Signed)
Take warfarin 2 tablets today the increase dose to 2 tablets daily except 1 1/2 tablets on Mondays, Wednesdays and Fridays Recheck in 2 weeks

## 2021-05-29 ENCOUNTER — Ambulatory Visit (INDEPENDENT_AMBULATORY_CARE_PROVIDER_SITE_OTHER): Payer: PPO | Admitting: *Deleted

## 2021-05-29 DIAGNOSIS — I4891 Unspecified atrial fibrillation: Secondary | ICD-10-CM | POA: Diagnosis not present

## 2021-05-29 DIAGNOSIS — Z5181 Encounter for therapeutic drug level monitoring: Secondary | ICD-10-CM

## 2021-05-29 DIAGNOSIS — I4892 Unspecified atrial flutter: Secondary | ICD-10-CM | POA: Diagnosis not present

## 2021-05-29 LAB — POCT INR: INR: 4.1 — AB (ref 2.0–3.0)

## 2021-05-29 NOTE — Patient Instructions (Signed)
Hold warfarin tonight then decrease dose to 1 1/2 tablets daily except 2 tablets on Tuesdays Recheck in 2 weeks

## 2021-06-04 ENCOUNTER — Ambulatory Visit
Admission: EM | Admit: 2021-06-04 | Discharge: 2021-06-04 | Disposition: A | Discharge status: Home / Self Care | Payer: PPO | Attending: Family Medicine | Admitting: Family Medicine

## 2021-06-04 ENCOUNTER — Ambulatory Visit (INDEPENDENT_AMBULATORY_CARE_PROVIDER_SITE_OTHER): Payer: PPO

## 2021-06-04 ENCOUNTER — Encounter: Payer: Self-pay | Admitting: Emergency Medicine

## 2021-06-04 DIAGNOSIS — R319 Hematuria, unspecified: Secondary | ICD-10-CM | POA: Insufficient documentation

## 2021-06-04 DIAGNOSIS — R1030 Lower abdominal pain, unspecified: Secondary | ICD-10-CM | POA: Diagnosis not present

## 2021-06-04 DIAGNOSIS — R109 Unspecified abdominal pain: Secondary | ICD-10-CM | POA: Diagnosis not present

## 2021-06-04 DIAGNOSIS — R1032 Left lower quadrant pain: Secondary | ICD-10-CM | POA: Insufficient documentation

## 2021-06-04 LAB — POCT URINALYSIS DIP (MANUAL ENTRY)
Bilirubin, UA: NEGATIVE
Glucose, UA: >=1000 mg/dL — AB
Leukocytes, UA: NEGATIVE
Nitrite, UA: NEGATIVE
Protein Ur, POC: NEGATIVE mg/dL
Spec Grav, UA: >=1.03 — AB (ref 1.010–1.025)
Urobilinogen, UA: 0.2 E.U./dL
pH, UA: 5 (ref 5.0–8.0)

## 2021-06-04 NOTE — Discharge Instructions (Addendum)

## 2021-06-04 NOTE — ED Triage Notes (Signed)
LLQ pain since 0300 today, reports feeling like he to urinate frequently but not urinating a lot.  Pt has had diarrhea the last 3 days but suffers with ibs.

## 2021-06-05 DIAGNOSIS — R109 Unspecified abdominal pain: Secondary | ICD-10-CM | POA: Diagnosis not present

## 2021-06-05 DIAGNOSIS — N200 Calculus of kidney: Secondary | ICD-10-CM | POA: Diagnosis not present

## 2021-06-05 NOTE — ED Provider Notes (Signed)
[place]   [id number] 06/04/21 Arrival Time: [place]:  1. Abdominal pain, left lower quadrant   2. Hematuria, unspecified type    I have personally viewed the imaging studies ordered this visit. Abd 2 view: No evidence of SBO.  Benign abdominal exam. No indications for urgent abdominal/pelvic imaging at this time. Discussed possibility of kidney stone but symptoms not classic. Also discussed possibility of diverticulitis. Declines ED eval this evening for CT scan. Prefers home observation and f/u with PCP. Tolerating PO intake without difficulty.  Labs Reviewed  POCT URINALYSIS DIP (MANUAL ENTRY) - Abnormal; Notable for the following components:      Result Value   Glucose, UA >=1,000 (*)    Ketones, POC UA trace (5) (*)    Spec Grav, UA >=1.030 (*)    Blood, UA large (*)    All other components within normal limits  URINE CULTURE      Discharge Instructions      You have been seen today for abdominal pain. Your evaluation was not suggestive of any emergent condition requiring medical intervention at this time. However, some abdominal problems make take more time to appear. Therefore, it is very important for you to pay attention to any new symptoms or worsening of your current condition.  Please return here or to the Emergency Department immediately should you begin to feel worse in any way or have any of the following symptoms: increasing or different abdominal pain, persistent vomiting, inability to drink fluids, fevers, or shaking chills.       Follow-up Information     Schedule an appointment as soon as possible for a visit  with Celene Squibb, MD.   Specialty: Internal Medicine Contact information: [address] [place] [address] [phone]         [place].   Specialty: Emergency Medicine Why: If symptoms worsen in any way. Contact information: [address] [id number] [place] [phone]                Reviewed expectations re: course of current medical issues. Questions answered. Outlined signs and symptoms indicating need for more acute intervention. Patient verbalized understanding. After Visit Summary given.   SUBJECTIVE: History from: patient. Jacob [name] is a 70 y.o. male who presents with complaint of intermittent LLQ abdominal discomfort. Onset gradual,  first noted 0300 today . Discomfort described as dull. Reports normal flatus. Symptoms are stable since beginning. Fever: absent. Aggravating factors: have not been identified. Alleviating factors: have not been identified. Associated symptoms: slight urinary urgency without dysuria. He denies belching, chills, fever, headache, and myalgias. Appetite: normal. PO intake: normal. Ambulatory without assistance. Bowel movements: loose stools recently but attributes this to IBS; no blood. No tx PTA.  Past Surgical History:  Procedure Laterality Date   CARDIAC CATHETERIZATION  12/03/04   sign. one vessel disease,severe MR   CARDIAC CATHETERIZATION  01/19/06   normal   CARDIAC CATHETERIZATION  06/06/10   mild nonobstructive CAD   cyst  1980   removed from wrist   MITRAL VALVE SURGERY  12/11/04   MV repair with Oletta Lamas ring,closure of PFO   RADIOFREQUENCY ABLATION  12/02/04   Dr. Emelda Brothers   SALIVARY GLAND SURGERY  2004   growth   TONSILLECTOMY  1959     OBJECTIVE:  Vitals:   06/04/21 1547  BP: (!) 165/78  Pulse: 63  Resp: 18  Temp:  97.8 F (36.6 C)  SpO2: 99%    General appearance: alert, oriented, no acute distress HEENT: [place]; AT; oropharynx moist Lungs: unlabored respirations Abdomen: soft; without distention; mild  and poorly localized tenderness to palpation over LLQ ; without masses or organomegaly; without guarding or rebound tenderness Back: without reported CVA tenderness; FROM at waist Extremities: without LE  edema; symmetrical; without gross deformities Skin: warm and dry Neurologic: normal gait Psychological: alert and cooperative; normal mood and affect  Labs: Results for orders placed or performed during the hospital encounter of 06/04/21  POCT urinalysis dipstick  Result Value Ref Range   Color, UA yellow yellow   Clarity, UA clear clear   Glucose, UA >=1,000 (A) negative mg/dL   Bilirubin, UA negative negative   Ketones, POC UA trace (5) (A) negative mg/dL   Spec Grav, UA >=1.030 (A) 1.010 - 1.025   Blood, UA large (A) negative   pH, UA 5.0 5.0 - 8.0   Protein Ur, POC negative negative mg/dL   Urobilinogen, UA 0.2 0.2 or 1.0 E.U./dL   Nitrite, UA Negative Negative   Leukocytes, UA Negative Negative   Labs Reviewed  POCT URINALYSIS DIP (MANUAL ENTRY) - Abnormal; Notable for the following components:      Result Value   Glucose, UA >=1,000 (*)    Ketones, POC UA trace (5) (*)    Spec Grav, UA >=1.030 (*)    Blood, UA large (*)    All other components within normal limits  URINE CULTURE    Imaging: DG Abd 2 Views  Result Date: 06/04/2021 CLINICAL DATA:  Lower abdominal pain EXAM: ABDOMEN - 2 VIEW COMPARISON:  None. FINDINGS: Spine and upright images obtained. Moderate stool in colon. No bowel dilatation or air-fluid level to suggest bowel obstruction. No free air. Lung bases clear. Probable phleboliths in the pelvis. Status post mitral valve replacement. IMPRESSION: No evident bowel obstruction or free air. Lung bases clear. Moderate stool in colon. Electronically Signed   By: Lowella Grip III M.D.   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,severe MR   Other fatigue    Other specified soft tissue disorders    Rheumatic mitral valve disease, unspecified    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   Edwards ring   Type 2 diabetes mellitus without complications ([place])    Unspecified atrial fibrillation ([facility])     Social History   Socioeconomic History   Marital status: Married    Spouse name: Not on file   Number of children: Not on file   Years of education: Not on file   Highest education level: Not  on file  Occupational History   Not on file  Tobacco Use   Smoking status: Former    Pack years: 0.00   Smokeless tobacco: Never  Vaping Use   Vaping Use: Never used  Substance and Sexual Activity   Alcohol use: No   Drug use: No   Sexual activity: Not on file  Other Topics Concern   Not on file  Social History Narrative   Not on file   Social Determinants of Health   Financial Resource Strain: Not on file  Food Insecurity: Not on file  Transportation Needs: Not on file  Physical Activity: Not on file  Stress: Not on file  Social Connections: Not on file  Intimate Partner Violence: Not on file    Family History  Problem Relation Age of Onset   Cancer Mother        non-hodgkins lymphoma   Glaucoma Mother      Vanessa Kick, MD 06/05/21 [phone]

## 2021-06-06 LAB — URINE CULTURE: Culture: NO GROWTH

## 2021-06-12 ENCOUNTER — Ambulatory Visit (INDEPENDENT_AMBULATORY_CARE_PROVIDER_SITE_OTHER): Payer: PPO | Admitting: *Deleted

## 2021-06-12 ENCOUNTER — Other Ambulatory Visit: Payer: Self-pay

## 2021-06-12 DIAGNOSIS — I4892 Unspecified atrial flutter: Secondary | ICD-10-CM

## 2021-06-12 DIAGNOSIS — Z5181 Encounter for therapeutic drug level monitoring: Secondary | ICD-10-CM

## 2021-06-12 DIAGNOSIS — I4891 Unspecified atrial fibrillation: Secondary | ICD-10-CM

## 2021-06-12 LAB — POCT INR: INR: 3.1 — AB (ref 2.0–3.0)

## 2021-06-12 NOTE — Patient Instructions (Signed)
Decrease warfarin to 1 1/2 tablets daily  Recheck in 3 weeks

## 2021-06-25 DIAGNOSIS — R109 Unspecified abdominal pain: Secondary | ICD-10-CM | POA: Diagnosis not present

## 2021-06-27 ENCOUNTER — Other Ambulatory Visit (HOSPITAL_COMMUNITY): Payer: Self-pay | Admitting: Student

## 2021-06-27 DIAGNOSIS — R109 Unspecified abdominal pain: Secondary | ICD-10-CM

## 2021-07-03 ENCOUNTER — Ambulatory Visit (INDEPENDENT_AMBULATORY_CARE_PROVIDER_SITE_OTHER): Payer: PPO | Admitting: *Deleted

## 2021-07-03 DIAGNOSIS — I4891 Unspecified atrial fibrillation: Secondary | ICD-10-CM

## 2021-07-03 DIAGNOSIS — I4892 Unspecified atrial flutter: Secondary | ICD-10-CM

## 2021-07-03 DIAGNOSIS — Z5181 Encounter for therapeutic drug level monitoring: Secondary | ICD-10-CM | POA: Diagnosis not present

## 2021-07-03 LAB — POCT INR: INR: 4.8 — AB (ref 2.0–3.0)

## 2021-07-03 NOTE — Patient Instructions (Signed)
Took warfarin this morning Hold warfarin Thursdays, take 1/2 tablet Friday then decrease dose to 1 1/2 tablet daily except 1 tablet on Sundays, Tuesdays and Thursdays Recheck in 2 weeks

## 2021-07-10 ENCOUNTER — Other Ambulatory Visit: Payer: Self-pay

## 2021-07-10 ENCOUNTER — Ambulatory Visit (HOSPITAL_COMMUNITY)
Admission: RE | Admit: 2021-07-10 | Discharge: 2021-07-10 | Disposition: A | Discharge status: Home / Self Care | Payer: PPO | Source: Ambulatory Visit | Attending: Student | Admitting: Student

## 2021-07-10 DIAGNOSIS — I10 Essential (primary) hypertension: Secondary | ICD-10-CM | POA: Diagnosis not present

## 2021-07-10 DIAGNOSIS — R109 Unspecified abdominal pain: Secondary | ICD-10-CM | POA: Diagnosis not present

## 2021-07-10 DIAGNOSIS — N400 Enlarged prostate without lower urinary tract symptoms: Secondary | ICD-10-CM | POA: Diagnosis not present

## 2021-07-10 DIAGNOSIS — E119 Type 2 diabetes mellitus without complications: Secondary | ICD-10-CM | POA: Diagnosis not present

## 2021-07-10 DIAGNOSIS — I4891 Unspecified atrial fibrillation: Secondary | ICD-10-CM | POA: Diagnosis not present

## 2021-07-11 DIAGNOSIS — E782 Mixed hyperlipidemia: Secondary | ICD-10-CM | POA: Diagnosis not present

## 2021-07-11 DIAGNOSIS — R972 Elevated prostate specific antigen [PSA]: Secondary | ICD-10-CM | POA: Diagnosis not present

## 2021-07-11 DIAGNOSIS — E559 Vitamin D deficiency, unspecified: Secondary | ICD-10-CM | POA: Diagnosis not present

## 2021-07-11 DIAGNOSIS — E1165 Type 2 diabetes mellitus with hyperglycemia: Secondary | ICD-10-CM | POA: Diagnosis not present

## 2021-07-17 ENCOUNTER — Other Ambulatory Visit: Payer: Self-pay

## 2021-07-17 ENCOUNTER — Ambulatory Visit (INDEPENDENT_AMBULATORY_CARE_PROVIDER_SITE_OTHER): Payer: PPO | Admitting: *Deleted

## 2021-07-17 DIAGNOSIS — M109 Gout, unspecified: Secondary | ICD-10-CM | POA: Diagnosis not present

## 2021-07-17 DIAGNOSIS — I4892 Unspecified atrial flutter: Secondary | ICD-10-CM | POA: Diagnosis not present

## 2021-07-17 DIAGNOSIS — E1165 Type 2 diabetes mellitus with hyperglycemia: Secondary | ICD-10-CM | POA: Diagnosis not present

## 2021-07-17 DIAGNOSIS — I10 Essential (primary) hypertension: Secondary | ICD-10-CM | POA: Diagnosis not present

## 2021-07-17 DIAGNOSIS — Z5181 Encounter for therapeutic drug level monitoring: Secondary | ICD-10-CM

## 2021-07-17 DIAGNOSIS — I4891 Unspecified atrial fibrillation: Secondary | ICD-10-CM | POA: Diagnosis not present

## 2021-07-17 DIAGNOSIS — E782 Mixed hyperlipidemia: Secondary | ICD-10-CM | POA: Diagnosis not present

## 2021-07-17 DIAGNOSIS — R911 Solitary pulmonary nodule: Secondary | ICD-10-CM | POA: Diagnosis not present

## 2021-07-17 DIAGNOSIS — R109 Unspecified abdominal pain: Secondary | ICD-10-CM | POA: Diagnosis not present

## 2021-07-17 DIAGNOSIS — Z0001 Encounter for general adult medical examination with abnormal findings: Secondary | ICD-10-CM | POA: Diagnosis not present

## 2021-07-17 LAB — POCT INR: INR: 2.6 (ref 2.0–3.0)

## 2021-07-17 NOTE — Patient Instructions (Signed)
Continue warfarin 1 1/2 tablet daily except 1 tablet on Sundays, Tuesdays and Thursdays Recheck in 3 weeks

## 2021-07-18 ENCOUNTER — Encounter: Payer: Self-pay | Admitting: Internal Medicine

## 2021-08-06 ENCOUNTER — Ambulatory Visit: Payer: PPO | Admitting: Cardiovascular Disease

## 2021-08-06 ENCOUNTER — Encounter: Payer: Self-pay | Admitting: Cardiovascular Disease

## 2021-08-06 ENCOUNTER — Other Ambulatory Visit: Payer: Self-pay

## 2021-08-06 VITALS — BP 118/82 | HR 64 | Ht 73.0 in | Wt 167.0 lb

## 2021-08-06 DIAGNOSIS — R5383 Other fatigue: Secondary | ICD-10-CM | POA: Diagnosis not present

## 2021-08-06 DIAGNOSIS — I484 Atypical atrial flutter: Secondary | ICD-10-CM

## 2021-08-06 DIAGNOSIS — I471 Supraventricular tachycardia: Secondary | ICD-10-CM

## 2021-08-06 DIAGNOSIS — I10 Essential (primary) hypertension: Secondary | ICD-10-CM

## 2021-08-06 DIAGNOSIS — Z9889 Other specified postprocedural states: Secondary | ICD-10-CM

## 2021-08-06 DIAGNOSIS — E119 Type 2 diabetes mellitus without complications: Secondary | ICD-10-CM

## 2021-08-06 DIAGNOSIS — E785 Hyperlipidemia, unspecified: Secondary | ICD-10-CM | POA: Diagnosis not present

## 2021-08-06 DIAGNOSIS — I2510 Atherosclerotic heart disease of native coronary artery without angina pectoris: Secondary | ICD-10-CM | POA: Diagnosis not present

## 2021-08-06 MED ORDER — ROSUVASTATIN CALCIUM 10 MG PO TABS: 10.0000 mg | ORAL_TABLET | Freq: Every day | ORAL | 3 refills | Status: DC

## 2021-08-06 NOTE — Progress Notes (Signed)
Cardiology Office Note    Date:  08/06/2021   ID:  [name], DOB 01-Apr-1951, MRN [id number]  PCP:  Celene Squibb, MD  Cardiologist:   [name], MD   Chief Complaint  Patient presents with   Irregular Heart Beat   Cardiac Valve Problem    History of Present Illness:  Jacob [name] is a 70 y.o. male who presents for follow up for atrial arrhythmia (atypical atrial flutter and atrial fibrillation) and history of MV repair (2005, 28 mm annuloplasty ring, [name]) with oversewing of the left atrial appendage.  He has a history of ablation for AV node reentry in 2005 as well.  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  His biggest complaint remains fatigue, which is present from the beginning of the day.  He has occasional palpitations, but these do not associate tenderness, syncope or shortness of breath or angina.  He denies lower extremity edema, orthopnea or PND.  He has shortness of breath with activity, but is able to climb a flight of stairs without difficulty.  He lost a lot of weight, which concerned his primary care provider Dr. [name], but he was able to reverse some of the weight loss.  BMI is 22.  He is a loud snorer and has some daytime hypersomnolence, but really not a complete picture to suggest obstructive sleep apnea.  May 2021 echocardiogram showed normal left ventricular systolic function and mild increase in gradients across the mitral annulus (mean gradient 5 mmHg, heart rate 80 bpm).  The nuclear stress test performed in May showed a normal pattern of perfusion except for small apical attenuation artifact.  He has not had serious falls or injuries or bleeding problems and is on warfarin anticoagulation.  He had some problems with left flank and left lower quadrant pain.  A CT of the abdomen did not identify an etiology, but there was a lot of delay between the symptoms and the CT.  This did show bilateral small  inguinal hernias with fat but no intestinal content and thickening of the wall duodenum concerning for duodenitis versus mass.  An upper endoscopy was recommended and he is scheduled to see [name] with [facility] gastroenterology on September 7.  The CT of the abdomen also showed substantial atherosclerosis of the abdominal aorta, without evidence of aneurysm.  Despite his significant weight loss and a BMI of only 22 his hemoglobin A1c has recently deteriorated, up to 7.0%.  He has chronic dyslipidemia with his HDL recently 33 and LDL of 122, borderline triglycerides 178.  He is on metformin monotherapy.  He has normal renal function and is not anemic (hemoglobin 13.4).  He has had some problems with depression.  He had stopped taking his beta-blocker since it caused depression and anxiety.  He felt better after stopping it.  He is intolerant of statins which have produced personality changes every time he has tried to take them.  He has not had muscular complaints.  In January 2016, he developed very rapid atrial tachycardia (scar related left atrial flutter?) at about 220 bpm during exercise testing. This is likely the cause of his intermittent palpitations. 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 He has been poorly tolerant to multiple beta blockers (metoprolol and carvedilol cause fatigue, nebivolol made him to "always be in a bad mood").  He has a history of previous mitral valve repair in  2005 for severe mitral insufficiency secondary to prolapse of the middle scallop of the posterior leaflet with placement of a Carpentier-Edwards 28 mm annuloplasty ring. At that time his left atrial appendage was oversewn and a patent foramen ovale was closed. Preoperative cardiac catheterization showed noncritical coronary atherosclerosis. Repeat cardiac catheterization in 2011 again showed minimal irregularities, the worst lesion being a 40% stenosis in the mid right  coronary artery. He has had a couple of normal nuclear stress tests, most recently in 2009 and normal treadmill stress test most recently January 2016.   He also has a history of hypertension and dyslipidemia and weight gain related diabetes mellitus type 2. He quit smoking in 1986.    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 He cannot wear an event monitor since he is allergic to the adhesive pads. He was intolerant to metoprolol and carvedilol (fatigue). Bystolic caused excessive irritability.  Most recent echo in June 2021 showed normal left ventricular systolic function, EF 60 to 65%, no residual mitral regurgitation and mild transmitral gradient of 5 mmHg at 80 bpm.  A nuclear stress test performed in May 2021 showed normal perfusion artifact.   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 one vessel disease,severe MR   CARDIAC CATHETERIZATION  01/19/06   normal   CARDIAC CATHETERIZATION  06/06/10   mild nonobstructive CAD   cyst  1980   removed from wrist   MITRAL VALVE SURGERY  12/11/04   MV repair with Oletta Lamas ring,closure of PFO   RADIOFREQUENCY ABLATION  12/02/04   Dr. Emelda Brothers   SALIVARY GLAND SURGERY  2004   growth   TONSILLECTOMY  1959    Outpatient Medications Prior to  Visit  Medication Sig Dispense Refill   acetaminophen (TYLENOL) 650 MG CR tablet Take 1,300 mg by mouth every 6 (six) hours as needed for pain.     allopurinol (ZYLOPRIM) 300 MG tablet TAKE 1 TABLET BY MOUTH ONCE A DAY. 90 tablet 0   cholecalciferol (VITAMIN D3) 25 MCG (1000 UNIT) tablet Take 1,000 Units by mouth daily.     lisinopril (ZESTRIL) 20 MG tablet Take 1 tablet (20 mg total) by mouth daily. 90 tablet 3   metFORMIN (GLUCOPHAGE) 500 MG tablet TAKE 1 TABLET BY MOUTH 3 TIMES DAILY. 270 tablet 3   torsemide (DEMADEX) 20 MG tablet Take 1-2 tablets (20-40 mg total) by mouth daily. 90 tablet 1   warfarin (COUMADIN) 5 MG tablet TAKE 1 AND 1/2 TABLET BY MOUTH ONCE DAILY OR AS DIRECTED 50 tablet 4   metFORMIN (GLUCOPHAGE-XR) 500 MG 24 hr tablet Take 500 mg by mouth 3 (three) times daily.     triamcinolone cream (KENALOG) 0.5 % Apply topically 2 (two) times daily.  No facility-administered medications prior to visit.     Allergies:   Atenolol, Cardizem [diltiazem hcl], Metoprolol, and Clindamycin/lincomycin   Social History   Socioeconomic History   Marital status: Married    Spouse name: Not on file   Number of children: Not on file   Years of education: Not on file   Highest education level: Not on file  Occupational History   Not on file  Tobacco Use   Smoking status: Former   Smokeless tobacco: Never  Vaping Use   Vaping Use: Never used  Substance and Sexual Activity   Alcohol use: No   Drug use: No   Sexual activity: Not on file  Other Topics Concern   Not on file  Social History Narrative   Not on file   Social Determinants of Health   Financial Resource Strain: Not on file  Food Insecurity: Not on file  Transportation Needs: Not on file  Physical Activity: Not on file  Stress: Not on file  Social Connections: Not on file     Family History:  The patient's family history includes Cancer in his mother; Glaucoma in his mother.   ROS:   Please see the history  of present illness.    ROS All other systems reviewed and are negative.   PHYSICAL EXAM:   VS:  BP 118/82   Pulse 64   Ht '6\' 1"'$  (1.854 m)   Wt 167 lb (75.8 kg)   SpO2 97%   BMI 22.03 kg/m      General: Alert, oriented x3, no distress, very lean Head: no evidence of trauma, PERRL, EOMI, no exophtalmos or lid lag, no myxedema, no xanthelasma; normal ears, nose and oropharynx Neck: normal jugular venous pulsations and no hepatojugular reflux; brisk carotid pulses without delay and no carotid bruits Chest: clear to auscultation, no signs of consolidation by percussion or palpation, normal fremitus, symmetrical and full respiratory excursions Cardiovascular: normal position and quality of the apical impulse, regular rhythm, normal first and second heart sounds, no murmurs, rubs or gallops Abdomen: no tenderness or distention, no masses by palpation, no abnormal pulsatility or arterial bruits, normal bowel sounds, no hepatosplenomegaly Extremities: no clubbing, cyanosis or edema; 2+ radial, ulnar and brachial pulses bilaterally; 2+ right femoral, posterior tibial and dorsalis pedis pulses; 2+ left femoral, posterior tibial and dorsalis pedis pulses; no subclavian or femoral bruits Neurological: grossly nonfocal Psych: Normal mood and affect    Wt Readings from Last 3 Encounters:  08/06/21 167 lb (75.8 kg)  04/30/21 165 lb (74.8 kg)  07/27/20 169 lb 12.8 oz (77 kg)      Studies/Labs Reviewed:   EKG: EKG is ordered today and shows sinus rhythm with a couple of PVCs and no repolarization abnormalities.  ECHO 05/24/2020   1. Left ventricular ejection fraction, by estimation, is 60 to 65%. The left ventricle has normal function. The left ventricle has no regional wall motion abnormalities. Left ventricular diastolic parameters are indeterminate.   2. Right ventricular systolic function is normal. The right ventricular size is normal. There is normal pulmonary artery systolic pressure.    3. 28 mm Edwards physio angioplasty ring MV repair is present. Mild mean gradient across the valve of 5 mmHg. . The mitral valve has been repaired/replaced. No evidence of mitral valve regurgitation.   4. The aortic valve has an indeterminant number of cusps. Aortic valve regurgitation is not visualized. No aortic stenosis is present.   5. The inferior vena cava is normal in  size with greater than 50% respiratory variability, suggesting right atrial pressure of 3 mmHg.   BMET    Component Value Date/Time   NA 139 04/30/2021 0651   NA 141 03/19/2020 0807   K 3.7 04/30/2021 0651   CL 100 04/30/2021 0651   CO2 30 04/30/2021 0651   GLUCOSE 141 (H) 04/30/2021 0651   BUN 25 (H) 04/30/2021 0651   BUN 21 03/19/2020 0807   CREATININE 1.00 04/30/2021 0651   CALCIUM 9.4 04/30/2021 0651   GFRNONAA >60 04/30/2021 0651   GFRAA 72 03/19/2020 0807     Lipid Panel     Component Value Date/Time   CHOL 156 03/19/2020 0807   TRIG 116 03/19/2020 0807   HDL 31 (L) 03/19/2020 0807   CHOLHDL 5.0 03/19/2020 0807   CHOLHDL 4.7 01/27/2019 0832   VLDL 15 01/27/2019 0832   LDLCALC 104 (H) 03/19/2020 0807   LABVLDL 21 03/19/2020 0807   07/12/2021  Cholesterol 178, HDL 33, LDL 122, triglycerides 128  Hemoglobin 13.4, creatinine 0.95, hemoglobin A1c 7.0%  ASSESSMENT:    1. Atypical atrial flutter ([place])   2. SVT (supraventricular tachycardia) ([place])   3. History of mitral valve repair   4. Essential hypertension   5. Diabetes mellitus type 2 in nonobese (HCC)   6. Coronary artery disease involving native coronary artery of native heart without angina pectoris   7. Dyslipidemia (high LDL; low HDL)   8. Other fatigue      PLAN:  In order of problems listed above:   Atypical atrial flutter: Intolerant of daily beta-blocker therapy (we have tried both metoprolol and bisoprolol) and he also had side effects with diltiazem. See treadmill stress test ECG tracings from 2017. Most likely has scar  reentry flutter in the left atrium.  We have never documented atrial fibrillation. In 2019, he was evaluated by Dr. Rayann Heman in the electrophysiology clinic and antiarrhythmics and ablation were discussed.  Mr. Rivett does not want to take antiarrhythmics and he felt the risks of ablation were not justified as his symptoms are not that severe. CHADSVasc 3 (Age, HTN, DM).  On warfarin. He did have his left atrial appendage oversewn; he should be at lower embolic risk. 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  Durable result.  No residual mitral insufficiency and mildly increased gradients. Left atrial appendage was oversewn and PFO close at the time of surgery. HTN: Very well controlled. DM: Borderline glycemic control despite very lean body habitus.  Encouraged him to be more physically active. CAD: He has never had angina pectoris.  He does have some history of coronary atherosclerosis by angiography.  Moderate stenosis of RCA (40%) at last cardiac catheterization.  Normal recent nuclear stress test.   HLP: Despite his very lean body habitus, he has a disadvantageous lipid profile with moderately elevated LDL, low HDL, consistent with metabolic syndrome/insulin resistance.  I again recommended treatment with a statin.  He agrees to try to take rosuvastatin.  Encouraged him to take over-the-counter coenzyme every 10 with this.  If he has evidence of coronary and aortic atherosclerosis we will shoot for an LDL less than 70 if we can achieve that without side effects.  He cannot afford PCSK9 inhibitors. Fatigue: He is not anemic.  He does not have other features to suggest hypothyroidism.  He does have some symptoms of depression and an incomplete  clinical picture suggestive of obstructive sleep apnea.  Consider outpatient sleep study.  He is very cost conscious and wants to avoid  unnecessary procedures and medications.  Medication Adjustments/Labs and Tests Ordered: Current medicines are reviewed at length with the patient today.  Concerns regarding medicines are outlined above.  Medication changes, Labs and Tests ordered today are listed in the Patient Instructions below. Patient Instructions  Medication Instructions:  Rosuvastatin 10 mg once daily  *If you need a refill on your cardiac medications before your next appointment, please call your pharmacy*   Lab Work: Your provider would like for you to return in 3 months to have the following labs drawn: fasting Lipid. You do not need an appointment for the lab. Once in our office lobby there is a podium where you can sign in and ring the doorbell to alert [place] that you are here. The lab is open from 8:00 am to 4:30 pm; closed for lunch from 12:45pm-1:45pm.   If you have labs (blood work) drawn today and your tests are completely normal, you will receive your results only by: [place] (if you have MyChart) OR A paper copy in the mail If you have any lab test that is abnormal or we need to change your treatment, we will call you to review the results.   Testing/Procedures: None ordered   Follow-Up: At [facility], you and your health needs are our priority.  As part of our continuing mission to provide you with exceptional heart care, we have created designated Provider Care Teams.  These Care Teams include your primary Cardiologist (physician) and Advanced Practice Providers (APPs -  Physician Assistants and Nurse Practitioners) who all work together to provide you with the care you need, when you need it.  We recommend signing up for the patient portal called "MyChart".  Sign up information is provided on this After Visit Summary.  MyChart is used to connect with patients for Virtual Visits (Telemedicine).  Patients are able to view lab/test results, encounter notes, upcoming appointments, etc.   Non-urgent messages can be sent to your provider as well.   To learn more about what you can do with MyChart, go to [email or web address].    Your next appointment:   12 month(s)  The format for your next appointment:   In Person  Provider:   You may see [name], MD or one of the following Advanced Practice Providers on your designated Care Team:   Almyra Deforest, PA-C Fabian Sharp, [place] or  Roby Lofts, PA-C      Signed, [name], MD  08/06/2021 7:05 PM    [address] [place], [place], [place]  [address] Phone: [phone]; Fax: [phone]

## 2021-08-06 NOTE — Patient Instructions (Signed)
Medication Instructions:  Rosuvastatin 10 mg once daily  *If you need a refill on your cardiac medications before your next appointment, please call your pharmacy*   Lab Work: Your provider would like for you to return in 3 months to have the following labs drawn: fasting Lipid. You do not need an appointment for the lab. Once in our office lobby there is a podium where you can sign in and ring the doorbell to alert [place] that you are here. The lab is open from 8:00 am to 4:30 pm; closed for lunch from 12:45pm-1:45pm.   If you have labs (blood work) drawn today and your tests are completely normal, you will receive your results only by: [place] (if you have MyChart) OR A paper copy in the mail If you have any lab test that is abnormal or we need to change your treatment, we will call you to review the results.   Testing/Procedures: None ordered   Follow-Up: At [facility], you and your health needs are our priority.  As part of our continuing mission to provide you with exceptional heart care, we have created designated Provider Care Teams.  These Care Teams include your primary Cardiologist (physician) and Advanced Practice Providers (APPs -  Physician Assistants and Nurse Practitioners) who all work together to provide you with the care you need, when you need it.  We recommend signing up for the patient portal called "MyChart".  Sign up information is provided on this After Visit Summary.  MyChart is used to connect with patients for Virtual Visits (Telemedicine).  Patients are able to view lab/test results, encounter notes, upcoming appointments, etc.  Non-urgent messages can be sent to your provider as well.   To learn more about what you can do with MyChart, go to [email or web address].    Your next appointment:   12 month(s)  The format for your next appointment:   In Person  Provider:   You may see [name], MD or one of the following Advanced Practice  Providers on your designated Care Team:   Almyra Deforest, PA-C Fabian Sharp, PA-C or  Roby Lofts, [place]

## 2021-08-12 ENCOUNTER — Ambulatory Visit (INDEPENDENT_AMBULATORY_CARE_PROVIDER_SITE_OTHER): Payer: PPO | Admitting: *Deleted

## 2021-08-12 DIAGNOSIS — I4891 Unspecified atrial fibrillation: Secondary | ICD-10-CM

## 2021-08-12 DIAGNOSIS — I4892 Unspecified atrial flutter: Secondary | ICD-10-CM | POA: Diagnosis not present

## 2021-08-12 DIAGNOSIS — Z5181 Encounter for therapeutic drug level monitoring: Secondary | ICD-10-CM

## 2021-08-12 LAB — POCT INR: INR: 2.2 (ref 2.0–3.0)

## 2021-08-12 NOTE — Patient Instructions (Signed)
Continue warfarin 1 1/2 tablet daily except 1 tablet on Sundays, Tuesdays and Thursdays Recheck in 4 weeks

## 2021-08-28 ENCOUNTER — Encounter: Payer: Self-pay | Admitting: Internal Medicine

## 2021-08-28 ENCOUNTER — Ambulatory Visit (INDEPENDENT_AMBULATORY_CARE_PROVIDER_SITE_OTHER): Payer: PPO | Admitting: Internal Medicine

## 2021-08-28 ENCOUNTER — Telehealth: Payer: Self-pay | Admitting: Internal Medicine

## 2021-08-28 ENCOUNTER — Other Ambulatory Visit: Payer: Self-pay

## 2021-08-28 VITALS — BP 139/84 | HR 91 | Temp 96.9°F | Ht 73.0 in | Wt 171.4 lb

## 2021-08-28 DIAGNOSIS — Z1211 Encounter for screening for malignant neoplasm of colon: Secondary | ICD-10-CM | POA: Diagnosis not present

## 2021-08-28 DIAGNOSIS — R935 Abnormal findings on diagnostic imaging of other abdominal regions, including retroperitoneum: Secondary | ICD-10-CM | POA: Diagnosis not present

## 2021-08-28 NOTE — Telephone Encounter (Signed)
Cardiac clearance was sent through Epic to get ok to hold coumadin and start possible lovenox bridge.

## 2021-08-28 NOTE — Patient Instructions (Signed)
We will schedule you for upper endoscopy to further evaluate your abnormal CT scan.  At the same time we will perform colonoscopy for colon cancer screening purposes.  We will reach out to your cardiologist for their blessing in holding your Coumadin for 5 days.  You may require bridging with Lovenox.    Further recommendations to follow.  It was very nice meeting you today.  [name]  At [facility] we value your feedback. You may receive a survey about your visit today. Please share your experience as we strive to create trusting relationships with our patients to provide genuine, compassionate, quality care.  We appreciate your understanding and patience as we review any laboratory studies, imaging, and other diagnostic tests that are ordered as we care for you. Our office policy is 5 business days for review of these results, and any emergent or urgent results are addressed in a timely manner for your best interest. If you do not hear from our office in 1 week, please contact us.   We also encourage the use of MyChart, which contains your medical information for your review as well. If you are not enrolled in this feature, an access code is on this after visit summary for your convenience. Thank you for allowing [place] to be involved in your care.  It was great to see you today!  I hope you have a great rest of your summer!!    Rowyn Amstutz. Abbey Chatters, D.O. Gastroenterology and Hepatology [facility]

## 2021-08-28 NOTE — Telephone Encounter (Signed)
Attention: Preop   We would like obtain cardiac clearance for this patient please.  Procedure: EGD / Colonoscopy  Date: TBD  Medication to hold: Coumadin / Lovenox Bridge?  Surgeon: Hurshel Keys  Phone: [phone]  Fax:  [phone]  Type of Anesthesia: Propofol

## 2021-08-29 ENCOUNTER — Telehealth: Payer: Self-pay | Admitting: Internal Medicine

## 2021-08-29 NOTE — Telephone Encounter (Signed)
Spoke with the pt and he advised me that he got a call to from [facility] stating that they had nothing to do with his medication (warfarin). I advised the pt that the nurse here is in contact with their preop regarding the pt holding his warfarin and that is something that he needs to worry about. (Per pt Dr. Luan Pulling was the one who originally put him on warfarin).

## 2021-08-29 NOTE — Telephone Encounter (Signed)
Patient with diagnosis of aflutter on warfarin for anticoagulation.    Procedure: EGD / Colonoscopy Date of procedure: TBD  CHA2DS2-VASc Score = 4   This indicates a 4.8% annual risk of stroke. The patient's score is based upon: CHF History: 0 HTN History: 1 Diabetes History: 1 Stroke History: 0 Vascular Disease History: 1 Age Score: 1 Gender Score: 0     Per office protocol, patient can hold warfarin for 5 days prior to procedure.    Patient will NOT need bridging with Lovenox (enoxaparin) around procedure.

## 2021-08-29 NOTE — Telephone Encounter (Signed)
See other note

## 2021-08-29 NOTE — Telephone Encounter (Signed)
Please call patient. [phone]

## 2021-08-29 NOTE — Telephone Encounter (Signed)
Pt was last seen on 08/06/21 by Dr. Sallyanne Kuster. He has a history of mitral valve repair, nonobstructive CAD, and atrial flutter.   I will reach out to pharmD for coumadin recommendations.

## 2021-08-30 ENCOUNTER — Encounter: Payer: Self-pay | Admitting: *Deleted

## 2021-08-30 MED ORDER — PEG 3350-KCL-NA BICARB-NACL 420 G PO SOLR: ORAL | 0 refills | Status: DC

## 2021-08-30 NOTE — Telephone Encounter (Signed)
Patient returned call. He has been scheduled for 10/3 at 9:15am. Aware will mail prep instructions with his pre-op appt. Confirmed address. Confirmed pharmacy. Aware he need to hold coumadin 5 days prior to procedure. He voiced understanding.

## 2021-08-30 NOTE — Addendum Note (Signed)
Addended by: Cheron Every on: 08/30/2021 09:30 AM   Modules accepted: Orders

## 2021-08-30 NOTE — Telephone Encounter (Signed)
LMOVM for pt to call back to schedule TCS/EGD ASA 3 with [name]

## 2021-09-05 NOTE — H&P (View-Only) (Signed)
Primary Care Physician:  Celene Squibb, MD Primary Gastroenterologist:  [name]  Chief Complaint  Patient presents with   ulcer of duodenum   Abdominal Pain    Llq pain started few days ago, comes and goes    HPI:   Jacob [name] is a 70 y.o. male who presents to clinic today by referral from his PCP Dr. [name] for evaluation.  Patient notes abdominal pain which is primarily left lower quadrant.  Presented to urgent care 06/04/2021 with recommended ER evaluation but patient decided to go home instead.  States primarily left lower quadrant, does not radiate.  Intermittent in nature, mild to moderate in severity.  No previous EGD or colonoscopy.  He underwent CT abdomen pelvis on 07/10/2021 which showed wall thickening of the second and third portions of the duodenum, questionable duodenitis, ulcer disease, or mass.  Recommended endoscopy to further evaluate.  Today he states he is doing okay.  Continues to have intermittent abdominal pain though this is largely improved.  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 one vessel disease,severe MR   CARDIAC CATHETERIZATION  01/19/06   normal   CARDIAC CATHETERIZATION  06/06/10   mild nonobstructive CAD   cyst  1980   removed from wrist   MITRAL VALVE SURGERY  12/11/04   MV repair with Oletta Lamas ring,closure of PFO   RADIOFREQUENCY ABLATION  12/02/04   Dr.  Emelda Brothers   SALIVARY GLAND SURGERY  2004   growth   TONSILLECTOMY  1959    Current Outpatient Medications  Medication Sig Dispense Refill   acetaminophen (TYLENOL) 650 MG CR tablet Take 1,300 mg by mouth every 6 (six) hours as needed for pain.     allopurinol (ZYLOPRIM) 300 MG tablet TAKE 1 TABLET BY MOUTH ONCE A DAY. 90 tablet 0   cholecalciferol (VITAMIN D3) 25 MCG (1000 UNIT) tablet Take 1,000 Units by mouth daily.     lisinopril (ZESTRIL) 20 MG tablet Take 1 tablet (20 mg total) by mouth daily. 90 tablet 3   metFORMIN (GLUCOPHAGE) 500 MG tablet TAKE 1 TABLET BY MOUTH 3 TIMES DAILY. 270 tablet 3   rosuvastatin (CRESTOR) 10 MG tablet Take 1 tablet (10 mg total) by mouth daily. 90 tablet 3   torsemide (DEMADEX) 20 MG tablet Take 1-2 tablets (20-40 mg total) by mouth daily. 90 tablet 1   warfarin (COUMADIN) 5 MG tablet TAKE 1 AND 1/2 TABLET BY MOUTH ONCE DAILY OR AS DIRECTED (Patient taking differently: Takes 1 1/2 tablets 4 days a week, takes 1 tablet 3 days a week) 50 tablet 4   polyethylene glycol-electrolytes (NULYTELY) 420 g solution As directed 4000 mL 0   No current facility-administered medications for this visit.    Allergies as of 08/28/2021 - Review Complete 08/28/2021  Allergen Reaction Noted   Atenolol  Other (See Comments) 05/14/2020   Cardizem [diltiazem hcl]  03/28/2013   Metoprolol Other (See Comments) 05/14/2020   Clindamycin/lincomycin Other (See Comments) 12/28/2012    Family History  Problem Relation Age of Onset   Cancer Mother        non-hodgkins lymphoma   Glaucoma Mother     Social History   Socioeconomic History   Marital status: Married    Spouse name: Not on file   Number of children: Not on file   Years of education: Not on file   Highest education level: Not on file  Occupational History   Not on file  Tobacco Use   Smoking status: Former   Smokeless tobacco: Never  Vaping Use   Vaping Use: Never used  Substance and Sexual Activity    Alcohol use: No   Drug use: No   Sexual activity: Not on file  Other Topics Concern   Not on file  Social History Narrative   Not on file   Social Determinants of Health   Financial Resource Strain: Not on file  Food Insecurity: Not on file  Transportation Needs: Not on file  Physical Activity: Not on file  Stress: Not on file  Social Connections: Not on file  Intimate Partner Violence: Not on file    Subjective: Review of Systems  Constitutional:  Negative for chills and fever.  HENT:  Negative for congestion and hearing loss.   Eyes:  Negative for blurred vision and double vision.  Respiratory:  Negative for cough and shortness of breath.   Cardiovascular:  Negative for chest pain and palpitations.  Gastrointestinal:  Negative for abdominal pain, blood in stool, constipation, diarrhea, heartburn, melena and vomiting.  Genitourinary:  Negative for dysuria and urgency.  Musculoskeletal:  Negative for joint pain and myalgias.  Skin:  Negative for itching and rash.  Neurological:  Negative for dizziness and headaches.  Psychiatric/Behavioral:  Negative for depression. The patient is not nervous/anxious.       Objective: BP 139/84   Pulse 91   Temp (!) 96.9 F (36.1 C) (Temporal)   Ht '6\' 1"'$  (1.854 m)   Wt 171 lb 6.4 oz (77.7 kg)   BMI 22.61 kg/m  Physical Exam Constitutional:      Appearance: Normal appearance.  HENT:     Head: Normocephalic and atraumatic.  Eyes:     Extraocular Movements: Extraocular movements intact.     Conjunctiva/sclera: Conjunctivae normal.  Cardiovascular:     Rate and Rhythm: Normal rate and regular rhythm.  Pulmonary:     Effort: Pulmonary effort is normal.     Breath sounds: Normal breath sounds.  Abdominal:     General: Bowel sounds are normal.     Palpations: Abdomen is soft.  Musculoskeletal:        General: Normal range of motion.     Cervical back: Normal range of motion and neck supple.  Skin:    General: Skin is warm.   Neurological:     General: No focal deficit present.     Mental Status: He is alert and oriented to person, place, and time.  Psychiatric:        Mood and Affect: Mood normal.        Behavior: Behavior normal.     Assessment: *Abdominal pain-improved *Abnormal CT scan *Colon cancer screening *Systemic anticoagulation  Plan: Discussed patient's abnormal CT imaging in depth with him today.  Will schedule for EGD to evaluate for peptic ulcer disease, esophagitis, gastritis,  H. Pylori, duodenitis, or other. Will also evaluate for esophageal stricture, Schatzki's ring, esophageal web or other.   We will perform colonoscopy for colon cancer screening purposes at the same time.  The risks including infection, bleed, or perforation as well as benefits, limitations, alternatives and imponderables have been reviewed with the patient. Potential for esophageal dilation, biopsy, etc. have also been reviewed.  Questions have been answered. All parties agreeable.  Patient will need to hold his Coumadin for 5 days prior to procedure.  We will reach out to his cardiologist in regards to possible Lovenox bridging.  Further recommendations to follow.  Thank you Dr. [name] for the kind referral  09/05/2021 1:29 PM   Disclaimer: This note was dictated with voice recognition software. Similar sounding words can inadvertently be transcribed and may not be corrected upon review.

## 2021-09-05 NOTE — Progress Notes (Signed)
Primary Care Physician:  Celene Squibb, MD Primary Gastroenterologist:  [name]  Chief Complaint  Patient presents with   ulcer of duodenum   Abdominal Pain    Llq pain started few days ago, comes and goes    HPI:   Jacob [name] is a 70 y.o. male who presents to clinic today by referral from his PCP Dr. [name] for evaluation.  Patient notes abdominal pain which is primarily left lower quadrant.  Presented to urgent care 06/04/2021 with recommended ER evaluation but patient decided to go home instead.  States primarily left lower quadrant, does not radiate.  Intermittent in nature, mild to moderate in severity.  No previous EGD or colonoscopy.  He underwent CT abdomen pelvis on 07/10/2021 which showed wall thickening of the second and third portions of the duodenum, questionable duodenitis, ulcer disease, or mass.  Recommended endoscopy to further evaluate.  Today he states he is doing okay.  Continues to have intermittent abdominal pain though this is largely improved.  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 one vessel disease,severe MR   CARDIAC CATHETERIZATION  01/19/06   normal   CARDIAC CATHETERIZATION  06/06/10   mild nonobstructive CAD   cyst  1980   removed from wrist   MITRAL VALVE SURGERY  12/11/04   MV repair with Oletta Lamas ring,closure of PFO   RADIOFREQUENCY ABLATION  12/02/04   Dr.  Emelda Brothers   SALIVARY GLAND SURGERY  2004   growth   TONSILLECTOMY  1959    Current Outpatient Medications  Medication Sig Dispense Refill   acetaminophen (TYLENOL) 650 MG CR tablet Take 1,300 mg by mouth every 6 (six) hours as needed for pain.     allopurinol (ZYLOPRIM) 300 MG tablet TAKE 1 TABLET BY MOUTH ONCE A DAY. 90 tablet 0   cholecalciferol (VITAMIN D3) 25 MCG (1000 UNIT) tablet Take 1,000 Units by mouth daily.     lisinopril (ZESTRIL) 20 MG tablet Take 1 tablet (20 mg total) by mouth daily. 90 tablet 3   metFORMIN (GLUCOPHAGE) 500 MG tablet TAKE 1 TABLET BY MOUTH 3 TIMES DAILY. 270 tablet 3   rosuvastatin (CRESTOR) 10 MG tablet Take 1 tablet (10 mg total) by mouth daily. 90 tablet 3   torsemide (DEMADEX) 20 MG tablet Take 1-2 tablets (20-40 mg total) by mouth daily. 90 tablet 1   warfarin (COUMADIN) 5 MG tablet TAKE 1 AND 1/2 TABLET BY MOUTH ONCE DAILY OR AS DIRECTED (Patient taking differently: Takes 1 1/2 tablets 4 days a week, takes 1 tablet 3 days a week) 50 tablet 4   polyethylene glycol-electrolytes (NULYTELY) 420 g solution As directed 4000 mL 0   No current facility-administered medications for this visit.    Allergies as of 08/28/2021 - Review Complete 08/28/2021  Allergen Reaction Noted   Atenolol  Other (See Comments) 05/14/2020   Cardizem [diltiazem hcl]  03/28/2013   Metoprolol Other (See Comments) 05/14/2020   Clindamycin/lincomycin Other (See Comments) 12/28/2012    Family History  Problem Relation Age of Onset   Cancer Mother        non-hodgkins lymphoma   Glaucoma Mother     Social History   Socioeconomic History   Marital status: Married    Spouse name: Not on file   Number of children: Not on file   Years of education: Not on file   Highest education level: Not on file  Occupational History   Not on file  Tobacco Use   Smoking status: Former   Smokeless tobacco: Never  Vaping Use   Vaping Use: Never used  Substance and Sexual Activity    Alcohol use: No   Drug use: No   Sexual activity: Not on file  Other Topics Concern   Not on file  Social History Narrative   Not on file   Social Determinants of Health   Financial Resource Strain: Not on file  Food Insecurity: Not on file  Transportation Needs: Not on file  Physical Activity: Not on file  Stress: Not on file  Social Connections: Not on file  Intimate Partner Violence: Not on file    Subjective: Review of Systems  Constitutional:  Negative for chills and fever.  HENT:  Negative for congestion and hearing loss.   Eyes:  Negative for blurred vision and double vision.  Respiratory:  Negative for cough and shortness of breath.   Cardiovascular:  Negative for chest pain and palpitations.  Gastrointestinal:  Negative for abdominal pain, blood in stool, constipation, diarrhea, heartburn, melena and vomiting.  Genitourinary:  Negative for dysuria and urgency.  Musculoskeletal:  Negative for joint pain and myalgias.  Skin:  Negative for itching and rash.  Neurological:  Negative for dizziness and headaches.  Psychiatric/Behavioral:  Negative for depression. The patient is not nervous/anxious.       Objective: BP 139/84   Pulse 91   Temp (!) 96.9 F (36.1 C) (Temporal)   Ht '6\' 1"'$  (1.854 m)   Wt 171 lb 6.4 oz (77.7 kg)   BMI 22.61 kg/m  Physical Exam Constitutional:      Appearance: Normal appearance.  HENT:     Head: Normocephalic and atraumatic.  Eyes:     Extraocular Movements: Extraocular movements intact.     Conjunctiva/sclera: Conjunctivae normal.  Cardiovascular:     Rate and Rhythm: Normal rate and regular rhythm.  Pulmonary:     Effort: Pulmonary effort is normal.     Breath sounds: Normal breath sounds.  Abdominal:     General: Bowel sounds are normal.     Palpations: Abdomen is soft.  Musculoskeletal:        General: Normal range of motion.     Cervical back: Normal range of motion and neck supple.  Skin:    General: Skin is warm.   Neurological:     General: No focal deficit present.     Mental Status: He is alert and oriented to person, place, and time.  Psychiatric:        Mood and Affect: Mood normal.        Behavior: Behavior normal.     Assessment: *Abdominal pain-improved *Abnormal CT scan *Colon cancer screening *Systemic anticoagulation  Plan: Discussed patient's abnormal CT imaging in depth with him today.  Will schedule for EGD to evaluate for peptic ulcer disease, esophagitis, gastritis,  H. Pylori, duodenitis, or other. Will also evaluate for esophageal stricture, Schatzki's ring, esophageal web or other.   We will perform colonoscopy for colon cancer screening purposes at the same time.  The risks including infection, bleed, or perforation as well as benefits, limitations, alternatives and imponderables have been reviewed with the patient. Potential for esophageal dilation, biopsy, etc. have also been reviewed.  Questions have been answered. All parties agreeable.  Patient will need to hold his Coumadin for 5 days prior to procedure.  We will reach out to his cardiologist in regards to possible Lovenox bridging.  Further recommendations to follow.  Thank you Dr. [name] for the kind referral  09/05/2021 1:29 PM   Disclaimer: This note was dictated with voice recognition software. Similar sounding words can inadvertently be transcribed and may not be corrected upon review.

## 2021-09-09 ENCOUNTER — Ambulatory Visit (INDEPENDENT_AMBULATORY_CARE_PROVIDER_SITE_OTHER): Payer: PPO | Admitting: *Deleted

## 2021-09-09 ENCOUNTER — Other Ambulatory Visit: Payer: Self-pay

## 2021-09-09 DIAGNOSIS — Z5181 Encounter for therapeutic drug level monitoring: Secondary | ICD-10-CM | POA: Diagnosis not present

## 2021-09-09 DIAGNOSIS — I4891 Unspecified atrial fibrillation: Secondary | ICD-10-CM

## 2021-09-09 DIAGNOSIS — I4892 Unspecified atrial flutter: Secondary | ICD-10-CM

## 2021-09-09 LAB — POCT INR: INR: 1.8 — AB (ref 2.0–3.0)

## 2021-09-09 NOTE — Patient Instructions (Signed)
Take warfarin 2 tablets today, 1 1/2 tablets tomorrow then resume 1 1/2 tablet daily except 1 tablet on Sundays, Tuesdays and Thursdays Pending colonoscopy on 09/23/21.  Told to hold warfarin x 5 days before procedure by Dr Abbey Chatters Recheck 7-10 days after procedure

## 2021-09-10 NOTE — Telephone Encounter (Addendum)
   Name: Jacob [name]  DOB: September 14, 1951  MRN: [id number]   Primary Cardiologist: [name], MD  Chart reviewed as part of pre-operative protocol coverage. Patient was contacted 09/10/2021 in reference to pre-operative risk assessment for pending surgery as outlined below. Anticoagulation has already been addressed below and acknowledged by GI team. ("Per office protocol, patient can hold warfarin for 5 days prior to procedure.  Patient will NOT need bridging with Lovenox (enoxaparin) around procedure.")  This chart was still in preop box for clearance review. I reached out to patient for update on how he is doing. The patient affirms he has been doing well without any new cardiac symptoms or changes from last OV. Therefore, based on ACC/AHA guidelines, the patient would be at acceptable risk for the planned procedure without further cardiovascular testing. The patient was advised that if he develops new symptoms prior to surgery to contact our office to arrange for a follow-up visit, and he verbalized understanding.  I will route this recommendation to the requesting party via Epic fax function and remove from pre-op pool. Please call with questions.  Charlie Pitter, PA-C 09/10/2021, 1:51 PM

## 2021-09-17 NOTE — Patient Instructions (Signed)
Jacob [name]  09/17/2021     @PREFPERIOPPHARMACY @   Your procedure is scheduled on  09/23/2021.   [facility] to [facility] at  [place]   Call this number if you have problems the morning of surgery:  [phone]   Remember:  Follow the diet and prep instructions given to you by the office.     DO NOT take any medications for diabetes the morning of your procedure.    Take these medicines the morning of surgery with A SIP OF WATER                             allopurinol.     Do not wear jewelry, make-up or nail polish.  Do not wear lotions, powders, or perfumes, or deodorant.  Do not shave 48 hours prior to surgery.  Men may shave face and neck.  Do not bring valuables to the hospital.  [facility] is not responsible for any belongings or valuables.  Contacts, dentures or bridgework may not be worn into surgery.  Leave your suitcase in the car.  After surgery it may be brought to your room.  For patients admitted to the hospital, discharge time will be determined by your treatment team.  Patients discharged the day of surgery will not be allowed to drive home and must have someone with them for 24 hours.    Special instructions:   DO NOT smoke tobacco or vape fore 24 hours before your procedure.  Please read over the following fact sheets that you were given. Anesthesia Post-op Instructions and Care and Recovery After Surgery      Upper Endoscopy, Adult, Care After This sheet gives you information about how to care for yourself after your procedure. Your health care provider may also give you more specific instructions. If you have problems or questions, contact your health care provider. What can I expect after the procedure? After the procedure, it is common to have: A sore throat. Mild stomach pain or discomfort. Bloating. Nausea. Follow these instructions at home:  Follow instructions from your health care provider about what to eat or drink  after your procedure. Return to your normal activities as told by your health care provider. Ask your health care provider what activities are safe for you. Take over-the-counter and prescription medicines only as told by your health care provider. If you were given a sedative during the procedure, it can affect you for several hours. Do not drive or operate machinery until your health care provider says that it is safe. Keep all follow-up visits as told by your health care provider. This is important. Contact a health care provider if you have: A sore throat that lasts longer than one day. Trouble swallowing. Get help right away if: You vomit blood or your vomit looks like coffee grounds. You have: A fever. Bloody, black, or tarry stools. A severe sore throat or you cannot swallow. Difficulty breathing. Severe pain in your chest or abdomen. Summary After the procedure, it is common to have a sore throat, mild stomach discomfort, bloating, and nausea. If you were given a sedative during the procedure, it can affect you for several hours. Do not drive or operate machinery until your health care provider says that it is safe. Follow instructions from your health care provider about what to eat or drink after your procedure. Return to your normal activities  as told by your health care provider. This information is not intended to replace advice given to you by your health care provider. Make sure you discuss any questions you have with your health care provider. Document Revised: 12/06/2019 Document Reviewed: 05/10/2018 Elsevier Patient Education  2022 [employer]. Colonoscopy, Adult, Care After This sheet gives you information about how to care for yourself after your procedure. Your health care provider may also give you more specific instructions. If you have problems or questions, contact your health care provider. What can I expect after the procedure? After the procedure, it is common  to have: A small amount of blood in your stool for 24 hours after the procedure. Some gas. Mild cramping or bloating of your abdomen. Follow these instructions at home: Eating and drinking  Drink enough fluid to keep your urine pale yellow. Follow instructions from your health care provider about eating or drinking restrictions. Resume your normal diet as instructed by your health care provider. Avoid heavy or fried foods that are hard to digest. Activity Rest as told by your health care provider. Avoid sitting for a long time without moving. Get up to take short walks every 1-2 hours. This is important to improve blood flow and breathing. Ask for help if you feel weak or unsteady. Return to your normal activities as told by your health care provider. Ask your health care provider what activities are safe for you. Managing cramping and bloating  Try walking around when you have cramps or feel bloated. Apply heat to your abdomen as told by your health care provider. Use the heat source that your health care provider recommends, such as a moist heat pack or a heating pad. Place a towel between your skin and the heat source. Leave the heat on for 20-30 minutes. Remove the heat if your skin turns bright red. This is especially important if you are unable to feel pain, heat, or cold. You may have a greater risk of getting burned. General instructions If you were given a sedative during the procedure, it can affect you for several hours. Do not drive or operate machinery until your health care provider says that it is safe. For the first 24 hours after the procedure: Do not sign important documents. Do not drink alcohol. Do your regular daily activities at a slower pace than normal. Eat soft foods that are easy to digest. Take over-the-counter and prescription medicines only as told by your health care provider. Keep all follow-up visits as told by your health care provider. This is  important. Contact a health care provider if: You have blood in your stool 2-3 days after the procedure. Get help right away if you have: More than a small spotting of blood in your stool. Large blood clots in your stool. Swelling of your abdomen. Nausea or vomiting. A fever. Increasing pain in your abdomen that is not relieved with medicine. Summary After the procedure, it is common to have a small amount of blood in your stool. You may also have mild cramping and bloating of your abdomen. If you were given a sedative during the procedure, it can affect you for several hours. Do not drive or operate machinery until your health care provider says that it is safe. Get help right away if you have a lot of blood in your stool, nausea or vomiting, a fever, or increased pain in your abdomen. This information is not intended to replace advice given to you by your health care  provider. Make sure you discuss any questions you have with your health care provider. Document Revised: 12/02/2019 Document Reviewed: 07/04/2019 Elsevier Patient Education  [place] After This sheet gives you information about how to care for yourself after your procedure. Your health care provider may also give you more specific instructions. If you have problems or questions, contact your health care provider. What can I expect after the procedure? After the procedure, it is common to have: Tiredness. Forgetfulness about what happened after the procedure. Impaired judgment for important decisions. Nausea or vomiting. Some difficulty with balance. Follow these instructions at home: For the time period you were told by your health care provider:   Rest as needed. Do not participate in activities where you could fall or become injured. Do not drive or use machinery. Do not drink alcohol. Do not take sleeping pills or medicines that cause drowsiness. Do not make important  decisions or sign legal documents. Do not take care of children on your own. Eating and drinking Follow the diet that is recommended by your health care provider. Drink enough fluid to keep your urine pale yellow. If you vomit: Drink water, juice, or soup when you can drink without vomiting. Make sure you have little or no nausea before eating solid foods. General instructions Have a responsible adult stay with you for the time you are told. It is important to have someone help care for you until you are awake and alert. Take over-the-counter and prescription medicines only as told by your health care provider. If you have sleep apnea, surgery and certain medicines can increase your risk for breathing problems. Follow instructions from your health care provider about wearing your sleep device: Anytime you are sleeping, including during daytime naps. While taking prescription pain medicines, sleeping medicines, or medicines that make you drowsy. Avoid smoking. Keep all follow-up visits as told by your health care provider. This is important. Contact a health care provider if: You keep feeling nauseous or you keep vomiting. You feel light-headed. You are still sleepy or having trouble with balance after 24 hours. You develop a rash. You have a fever. You have redness or swelling around the IV site. Get help right away if: You have trouble breathing. You have new-onset confusion at home. Summary For several hours after your procedure, you may feel tired. You may also be forgetful and have poor judgment. Have a responsible adult stay with you for the time you are told. It is important to have someone help care for you until you are awake and alert. Rest as told. Do not drive or operate machinery. Do not drink alcohol or take sleeping pills. Get help right away if you have trouble breathing, or if you suddenly become confused. This information is not intended to replace advice given to you  by your health care provider. Make sure you discuss any questions you have with your health care provider. Document Revised: 08/23/2020 Document Reviewed: 11/10/2019 Elsevier Patient Education  2022 [employer].

## 2021-09-18 ENCOUNTER — Other Ambulatory Visit: Payer: Self-pay

## 2021-09-18 ENCOUNTER — Encounter (HOSPITAL_COMMUNITY)
Admission: RE | Admit: 2021-09-18 | Discharge: 2021-09-18 | Disposition: A | Discharge status: Home / Self Care | Payer: PPO | Source: Ambulatory Visit | Attending: Internal Medicine | Admitting: Internal Medicine

## 2021-09-18 DIAGNOSIS — Z01812 Encounter for preprocedural laboratory examination: Secondary | ICD-10-CM | POA: Diagnosis not present

## 2021-09-18 LAB — BASIC METABOLIC PANEL
Anion gap: 9 (ref 5–15)
BUN: 23 mg/dL (ref 8–23)
CO2: 31 mmol/L (ref 22–32)
Calcium: 9.6 mg/dL (ref 8.9–10.3)
Chloride: 101 mmol/L (ref 98–111)
Creatinine, Ser: 0.9 mg/dL (ref 0.61–1.24)
GFR, Estimated: >60 mL/min (ref >60)
Glucose, Bld: 146 mg/dL — ABNORMAL HIGH (ref 70–99)
Potassium: 4 mmol/L (ref 3.5–5.1)
Sodium: 141 mmol/L (ref 135–145)

## 2021-09-23 ENCOUNTER — Encounter (HOSPITAL_COMMUNITY): Payer: Self-pay

## 2021-09-23 ENCOUNTER — Ambulatory Visit (HOSPITAL_COMMUNITY)
Admission: RE | Admit: 2021-09-23 | Discharge: 2021-09-23 | Disposition: A | Discharge status: Home / Self Care | Payer: PPO | Attending: Internal Medicine | Admitting: Internal Medicine

## 2021-09-23 ENCOUNTER — Encounter (HOSPITAL_COMMUNITY)
Admission: RE | Disposition: A | Discharge status: Home / Self Care | Payer: Self-pay | Source: Home / Self Care | Attending: Internal Medicine

## 2021-09-23 ENCOUNTER — Other Ambulatory Visit: Payer: Self-pay

## 2021-09-23 ENCOUNTER — Ambulatory Visit (HOSPITAL_COMMUNITY): Payer: PPO | Admitting: Anesthesiology

## 2021-09-23 DIAGNOSIS — Z79899 Other long term (current) drug therapy: Secondary | ICD-10-CM | POA: Insufficient documentation

## 2021-09-23 DIAGNOSIS — R933 Abnormal findings on diagnostic imaging of other parts of digestive tract: Secondary | ICD-10-CM

## 2021-09-23 DIAGNOSIS — D120 Benign neoplasm of cecum: Secondary | ICD-10-CM

## 2021-09-23 DIAGNOSIS — K648 Other hemorrhoids: Secondary | ICD-10-CM | POA: Diagnosis not present

## 2021-09-23 DIAGNOSIS — Z87891 Personal history of nicotine dependence: Secondary | ICD-10-CM | POA: Insufficient documentation

## 2021-09-23 DIAGNOSIS — Z1211 Encounter for screening for malignant neoplasm of colon: Secondary | ICD-10-CM

## 2021-09-23 DIAGNOSIS — I10 Essential (primary) hypertension: Secondary | ICD-10-CM | POA: Diagnosis not present

## 2021-09-23 DIAGNOSIS — Z7984 Long term (current) use of oral hypoglycemic drugs: Secondary | ICD-10-CM | POA: Diagnosis not present

## 2021-09-23 DIAGNOSIS — E119 Type 2 diabetes mellitus without complications: Secondary | ICD-10-CM | POA: Insufficient documentation

## 2021-09-23 DIAGNOSIS — E785 Hyperlipidemia, unspecified: Secondary | ICD-10-CM | POA: Insufficient documentation

## 2021-09-23 DIAGNOSIS — D125 Benign neoplasm of sigmoid colon: Secondary | ICD-10-CM

## 2021-09-23 DIAGNOSIS — I4891 Unspecified atrial fibrillation: Secondary | ICD-10-CM | POA: Insufficient documentation

## 2021-09-23 DIAGNOSIS — K5730 Diverticulosis of large intestine without perforation or abscess without bleeding: Secondary | ICD-10-CM | POA: Diagnosis not present

## 2021-09-23 DIAGNOSIS — Z952 Presence of prosthetic heart valve: Secondary | ICD-10-CM | POA: Diagnosis not present

## 2021-09-23 DIAGNOSIS — Z7901 Long term (current) use of anticoagulants: Secondary | ICD-10-CM | POA: Insufficient documentation

## 2021-09-23 DIAGNOSIS — I2510 Atherosclerotic heart disease of native coronary artery without angina pectoris: Secondary | ICD-10-CM | POA: Insufficient documentation

## 2021-09-23 DIAGNOSIS — R9389 Abnormal findings on diagnostic imaging of other specified body structures: Secondary | ICD-10-CM | POA: Diagnosis not present

## 2021-09-23 DIAGNOSIS — K635 Polyp of colon: Secondary | ICD-10-CM | POA: Diagnosis not present

## 2021-09-23 HISTORY — PX: ESOPHAGOGASTRODUODENOSCOPY (EGD) WITH PROPOFOL: SHX5813

## 2021-09-23 HISTORY — PX: COLONOSCOPY WITH PROPOFOL: SHX5780

## 2021-09-23 HISTORY — PX: POLYPECTOMY: SHX5525

## 2021-09-23 HISTORY — PX: BIOPSY: SHX5522

## 2021-09-23 LAB — GLUCOSE, CAPILLARY: Glucose-Capillary: 124 mg/dL — ABNORMAL HIGH (ref 70–99)

## 2021-09-23 SURGERY — COLONOSCOPY WITH PROPOFOL
Anesthesia: General

## 2021-09-23 MED ORDER — PROPOFOL 500 MG/50ML IV EMUL
INTRAVENOUS | Status: DC | PRN
  Administered 2021-09-23: 150 ug/kg/min via INTRAVENOUS

## 2021-09-23 MED ORDER — PROPOFOL 10 MG/ML IV BOLUS
INTRAVENOUS | Status: DC | PRN
  Administered 2021-09-23: 100 mg via INTRAVENOUS
  Administered 2021-09-23: 20 mg via INTRAVENOUS

## 2021-09-23 MED ORDER — LACTATED RINGERS IV SOLN: INTRAVENOUS | Status: DC | PRN

## 2021-09-23 MED ORDER — LIDOCAINE HCL (CARDIAC) PF 100 MG/5ML IV SOSY
PREFILLED_SYRINGE | INTRAVENOUS | Status: DC | PRN
  Administered 2021-09-23: 50 mg via INTRATRACHEAL

## 2021-09-23 NOTE — Interval H&P Note (Signed)
History and Physical Interval Note:  09/23/2021 8:59 AM  Jacob [name]  has presented today for surgery, with the diagnosis of abnormal CT scan, colon cancer screening.  The various methods of treatment have been discussed with the patient and family. After consideration of risks, benefits and other options for treatment, the patient has consented to  Procedure(s) with comments: COLONOSCOPY WITH PROPOFOL (N/A) - 9:15am ESOPHAGOGASTRODUODENOSCOPY (EGD) WITH PROPOFOL (N/A) as a surgical intervention.  The patient's history has been reviewed, patient examined, no change in status, stable for surgery.  I have reviewed the patient's chart and labs.  Questions were answered to the patient's satisfaction.     [name]

## 2021-09-23 NOTE — Discharge Instructions (Signed)
EGD Discharge instructions Please read the instructions outlined below and refer to this sheet in the next few weeks. These discharge instructions provide you with general information on caring for yourself after you leave the hospital. Your doctor may also give you specific instructions. While your treatment has been planned according to the most current medical practices available, unavoidable complications occasionally occur. If you have any problems or questions after discharge, please call your doctor. ACTIVITY You may resume your regular activity but move at a slower pace for the next 24 hours.  Take frequent rest periods for the next 24 hours.  Walking will help expel (get rid of) the air and reduce the bloated feeling in your abdomen.  No driving for 24 hours (because of the anesthesia (medicine) used during the test).  You may shower.  Do not sign any important legal documents or operate any machinery for 24 hours (because of the anesthesia used during the test).  NUTRITION Drink plenty of fluids.  You may resume your normal diet.  Begin with a light meal and progress to your normal diet.  Avoid alcoholic beverages for 24 hours or as instructed by your caregiver.  MEDICATIONS You may resume your normal medications unless your caregiver tells you otherwise.  WHAT YOU CAN EXPECT TODAY You may experience abdominal discomfort such as a feeling of fullness or "gas" pains.  FOLLOW-UP Your doctor will discuss the results of your test with you.  SEEK IMMEDIATE MEDICAL ATTENTION IF ANY OF THE FOLLOWING OCCUR: Excessive nausea (feeling sick to your stomach) and/or vomiting.  Severe abdominal pain and distention (swelling).  Trouble swallowing.  Temperature over 101 F (37.8 C).  Rectal bleeding or vomiting of blood.      Colonoscopy Discharge Instructions  Read the instructions outlined below and refer to this sheet in the next few weeks. These discharge instructions provide you  with general information on caring for yourself after you leave the hospital. Your doctor may also give you specific instructions. While your treatment has been planned according to the most current medical practices available, unavoidable complications occasionally occur.   ACTIVITY You may resume your regular activity, but move at a slower pace for the next 24 hours.  Take frequent rest periods for the next 24 hours.  Walking will help get rid of the air and reduce the bloated feeling in your belly (abdomen).  No driving for 24 hours (because of the medicine (anesthesia) used during the test).   Do not sign any important legal documents or operate any machinery for 24 hours (because of the anesthesia used during the test).  NUTRITION Drink plenty of fluids.  You may resume your normal diet as instructed by your doctor.  Begin with a light meal and progress to your normal diet. Heavy or fried foods are harder to digest and may make you feel sick to your stomach (nauseated).  Avoid alcoholic beverages for 24 hours or as instructed.  MEDICATIONS You may resume your normal medications unless your doctor tells you otherwise.  WHAT YOU CAN EXPECT TODAY Some feelings of bloating in the abdomen.  Passage of more gas than usual.  Spotting of blood in your stool or on the toilet paper.  IF YOU HAD POLYPS REMOVED DURING THE COLONOSCOPY: No aspirin products for 7 days or as instructed.  No alcohol for 7 days or as instructed.  Eat a soft diet for the next 24 hours.  FINDING OUT THE RESULTS OF YOUR TEST Not all test results  are available during your visit. If your test results are not back during the visit, make an appointment with your caregiver to find out the results. Do not assume everything is normal if you have not heard from your caregiver or the medical facility. It is important for you to follow up on all of your test results.  SEEK IMMEDIATE MEDICAL ATTENTION IF: You have more than a  spotting of blood in your stool.  Your belly is swollen (abdominal distention).  You are nauseated or vomiting.  You have a temperature over 101.  You have abdominal pain or discomfort that is severe or gets worse throughout the day.   The abnormal area mentioned on your CT scan looked very healthy today on endoscopic evaluation.  I did not see any inflammation or evidence of a mass.  I did take some biopsies of your small bowel to further evaluate.  Otherwise looks healthy.  Your colonoscopy revealed 2 polyp(s) which I removed successfully. Await pathology results, my office will contact you. I recommend repeating colonoscopy in 5 years for surveillance purposes.   I hope you have a great rest of your week!  Elon Alas. Abbey Chatters, D.O. Gastroenterology and Hepatology [facility]

## 2021-09-23 NOTE — Transfer of Care (Signed)
Immediate Anesthesia Transfer of Care Note  Patient: Jacob [name]  Procedure(s) Performed: COLONOSCOPY WITH PROPOFOL ESOPHAGOGASTRODUODENOSCOPY (EGD) WITH PROPOFOL BIOPSY POLYPECTOMY  Patient Location: Short Stay  Anesthesia Type:General  Level of Consciousness: drowsy  Airway & Oxygen Therapy: Patient Spontanous Breathing  Post-op Assessment: Report given to RN and Post -op Vital signs reviewed and stable  Post vital signs: Reviewed and stable  Last Vitals:  Vitals Value Taken Time  BP    Temp    Pulse    Resp    SpO2      Last Pain:  Vitals:   09/23/21 0801  TempSrc: Oral  PainSc: 0-No pain         Complications: No notable events documented.

## 2021-09-23 NOTE — Anesthesia Preprocedure Evaluation (Signed)
Anesthesia Evaluation  Patient identified by MRN, date of birth, ID band Patient awake    Reviewed: Allergy & Precautions, H&P , NPO status , Patient's Chart, lab work & pertinent test results, reviewed documented beta blocker date and time   Airway Mallampati: II  TM Distance: >3 FB Neck ROM: full    Dental no notable dental hx.    Pulmonary neg pulmonary ROS, former smoker,    Pulmonary exam normal breath sounds clear to auscultation       Cardiovascular Exercise Tolerance: Good hypertension, + CAD   Rhythm:regular Rate:Normal     Neuro/Psych negative neurological ROS  negative psych ROS   GI/Hepatic negative GI ROS, Neg liver ROS,   Endo/Other  negative endocrine ROSdiabetes, Type 2  Renal/GU negative Renal ROS  negative genitourinary   Musculoskeletal   Abdominal   Peds  Hematology negative hematology ROS (+)   Anesthesia Other Findings   Reproductive/Obstetrics negative OB ROS                             Anesthesia Physical Anesthesia Plan  ASA: 3  Anesthesia Plan: General   Post-op Pain Management:    Induction:   PONV Risk Score and Plan: Propofol infusion  Airway Management Planned:   Additional Equipment:   Intra-op Plan:   Post-operative Plan:   Informed Consent: I have reviewed the patients History and Physical, chart, labs and discussed the procedure including the risks, benefits and alternatives for the proposed anesthesia with the patient or authorized representative who has indicated his/her understanding and acceptance.     Dental Advisory Given  Plan Discussed with: CRNA  Anesthesia Plan Comments:         Anesthesia Quick Evaluation

## 2021-09-23 NOTE — Op Note (Signed)
[facility] Patient Name: Jacob [name] Procedure Date: 09/23/2021 8:57 AM MRN: [id number] Date of Birth: 1951-07-20 Attending MD: Elon Alas. Abbey Chatters DO CSN: [id number] Age: 71 Admit Type: Outpatient Procedure:                Upper GI endoscopy Indications:              Abnormal CT of the GI tract Providers:                Elon Alas. Abbey Chatters, DO, Gwynneth Albright RN, RN,                            Aram Candela Referring MD:              Medicines:                See the Anesthesia note for documentation of the                            administered medications Complications:            No immediate complications. Estimated Blood Loss:     Estimated blood loss was minimal. Procedure:                Pre-Anesthesia Assessment:                           - The anesthesia plan was to use monitored                            anesthesia care (MAC).                           After obtaining informed consent, the endoscope was                            passed under direct vision. Throughout the                            procedure, the patient's blood pressure, pulse, and                            oxygen saturations were monitored continuously. The                            [id number]-[id number] ([id number]) scope was introduced through the                            mouth, and advanced to the third part of duodenum.                            The upper GI endoscopy was accomplished without                            difficulty. The patient tolerated the procedure                            well.  Scope In: 9:12:03 AM Scope Out: 9:15:48 AM Total Procedure Duration: 0 hours 3 minutes 45 seconds  Findings:      There is no endoscopic evidence of Barrett's esophagus, bleeding, areas       of erosion, esophagitis, ulcerations or varices in the entire esophagus.      The Z-line was regular and was found 43 cm from the incisors.      The entire examined stomach was normal.      The duodenal bulb,  first portion of the duodenum, second portion of the       duodenum, third portion of the duodenum and fourth portion of the       duodenum were normal. Biopsies were taken with a cold forceps for       histology. Impression:               - Z-line regular, 43 cm from the incisors.                           - Normal stomach.                           - Normal duodenal bulb, first portion of the                            duodenum, second portion of the duodenum, third                            portion of the duodenum and fourth portion of the                            duodenum. Biopsied. Moderate Sedation:      Per Anesthesia Care Recommendation:           - Patient has a contact number available for                            emergencies. The signs and symptoms of potential                            delayed complications were discussed with the                            patient. Return to normal activities tomorrow.                            Written discharge instructions were provided to the                            patient.                           - Resume previous diet.                           - Continue present medications.                           -  Await pathology results.                           - Return to GI clinic PRN. Procedure Code(s):        --- Professional ---                           [phone], Esophagogastroduodenoscopy, flexible,                            transoral; with biopsy, single or multiple Diagnosis Code(s):        --- Professional ---                           R93.3, Abnormal findings on diagnostic imaging of                            other parts of digestive tract CPT copyright 2019 American Medical Association. All rights reserved. The codes documented in this report are preliminary and upon coder review may  be revised to meet current compliance requirements. Elon Alas. Abbey Chatters, DO [place] Abbey Chatters, DO 09/23/2021 9:18:13 AM This report has  been signed electronically. Number of Addenda: 0

## 2021-09-23 NOTE — Anesthesia Postprocedure Evaluation (Signed)
Anesthesia Post Note  Patient: GIFFORD BALLON  Procedure(s) Performed: COLONOSCOPY WITH PROPOFOL ESOPHAGOGASTRODUODENOSCOPY (EGD) WITH PROPOFOL BIOPSY POLYPECTOMY  Patient location during evaluation: Phase II Anesthesia Type: General Level of consciousness: awake Pain management: pain level controlled Vital Signs Assessment: post-procedure vital signs reviewed and stable Respiratory status: spontaneous breathing and respiratory function stable Cardiovascular status: blood pressure returned to baseline and stable Postop Assessment: no headache and no apparent nausea or vomiting Anesthetic complications: no Comments: Late entry   No notable events documented.   Last Vitals:  Vitals:   09/23/21 0801 09/23/21 0942  BP: (!) 178/82 (!) 102/58  Pulse: 75 62  Resp: 16 16  Temp: 36.7 C 36.7 C  SpO2: 100% 100%    Last Pain:  Vitals:   09/23/21 0942  TempSrc: Oral  PainSc: 0-No pain                 Louann Sjogren

## 2021-09-23 NOTE — Op Note (Signed)
[facility] Patient Name: Jacob [name] Procedure Date: 09/23/2021 9:16 AM MRN: [id number] Date of Birth: 01-13-1951 Attending MD: Elon Alas. Abbey Chatters DO CSN: [id number] Age: 70 Admit Type: Outpatient Procedure:                Colonoscopy Indications:              Screening for colorectal malignant neoplasm Providers:                Elon Alas. Abbey Chatters, DO, Gwynneth Albright RN, RN,                            Aram Candela Referring MD:              Medicines:                See the Anesthesia note for documentation of the                            administered medications Complications:            No immediate complications. Estimated Blood Loss:     Estimated blood loss was minimal. Procedure:                Pre-Anesthesia Assessment:                           - The anesthesia plan was to use monitored                            anesthesia care (MAC).                           After obtaining informed consent, the colonoscope                            was passed under direct vision. Throughout the                            procedure, the patient's blood pressure, pulse, and                            oxygen saturations were monitored continuously. The                            PCF-HQ190L ([id number]) scope was introduced through                            the anus and advanced to the the cecum, identified                            by appendiceal orifice and ileocecal valve. The                            colonoscopy was performed without difficulty. The                            patient tolerated the procedure well.  The quality                            of the bowel preparation was evaluated using the                            BBPS [facility] Bowel Preparation Scale) with scores                            of: Right Colon = 3, Transverse Colon = 3 and Left                            Colon = 3 (entire mucosa seen well with no residual                            staining,  small fragments of stool or opaque                            liquid). The total BBPS score equals 9. Scope In: 9:19:38 AM Scope Out: 9:37:59 AM Scope Withdrawal Time: 0 hours 13 minutes 18 seconds  Total Procedure Duration: 0 hours 18 minutes 21 seconds  Findings:      The perianal and digital rectal examinations were normal.      Non-bleeding internal hemorrhoids were found during endoscopy.      A few small-mouthed diverticula were found in the sigmoid colon.      A 5 mm polyp was found in the cecum. The polyp was flat. The polyp was       removed with a cold snare. Resection and retrieval were complete.      A 3 mm polyp was found in the sigmoid colon. The polyp was sessile. The       polyp was removed with a cold snare. Resection and retrieval were       complete. Impression:               - Non-bleeding internal hemorrhoids.                           - Diverticulosis in the sigmoid colon.                           - One 5 mm polyp in the cecum, removed with a cold                            snare. Resected and retrieved.                           - One 3 mm polyp in the sigmoid colon, removed with                            a cold snare. Resected and retrieved. Moderate Sedation:      Per Anesthesia Care Recommendation:           - Patient has a contact number available for  emergencies. The signs and symptoms of potential                            delayed complications were discussed with the                            patient. Return to normal activities tomorrow.                            Written discharge instructions were provided to the                            patient.                           - Resume previous diet.                           - Continue present medications.                           - Await pathology results.                           - Repeat colonoscopy in 5 years for surveillance.                           - Return to GI  clinic PRN. Procedure Code(s):        --- Professional ---                           [phone], Colonoscopy, flexible; with removal of                            tumor(s), polyp(s), or other lesion(s) by snare                            technique Diagnosis Code(s):        --- Professional ---                           Z12.11, Encounter for screening for malignant                            neoplasm of colon                           K64.8, Other hemorrhoids                           K63.5, Polyp of colon                           K57.30, Diverticulosis of large intestine without                            perforation or abscess without bleeding CPT copyright 2019 American Medical  Association. All rights reserved. The codes documented in this report are preliminary and upon coder review may  be revised to meet current compliance requirements. Elon Alas. Abbey Chatters, DO [place], DO 09/23/2021 9:40:37 AM This report has been signed electronically. Number of Addenda: 0

## 2021-09-24 LAB — SURGICAL PATHOLOGY

## 2021-09-30 ENCOUNTER — Encounter (HOSPITAL_COMMUNITY): Payer: Self-pay | Admitting: Internal Medicine

## 2021-10-03 ENCOUNTER — Ambulatory Visit (INDEPENDENT_AMBULATORY_CARE_PROVIDER_SITE_OTHER): Payer: PPO | Admitting: *Deleted

## 2021-10-03 ENCOUNTER — Other Ambulatory Visit: Payer: Self-pay

## 2021-10-03 DIAGNOSIS — Z5181 Encounter for therapeutic drug level monitoring: Secondary | ICD-10-CM | POA: Diagnosis not present

## 2021-10-03 DIAGNOSIS — I4891 Unspecified atrial fibrillation: Secondary | ICD-10-CM | POA: Diagnosis not present

## 2021-10-03 DIAGNOSIS — I4892 Unspecified atrial flutter: Secondary | ICD-10-CM | POA: Diagnosis not present

## 2021-10-03 LAB — POCT INR: INR: 1.9 — AB (ref 2.0–3.0)

## 2021-10-03 NOTE — Patient Instructions (Signed)
Increase coumadin to 1 1/2 tablet daily except 1 tablet on Sundays Recheck in 3 weeks

## 2021-10-09 DIAGNOSIS — E1165 Type 2 diabetes mellitus with hyperglycemia: Secondary | ICD-10-CM | POA: Diagnosis not present

## 2021-10-21 DIAGNOSIS — I10 Essential (primary) hypertension: Secondary | ICD-10-CM | POA: Diagnosis not present

## 2021-10-23 ENCOUNTER — Other Ambulatory Visit: Payer: Self-pay | Admitting: Internal Medicine

## 2021-10-23 ENCOUNTER — Other Ambulatory Visit (HOSPITAL_COMMUNITY): Payer: Self-pay | Admitting: Internal Medicine

## 2021-10-23 DIAGNOSIS — R109 Unspecified abdominal pain: Secondary | ICD-10-CM | POA: Diagnosis not present

## 2021-10-23 DIAGNOSIS — R911 Solitary pulmonary nodule: Secondary | ICD-10-CM

## 2021-10-23 DIAGNOSIS — E782 Mixed hyperlipidemia: Secondary | ICD-10-CM | POA: Diagnosis not present

## 2021-10-23 DIAGNOSIS — Z86010 Personal history of colonic polyps: Secondary | ICD-10-CM | POA: Diagnosis not present

## 2021-10-23 DIAGNOSIS — M109 Gout, unspecified: Secondary | ICD-10-CM | POA: Diagnosis not present

## 2021-10-23 DIAGNOSIS — I2510 Atherosclerotic heart disease of native coronary artery without angina pectoris: Secondary | ICD-10-CM | POA: Diagnosis not present

## 2021-10-23 DIAGNOSIS — I484 Atypical atrial flutter: Secondary | ICD-10-CM | POA: Diagnosis not present

## 2021-10-23 DIAGNOSIS — Z9889 Other specified postprocedural states: Secondary | ICD-10-CM | POA: Diagnosis not present

## 2021-10-23 DIAGNOSIS — E1165 Type 2 diabetes mellitus with hyperglycemia: Secondary | ICD-10-CM | POA: Diagnosis not present

## 2021-10-23 DIAGNOSIS — I10 Essential (primary) hypertension: Secondary | ICD-10-CM | POA: Diagnosis not present

## 2021-10-28 ENCOUNTER — Ambulatory Visit (INDEPENDENT_AMBULATORY_CARE_PROVIDER_SITE_OTHER): Payer: PPO | Admitting: *Deleted

## 2021-10-28 DIAGNOSIS — Z5181 Encounter for therapeutic drug level monitoring: Secondary | ICD-10-CM | POA: Diagnosis not present

## 2021-10-28 DIAGNOSIS — I4892 Unspecified atrial flutter: Secondary | ICD-10-CM

## 2021-10-28 DIAGNOSIS — I4891 Unspecified atrial fibrillation: Secondary | ICD-10-CM | POA: Diagnosis not present

## 2021-10-28 DIAGNOSIS — I484 Atypical atrial flutter: Secondary | ICD-10-CM | POA: Diagnosis not present

## 2021-10-28 LAB — POCT INR: INR: 3.1 — AB (ref 2.0–3.0)

## 2021-10-28 NOTE — Patient Instructions (Signed)
Continue warfarin 1 1/2 tablet daily except 1 tablet on Sundays Recheck in 4 weeks

## 2021-11-16 ENCOUNTER — Other Ambulatory Visit: Payer: Self-pay | Admitting: Cardiology

## 2021-11-25 ENCOUNTER — Ambulatory Visit (INDEPENDENT_AMBULATORY_CARE_PROVIDER_SITE_OTHER): Payer: PPO | Admitting: *Deleted

## 2021-11-25 ENCOUNTER — Other Ambulatory Visit: Payer: Self-pay

## 2021-11-25 DIAGNOSIS — I4891 Unspecified atrial fibrillation: Secondary | ICD-10-CM | POA: Diagnosis not present

## 2021-11-25 DIAGNOSIS — I4892 Unspecified atrial flutter: Secondary | ICD-10-CM

## 2021-11-25 DIAGNOSIS — Z5181 Encounter for therapeutic drug level monitoring: Secondary | ICD-10-CM

## 2021-11-25 LAB — POCT INR: INR: 2.2 (ref 2.0–3.0)

## 2021-11-25 NOTE — Patient Instructions (Signed)
Continue warfarin 1 1/2 tablet daily except 1 tablet on Sundays Recheck in 5 weeks

## 2021-11-28 ENCOUNTER — Other Ambulatory Visit: Payer: Self-pay

## 2021-11-28 ENCOUNTER — Ambulatory Visit (HOSPITAL_COMMUNITY)
Admission: RE | Admit: 2021-11-28 | Discharge: 2021-11-28 | Disposition: A | Discharge status: Home / Self Care | Payer: PPO | Source: Ambulatory Visit | Attending: Internal Medicine | Admitting: Internal Medicine

## 2021-11-28 DIAGNOSIS — R911 Solitary pulmonary nodule: Secondary | ICD-10-CM | POA: Insufficient documentation

## 2021-11-28 DIAGNOSIS — I700 Atherosclerosis of aorta: Secondary | ICD-10-CM | POA: Diagnosis not present

## 2021-12-19 DIAGNOSIS — H0102A Squamous blepharitis right eye, upper and lower eyelids: Secondary | ICD-10-CM | POA: Diagnosis not present

## 2021-12-19 DIAGNOSIS — H40013 Open angle with borderline findings, low risk, bilateral: Secondary | ICD-10-CM | POA: Diagnosis not present

## 2021-12-19 DIAGNOSIS — H2513 Age-related nuclear cataract, bilateral: Secondary | ICD-10-CM | POA: Diagnosis not present

## 2021-12-19 DIAGNOSIS — H43821 Vitreomacular adhesion, right eye: Secondary | ICD-10-CM | POA: Diagnosis not present

## 2021-12-19 DIAGNOSIS — H04123 Dry eye syndrome of bilateral lacrimal glands: Secondary | ICD-10-CM | POA: Diagnosis not present

## 2021-12-19 DIAGNOSIS — H0102B Squamous blepharitis left eye, upper and lower eyelids: Secondary | ICD-10-CM | POA: Diagnosis not present

## 2021-12-19 DIAGNOSIS — E119 Type 2 diabetes mellitus without complications: Secondary | ICD-10-CM | POA: Diagnosis not present

## 2021-12-19 DIAGNOSIS — H33321 Round hole, right eye: Secondary | ICD-10-CM | POA: Diagnosis not present

## 2021-12-19 DIAGNOSIS — H02831 Dermatochalasis of right upper eyelid: Secondary | ICD-10-CM | POA: Diagnosis not present

## 2021-12-19 DIAGNOSIS — H02834 Dermatochalasis of left upper eyelid: Secondary | ICD-10-CM | POA: Diagnosis not present

## 2021-12-30 ENCOUNTER — Ambulatory Visit (INDEPENDENT_AMBULATORY_CARE_PROVIDER_SITE_OTHER): Payer: PPO | Admitting: *Deleted

## 2021-12-30 DIAGNOSIS — I4892 Unspecified atrial flutter: Secondary | ICD-10-CM

## 2021-12-30 DIAGNOSIS — Z5181 Encounter for therapeutic drug level monitoring: Secondary | ICD-10-CM

## 2021-12-30 DIAGNOSIS — I4891 Unspecified atrial fibrillation: Secondary | ICD-10-CM

## 2021-12-30 LAB — POCT INR: INR: 1.6 — AB (ref 2.0–3.0)

## 2021-12-30 NOTE — Patient Instructions (Signed)
Take warfarin 2 tablets today and tomorrow then resume 1 1/2 tablet daily except 1 tablet on Sundays Recheck in 2 weeks

## 2022-01-13 ENCOUNTER — Other Ambulatory Visit: Payer: Self-pay

## 2022-01-13 ENCOUNTER — Ambulatory Visit (INDEPENDENT_AMBULATORY_CARE_PROVIDER_SITE_OTHER): Payer: PPO | Admitting: *Deleted

## 2022-01-13 DIAGNOSIS — I4891 Unspecified atrial fibrillation: Secondary | ICD-10-CM

## 2022-01-13 DIAGNOSIS — Z5181 Encounter for therapeutic drug level monitoring: Secondary | ICD-10-CM | POA: Diagnosis not present

## 2022-01-13 DIAGNOSIS — I4892 Unspecified atrial flutter: Secondary | ICD-10-CM

## 2022-01-13 LAB — POCT INR: INR: 2.4 (ref 2.0–3.0)

## 2022-01-13 IMAGING — CT CT ABD-PELV W/O CM
2 of 4 series · 15 of 46 positions shown, 17 images · non-contrast
Comparison: None

CLINICAL DATA: Abdominal pain, BILATERAL lower abdominal pain for
3-4 weeks, history coronary artery disease, atrial fibrillation,
MVR, hypertension, type II diabetes mellitus

EXAM:
CT ABDOMEN AND PELVIS WITHOUT CONTRAST
TECHNIQUE: Multidetector CT imaging of the abdomen and pelvis was performed
following the standard protocol without IV contrast. Sagittal and
coronal MPR images reconstructed from axial data set. Patient drank
dilute oral contrast for exam.

[Series 2: axial st · axial · 0.74mm/px · z∈[+523,+1018]mm · 12 of 111 slices shown, 14 images]
[im 6/111  soft-tissue]
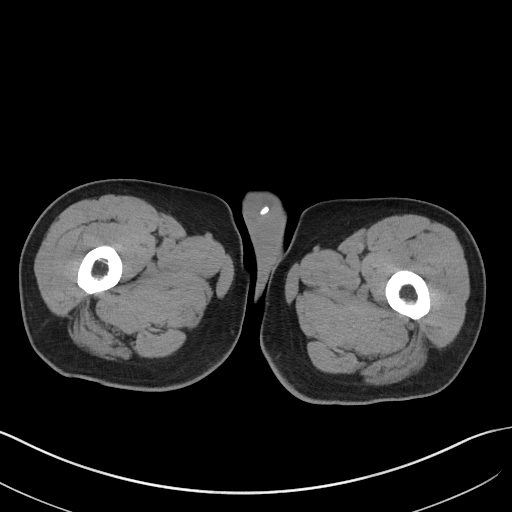
[im 6/111  bone]
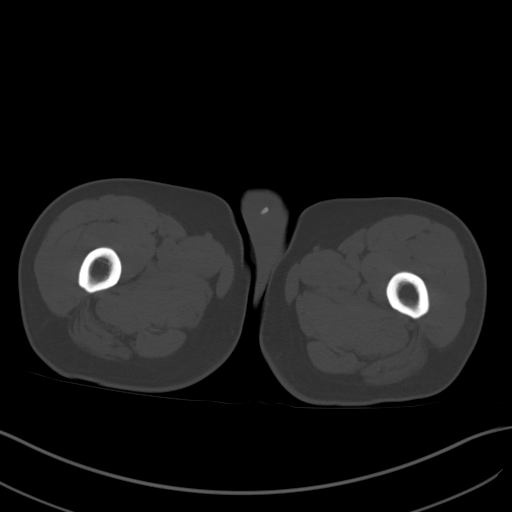
[im 18/111  soft-tissue]
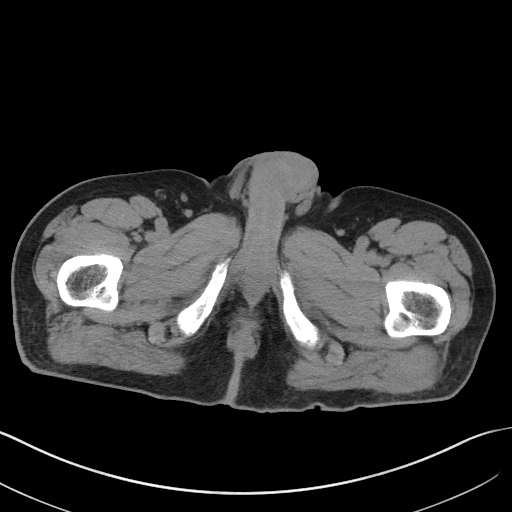
[im 24/111  soft-tissue]
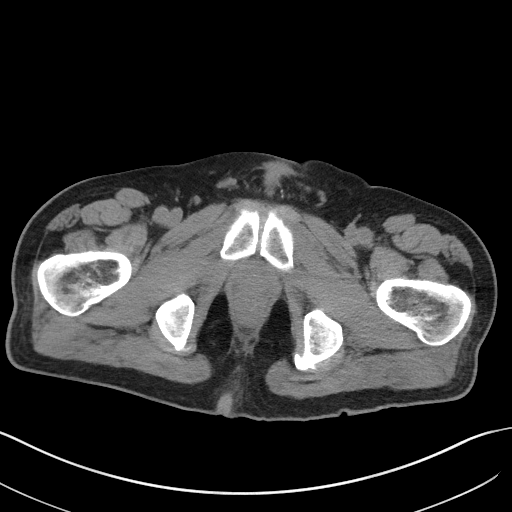
[im 35/111  soft-tissue]
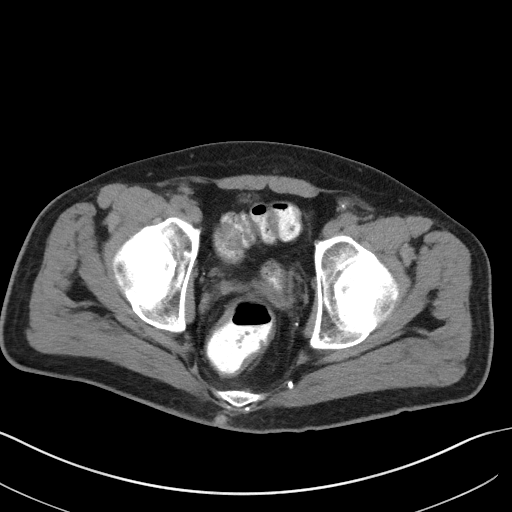
[im 41/111  soft-tissue]
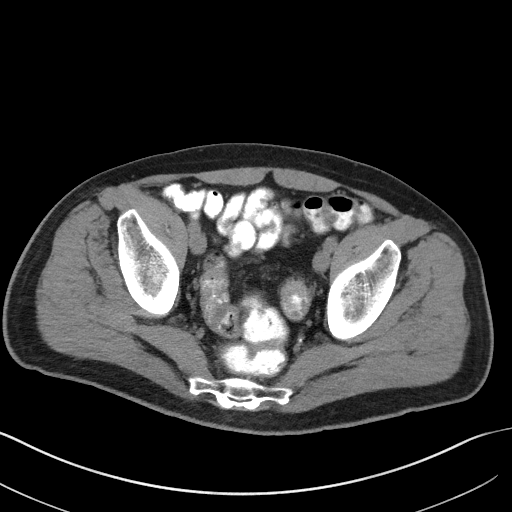
[im 53/111  soft-tissue]
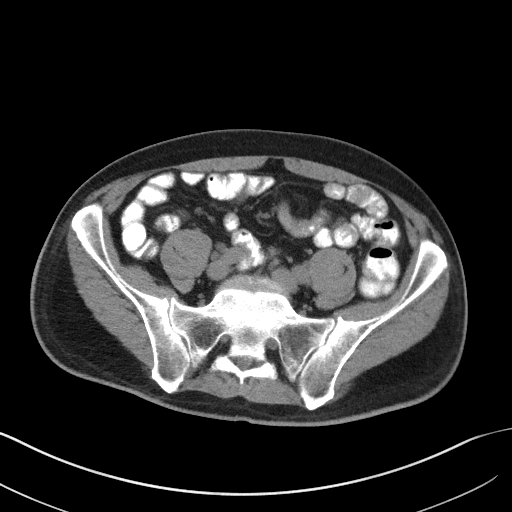
[im 58/111  soft-tissue]
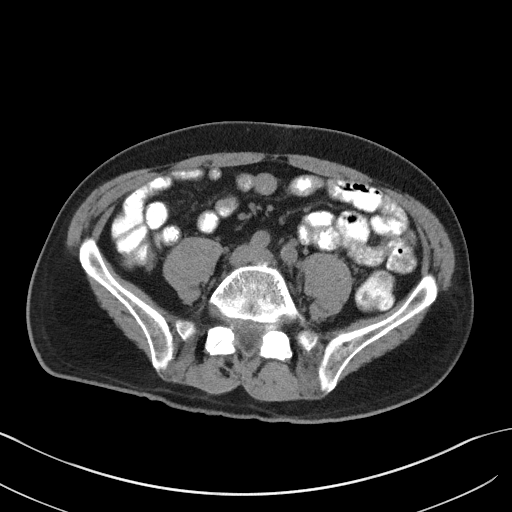
[im 70/111  soft-tissue]
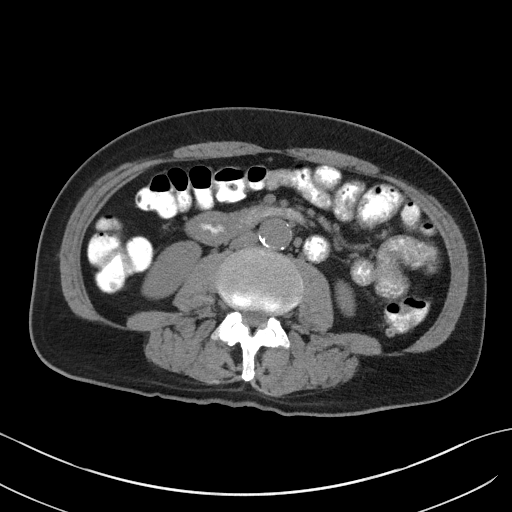
[im 76/111  soft-tissue]
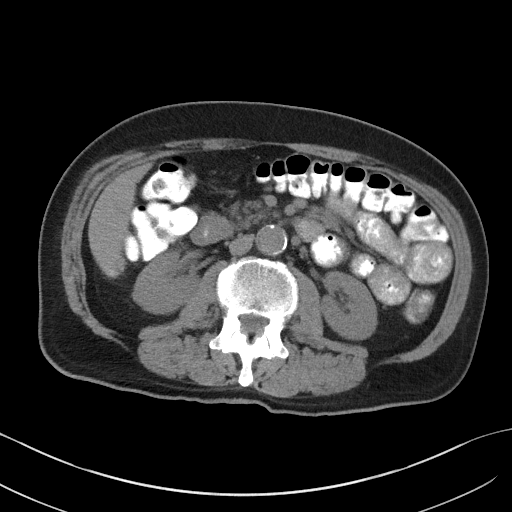
[im 76/111  bone]
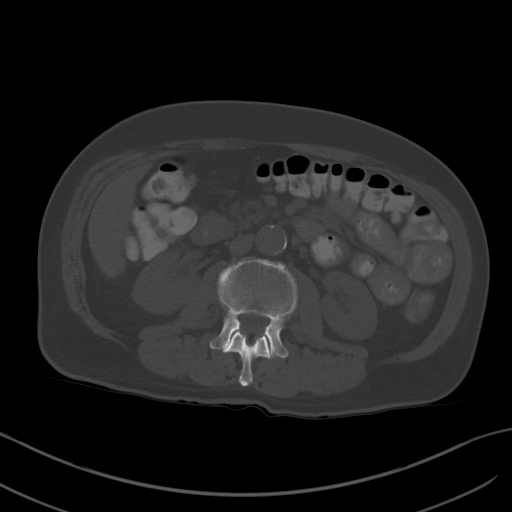
[im 87/111  soft-tissue]
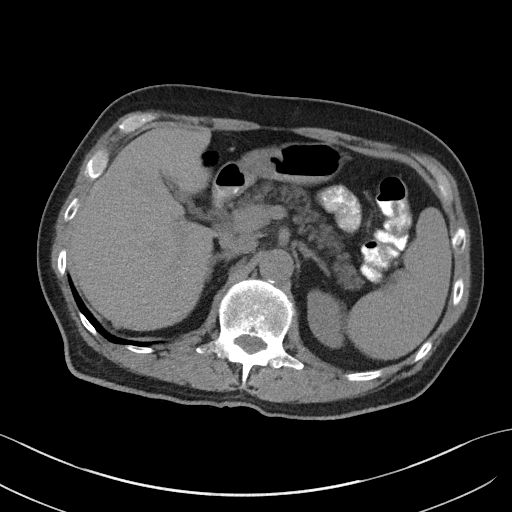
[im 93/111  soft-tissue]
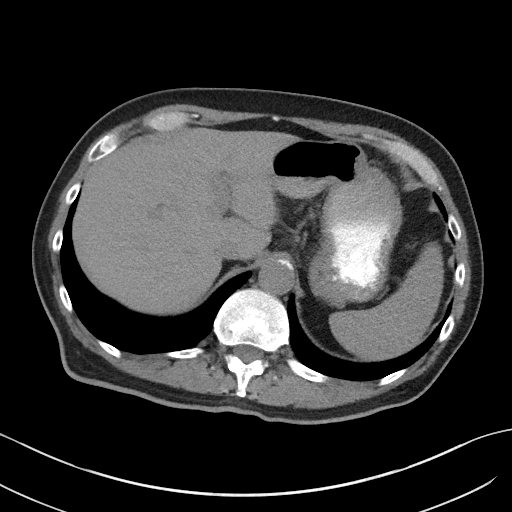
[im 105/111  soft-tissue]
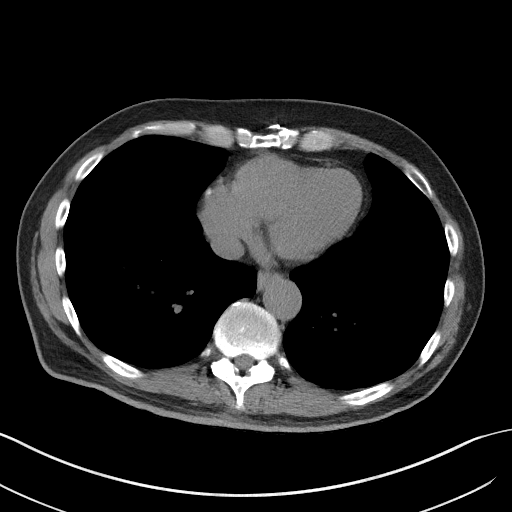

[Series 5: coronal st · coronal · 0.85mm/px · 3 of 103 slices shown]
[im 35/103  soft-tissue]
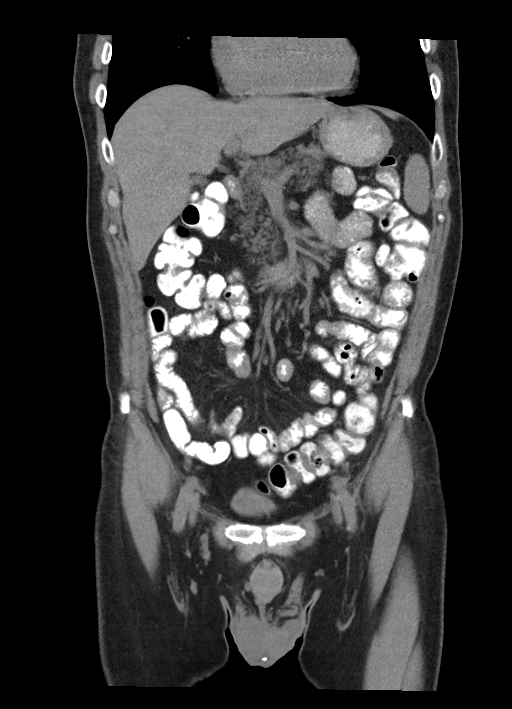
[im 46/103  soft-tissue]
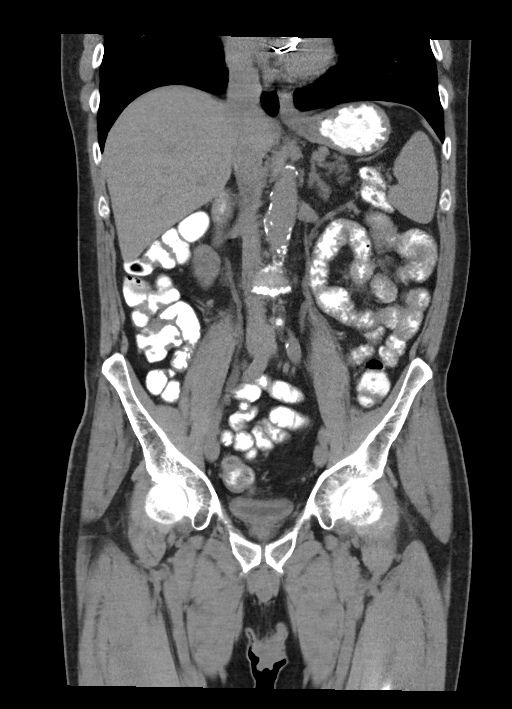
[im 57/103  soft-tissue]
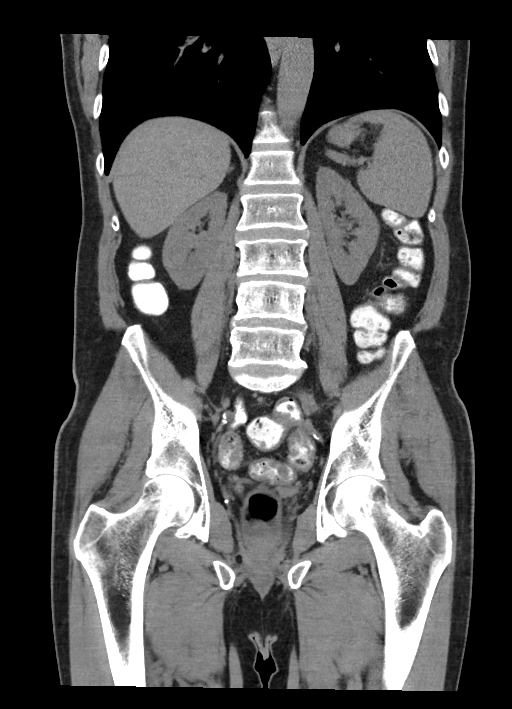

[15 of 46 positions shown; findings below may reference images not displayed]

FINDINGS: Lower chest: Calcified granuloma LEFT lower lobe image 28. Lung
bases otherwise clear.

Hepatobiliary: Contracted gallbladder.  Liver unremarkable.

Pancreas: Normal appearance

Spleen: Normal appearance

Adrenals/Urinary Tract: Adrenal glands, kidneys, ureters, and
bladder normal appearance.

Stomach/Bowel: Normal appendix. Wall thickening of the second and
proximal third portions of duodenum without surrounding inflammatory
changes. Gastric wall thickness incompletely assessed due to
underdistention. Remaining bowel loops normal appearance

Vascular/Lymphatic: Atherosclerotic calcifications aorta and iliac
arteries without aneurysm. No adenopathy.

Reproductive: Mild prostatic enlargement gland measuring 4.5 x
cm image 84.

Other: Probable small BILATERAL inguinal hernias containing fat.
Tiny umbilical hernia containing fat. No free air or free fluid. No
inflammatory process.

Musculoskeletal: Osseous demineralization.
IMPRESSION: Wall thickening of the second and proximal third portions of
duodenum without surrounding inflammatory changes, question
duodenitis or ulcer disease, recommend correlation with endoscopy to
exclude mass.

Mild prostatic enlargement.

Probable small BILATERAL inguinal and tiny umbilical hernias
containing fat.

No other intra-abdominal or intrapelvic abnormalities.

Aortic Atherosclerosis ([id number]-GEY.Y).

## 2022-01-13 NOTE — Patient Instructions (Signed)
Continue warfarin 1 1/2 tablet daily except 1 tablet on Sundays Recheck in 4 weeks

## 2022-01-20 DIAGNOSIS — E782 Mixed hyperlipidemia: Secondary | ICD-10-CM | POA: Diagnosis not present

## 2022-01-20 DIAGNOSIS — E1165 Type 2 diabetes mellitus with hyperglycemia: Secondary | ICD-10-CM | POA: Diagnosis not present

## 2022-01-23 DIAGNOSIS — E1165 Type 2 diabetes mellitus with hyperglycemia: Secondary | ICD-10-CM | POA: Diagnosis not present

## 2022-01-23 DIAGNOSIS — I484 Atypical atrial flutter: Secondary | ICD-10-CM | POA: Diagnosis not present

## 2022-01-23 DIAGNOSIS — I2510 Atherosclerotic heart disease of native coronary artery without angina pectoris: Secondary | ICD-10-CM | POA: Diagnosis not present

## 2022-01-23 DIAGNOSIS — R911 Solitary pulmonary nodule: Secondary | ICD-10-CM | POA: Diagnosis not present

## 2022-01-23 DIAGNOSIS — Z9889 Other specified postprocedural states: Secondary | ICD-10-CM | POA: Diagnosis not present

## 2022-01-23 DIAGNOSIS — E782 Mixed hyperlipidemia: Secondary | ICD-10-CM | POA: Diagnosis not present

## 2022-01-23 DIAGNOSIS — Z86010 Personal history of colonic polyps: Secondary | ICD-10-CM | POA: Diagnosis not present

## 2022-01-23 DIAGNOSIS — M109 Gout, unspecified: Secondary | ICD-10-CM | POA: Diagnosis not present

## 2022-01-23 DIAGNOSIS — I10 Essential (primary) hypertension: Secondary | ICD-10-CM | POA: Diagnosis not present

## 2022-02-11 ENCOUNTER — Ambulatory Visit (INDEPENDENT_AMBULATORY_CARE_PROVIDER_SITE_OTHER): Payer: PPO | Admitting: *Deleted

## 2022-02-11 DIAGNOSIS — Z5181 Encounter for therapeutic drug level monitoring: Secondary | ICD-10-CM | POA: Diagnosis not present

## 2022-02-11 DIAGNOSIS — I4891 Unspecified atrial fibrillation: Secondary | ICD-10-CM

## 2022-02-11 DIAGNOSIS — I4892 Unspecified atrial flutter: Secondary | ICD-10-CM | POA: Diagnosis not present

## 2022-02-11 LAB — POCT INR: INR: 2.2 (ref 2.0–3.0)

## 2022-02-11 NOTE — Patient Instructions (Signed)
Continue warfarin 1 1/2 tablet daily except 1 tablet on Sundays Recheck in 4 weeks

## 2022-03-06 DIAGNOSIS — I10 Essential (primary) hypertension: Secondary | ICD-10-CM | POA: Diagnosis not present

## 2022-03-06 DIAGNOSIS — Z9889 Other specified postprocedural states: Secondary | ICD-10-CM | POA: Diagnosis not present

## 2022-03-06 DIAGNOSIS — R600 Localized edema: Secondary | ICD-10-CM | POA: Diagnosis not present

## 2022-03-06 DIAGNOSIS — E1165 Type 2 diabetes mellitus with hyperglycemia: Secondary | ICD-10-CM | POA: Diagnosis not present

## 2022-03-11 ENCOUNTER — Ambulatory Visit (INDEPENDENT_AMBULATORY_CARE_PROVIDER_SITE_OTHER): Payer: PPO | Admitting: *Deleted

## 2022-03-11 DIAGNOSIS — I4892 Unspecified atrial flutter: Secondary | ICD-10-CM

## 2022-03-11 DIAGNOSIS — I4891 Unspecified atrial fibrillation: Secondary | ICD-10-CM | POA: Diagnosis not present

## 2022-03-11 DIAGNOSIS — Z5181 Encounter for therapeutic drug level monitoring: Secondary | ICD-10-CM

## 2022-03-11 LAB — POCT INR: INR: 2.7 (ref 2.0–3.0)

## 2022-03-11 NOTE — Patient Instructions (Signed)
Continue warfarin 1 1/2 tablet daily except 1 tablet on Sundays ?Recheck in 6 weeks ?

## 2022-03-31 DIAGNOSIS — I959 Hypotension, unspecified: Secondary | ICD-10-CM | POA: Diagnosis not present

## 2022-03-31 DIAGNOSIS — R1013 Epigastric pain: Secondary | ICD-10-CM | POA: Diagnosis not present

## 2022-04-04 DIAGNOSIS — I959 Hypotension, unspecified: Secondary | ICD-10-CM | POA: Diagnosis not present

## 2022-04-04 DIAGNOSIS — R1013 Epigastric pain: Secondary | ICD-10-CM | POA: Diagnosis not present

## 2022-04-04 DIAGNOSIS — R000 Tachycardia, unspecified: Secondary | ICD-10-CM | POA: Diagnosis not present

## 2022-04-10 DIAGNOSIS — I959 Hypotension, unspecified: Secondary | ICD-10-CM | POA: Diagnosis not present

## 2022-04-10 DIAGNOSIS — R1013 Epigastric pain: Secondary | ICD-10-CM | POA: Diagnosis not present

## 2022-04-10 DIAGNOSIS — R350 Frequency of micturition: Secondary | ICD-10-CM | POA: Diagnosis not present

## 2022-04-10 DIAGNOSIS — E1165 Type 2 diabetes mellitus with hyperglycemia: Secondary | ICD-10-CM | POA: Diagnosis not present

## 2022-04-22 ENCOUNTER — Ambulatory Visit (INDEPENDENT_AMBULATORY_CARE_PROVIDER_SITE_OTHER): Payer: PPO | Admitting: *Deleted

## 2022-04-22 DIAGNOSIS — I4891 Unspecified atrial fibrillation: Secondary | ICD-10-CM

## 2022-04-22 DIAGNOSIS — I4892 Unspecified atrial flutter: Secondary | ICD-10-CM | POA: Diagnosis not present

## 2022-04-22 DIAGNOSIS — Z5181 Encounter for therapeutic drug level monitoring: Secondary | ICD-10-CM | POA: Diagnosis not present

## 2022-04-22 LAB — POCT INR: INR: 3.8 — AB (ref 2.0–3.0)

## 2022-04-22 NOTE — Patient Instructions (Signed)
Hold warfarin tonight then resume 1 1/2 tablet daily except 1 tablet on Sundays ?Increase green ?Recheck in 3 weeks ?Started Januvia ?

## 2022-04-23 DIAGNOSIS — E1165 Type 2 diabetes mellitus with hyperglycemia: Secondary | ICD-10-CM | POA: Diagnosis not present

## 2022-04-23 DIAGNOSIS — I10 Essential (primary) hypertension: Secondary | ICD-10-CM | POA: Diagnosis not present

## 2022-04-30 DIAGNOSIS — I2510 Atherosclerotic heart disease of native coronary artery without angina pectoris: Secondary | ICD-10-CM | POA: Diagnosis not present

## 2022-04-30 DIAGNOSIS — R600 Localized edema: Secondary | ICD-10-CM | POA: Diagnosis not present

## 2022-04-30 DIAGNOSIS — R911 Solitary pulmonary nodule: Secondary | ICD-10-CM | POA: Diagnosis not present

## 2022-04-30 DIAGNOSIS — E1165 Type 2 diabetes mellitus with hyperglycemia: Secondary | ICD-10-CM | POA: Diagnosis not present

## 2022-04-30 DIAGNOSIS — I10 Essential (primary) hypertension: Secondary | ICD-10-CM | POA: Diagnosis not present

## 2022-04-30 DIAGNOSIS — M109 Gout, unspecified: Secondary | ICD-10-CM | POA: Diagnosis not present

## 2022-04-30 DIAGNOSIS — E782 Mixed hyperlipidemia: Secondary | ICD-10-CM | POA: Diagnosis not present

## 2022-04-30 DIAGNOSIS — Z9889 Other specified postprocedural states: Secondary | ICD-10-CM | POA: Diagnosis not present

## 2022-04-30 DIAGNOSIS — Z86010 Personal history of colonic polyps: Secondary | ICD-10-CM | POA: Diagnosis not present

## 2022-04-30 DIAGNOSIS — I484 Atypical atrial flutter: Secondary | ICD-10-CM | POA: Diagnosis not present

## 2022-05-12 ENCOUNTER — Ambulatory Visit (INDEPENDENT_AMBULATORY_CARE_PROVIDER_SITE_OTHER): Payer: PPO | Admitting: *Deleted

## 2022-05-12 DIAGNOSIS — Z5181 Encounter for therapeutic drug level monitoring: Secondary | ICD-10-CM | POA: Diagnosis not present

## 2022-05-12 DIAGNOSIS — I4891 Unspecified atrial fibrillation: Secondary | ICD-10-CM | POA: Diagnosis not present

## 2022-05-12 DIAGNOSIS — I4892 Unspecified atrial flutter: Secondary | ICD-10-CM

## 2022-05-12 LAB — POCT INR: INR: 2.2 (ref 2.0–3.0)

## 2022-05-12 NOTE — Patient Instructions (Signed)
Continue warfarin 1 1/2 tablet daily except 1 tablet on Sundays Continue greens Recheck in 4 weeks Started Januvia 

## 2022-05-23 ENCOUNTER — Other Ambulatory Visit: Payer: Self-pay | Admitting: Cardiology

## 2022-05-23 NOTE — Telephone Encounter (Signed)
Received refill request for warfarin:  Last INR was 2.2 on 05/12/22 Next INR due 06/16/22 LOV was 08/06/21  Jerilynn Mages Croitoru MD  Refill approved.

## 2022-06-16 ENCOUNTER — Ambulatory Visit (INDEPENDENT_AMBULATORY_CARE_PROVIDER_SITE_OTHER): Payer: PPO | Admitting: *Deleted

## 2022-06-16 DIAGNOSIS — Z5181 Encounter for therapeutic drug level monitoring: Secondary | ICD-10-CM | POA: Diagnosis not present

## 2022-06-16 DIAGNOSIS — I4892 Unspecified atrial flutter: Secondary | ICD-10-CM | POA: Diagnosis not present

## 2022-06-16 DIAGNOSIS — I4891 Unspecified atrial fibrillation: Secondary | ICD-10-CM

## 2022-06-16 LAB — POCT INR: INR: 1.8 — AB (ref 2.0–3.0)

## 2022-07-14 ENCOUNTER — Ambulatory Visit (INDEPENDENT_AMBULATORY_CARE_PROVIDER_SITE_OTHER): Payer: PPO | Admitting: *Deleted

## 2022-07-14 DIAGNOSIS — I4891 Unspecified atrial fibrillation: Secondary | ICD-10-CM

## 2022-07-14 DIAGNOSIS — Z5181 Encounter for therapeutic drug level monitoring: Secondary | ICD-10-CM | POA: Diagnosis not present

## 2022-07-14 DIAGNOSIS — I4892 Unspecified atrial flutter: Secondary | ICD-10-CM

## 2022-07-14 LAB — POCT INR: INR: 2.2 (ref 2.0–3.0)

## 2022-07-14 NOTE — Patient Instructions (Signed)
Continue warfarin 1 1/2 tablet daily except 1 tablet on Sundays Continue greens Recheck in 4 weeks Started Januvia

## 2022-08-01 DIAGNOSIS — I10 Essential (primary) hypertension: Secondary | ICD-10-CM | POA: Diagnosis not present

## 2022-08-01 DIAGNOSIS — E1165 Type 2 diabetes mellitus with hyperglycemia: Secondary | ICD-10-CM | POA: Diagnosis not present

## 2022-08-06 DIAGNOSIS — Z0001 Encounter for general adult medical examination with abnormal findings: Secondary | ICD-10-CM | POA: Diagnosis not present

## 2022-08-06 DIAGNOSIS — I484 Atypical atrial flutter: Secondary | ICD-10-CM | POA: Diagnosis not present

## 2022-08-06 DIAGNOSIS — I10 Essential (primary) hypertension: Secondary | ICD-10-CM | POA: Diagnosis not present

## 2022-08-06 DIAGNOSIS — R911 Solitary pulmonary nodule: Secondary | ICD-10-CM | POA: Diagnosis not present

## 2022-08-06 DIAGNOSIS — R600 Localized edema: Secondary | ICD-10-CM | POA: Diagnosis not present

## 2022-08-06 DIAGNOSIS — E875 Hyperkalemia: Secondary | ICD-10-CM | POA: Diagnosis not present

## 2022-08-06 DIAGNOSIS — E1165 Type 2 diabetes mellitus with hyperglycemia: Secondary | ICD-10-CM | POA: Diagnosis not present

## 2022-08-06 DIAGNOSIS — E782 Mixed hyperlipidemia: Secondary | ICD-10-CM | POA: Diagnosis not present

## 2022-08-06 DIAGNOSIS — I2510 Atherosclerotic heart disease of native coronary artery without angina pectoris: Secondary | ICD-10-CM | POA: Diagnosis not present

## 2022-08-06 DIAGNOSIS — M109 Gout, unspecified: Secondary | ICD-10-CM | POA: Diagnosis not present

## 2022-08-06 DIAGNOSIS — Z86010 Personal history of colonic polyps: Secondary | ICD-10-CM | POA: Diagnosis not present

## 2022-08-06 DIAGNOSIS — Z9889 Other specified postprocedural states: Secondary | ICD-10-CM | POA: Diagnosis not present

## 2022-08-11 ENCOUNTER — Ambulatory Visit (INDEPENDENT_AMBULATORY_CARE_PROVIDER_SITE_OTHER): Payer: PPO | Admitting: *Deleted

## 2022-08-11 DIAGNOSIS — I4892 Unspecified atrial flutter: Secondary | ICD-10-CM | POA: Diagnosis not present

## 2022-08-11 DIAGNOSIS — I4891 Unspecified atrial fibrillation: Secondary | ICD-10-CM | POA: Diagnosis not present

## 2022-08-11 DIAGNOSIS — Z5181 Encounter for therapeutic drug level monitoring: Secondary | ICD-10-CM

## 2022-08-11 LAB — POCT INR: INR: 2.5 (ref 2.0–3.0)

## 2022-08-11 NOTE — Patient Instructions (Signed)
Continue warfarin 1 1/2 tablet daily except 1 tablet on Sundays Continue greens Recheck in 5 weeks Started Januvia

## 2022-08-19 ENCOUNTER — Ambulatory Visit: Payer: PPO | Attending: Cardiovascular Disease | Admitting: Cardiovascular Disease

## 2022-08-19 ENCOUNTER — Encounter: Payer: Self-pay | Admitting: Cardiovascular Disease

## 2022-08-19 VITALS — BP 132/89 | HR 74 | Ht 73.0 in | Wt 168.6 lb

## 2022-08-19 DIAGNOSIS — I10 Essential (primary) hypertension: Secondary | ICD-10-CM | POA: Diagnosis not present

## 2022-08-19 DIAGNOSIS — I2510 Atherosclerotic heart disease of native coronary artery without angina pectoris: Secondary | ICD-10-CM | POA: Diagnosis not present

## 2022-08-19 DIAGNOSIS — D6869 Other thrombophilia: Secondary | ICD-10-CM

## 2022-08-19 DIAGNOSIS — Z9889 Other specified postprocedural states: Secondary | ICD-10-CM

## 2022-08-19 DIAGNOSIS — E785 Hyperlipidemia, unspecified: Secondary | ICD-10-CM | POA: Diagnosis not present

## 2022-08-19 DIAGNOSIS — E119 Type 2 diabetes mellitus without complications: Secondary | ICD-10-CM | POA: Diagnosis not present

## 2022-08-19 DIAGNOSIS — I700 Atherosclerosis of aorta: Secondary | ICD-10-CM | POA: Diagnosis not present

## 2022-08-19 DIAGNOSIS — I4891 Unspecified atrial fibrillation: Secondary | ICD-10-CM

## 2022-08-19 DIAGNOSIS — I484 Atypical atrial flutter: Secondary | ICD-10-CM | POA: Diagnosis not present

## 2022-08-19 NOTE — Patient Instructions (Signed)
Medication Instructions:  No changes *If you need a refill on your cardiac medications before your next appointment, please call your pharmacy*   Lab Work: None ordered If you have labs (blood work) drawn today and your tests are completely normal, you will receive your results only by: MyChart Message (if you have MyChart) OR A paper copy in the mail If you have any lab test that is abnormal or we need to change your treatment, we will call you to review the results.   Testing/Procedures: None ordered   Follow-Up: At [facility], you and your health needs are our priority.  As part of our continuing mission to provide you with exceptional heart care, we have created designated Provider Care Teams.  These Care Teams include your primary Cardiologist (physician) and Advanced Practice Providers (APPs -  Physician Assistants and Nurse Practitioners) who all work together to provide you with the care you need, when you need it.  We recommend signing up for the patient portal called "MyChart".  Sign up information is provided on this After Visit Summary.  MyChart is used to connect with patients for Virtual Visits (Telemedicine).  Patients are able to view lab/test results, encounter notes, upcoming appointments, etc.  Non-urgent messages can be sent to your provider as well.   To learn more about what you can do with MyChart, go to [email or web address].    Your next appointment:   12 month(s)  The format for your next appointment:   In Person  Provider:   Mihai Croitoru, MD      Important Information About Sugar       

## 2022-08-19 NOTE — Progress Notes (Signed)
Cardiology Office Note    Date:  08/22/2022   ID:  TRANELL WOJTKIEWICZ, DOB Oct 07, 1951, MRN [id number]  PCP:  Celene Squibb, MD  Cardiologist:   [name], MD   Chief Complaint  Patient presents with   Irregular Heart Beat    History of Present Illness:  Jacob [name] is a 71 y.o. male who presents for follow up for atrial arrhythmia (atypical atrial flutter and atrial fibrillation) and history of MV repair (2005, 28 mm annuloplasty ring, [name]) with oversewing of the left atrial appendage.  He has a history of ablation for AV node reentry in 2005 as well.  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  He is doing well overall.  He is now walking 45 minutes every day.  His biggest complaints are palpitations which are occasional and last anywhere from 3 minutes to his longest 12 hours, but did not associate syncope or chest pain.  They may make him slightly short of breath.  He tried taking Rybelsus which caused a rapid weight gain due to severe loss of appetite.  His BMI is down to 22.  He had to stop the medication since all he could eat was popsicles.  His blood pressure was lower so he has stopped taking lisinopril.  He denies lower extremity edema, orthopnea, PND, chest pain with activity, falls or bleeding, focal neurological complaints, intermittent claudication.  He is compliant with warfarin anticoagulation and INR follow-up.  May 2021 echocardiogram showed normal left ventricular systolic function and mild increase in gradients across the mitral annulus (mean gradient 5 mmHg, heart rate 80 bpm).  The nuclear stress test performed in May  2021 showed a normal pattern of perfusion except for small apical attenuation artifact.  Despite his weight loss, he continues to have glucose levels consistent with diabetes mellitus, albeit well controlled with a hemoglobin A1c of 7.1%.  His HDL remains low at 37, but his HDL is excellent at 54 and  his triglycerides are now in normal range.  Overall his metabolic parameters are better than last year.  He has normal renal function and normal hemoglobin levels.  In the past he reported "personality changes" when he tried to take statins, this has not been an issue with rosuvastatin.  He has a history of worsening depression when on beta-blockers.  He had some problems with left flank and left lower quadrant pain.  A CT of the abdomen did not identify an etiology, but there was a lot of delay between the symptoms and the CT.  This did show bilateral small inguinal hernias with fat but no intestinal content and thickening of the wall duodenum concerning for duodenitis versus mass. The CT of the abdomen also showed substantial atherosclerosis of the abdominal aorta, without evidence of aneurysm.  In January 2016, he developed very rapid atrial tachycardia (scar related left atrial flutter?) at about 220 bpm during exercise testing. This is likely the cause of his intermittent palpitations. 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 He has been poorly tolerant to multiple beta blockers (metoprolol and carvedilol cause fatigue, nebivolol made him to "always be in a bad mood").  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 At that time his left atrial appendage was  oversewn and a patent foramen ovale was closed. Preoperative cardiac catheterization showed noncritical coronary atherosclerosis. Repeat cardiac catheterization in 2011 again showed minimal irregularities, the worst lesion being a 40% stenosis in the mid right coronary artery. He has had a couple of normal nuclear stress tests, most recently in 2009 and normal treadmill stress test most recently January 2016.   He also has a history  of hypertension and dyslipidemia and  diabetes mellitus type 2. He quit smoking in 1986.    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 He cannot wear an event monitor since he is allergic to the adhesive pads. He was intolerant to metoprolol and carvedilol (fatigue). Bystolic caused excessive irritability.  Most recent echo in June 2021 showed normal left ventricular systolic function, EF 60 to 65%, no residual mitral regurgitation and mild transmitral gradient of 5 mmHg at 80 bpm.  A nuclear stress test performed in May 2021 showed normal perfusion artifact.   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 one vessel disease,severe MR   CARDIAC CATHETERIZATION  01/19/06   normal   CARDIAC CATHETERIZATION  06/06/10   mild nonobstructive CAD   COLONOSCOPY WITH PROPOFOL N/A 09/23/2021   Procedure: COLONOSCOPY WITH PROPOFOL;  Surgeon: [name], DO;  Location: [facility];  Service: Endoscopy;  Laterality: N/A;  9:15am   cyst  1980   removed from wrist   ESOPHAGOGASTRODUODENOSCOPY (EGD) WITH PROPOFOL N/A 09/23/2021   Procedure: ESOPHAGOGASTRODUODENOSCOPY (EGD) WITH  PROPOFOL;  Surgeon: [name], DO;  Location[facility];  Service: Endoscopy;  Laterality: N/A;   MITRAL VALVE SURGERY  12/11/04   MV repair with Oletta Lamas ring,closure of PFO   POLYPECTOMY  09/23/2021   Procedure: POLYPECTOMY;  Surgeon: [name], DO;  Location: [facility];  Service: Endoscopy;;   RADIOFREQUENCY ABLATION  12/02/04   Dr. Emelda Brothers   SALIVARY GLAND SURGERY  2004   growth   TONSILLECTOMY  1959    Outpatient Medications Prior to Visit  Medication Sig Dispense Refill   acetaminophen (TYLENOL) 500 MG tablet Take 1,000 mg by mouth every 6 (six) hours as needed for mild pain or headache.     allopurinol (ZYLOPRIM) 300 MG tablet TAKE 1 TABLET BY MOUTH ONCE A DAY. 90 tablet 0   cholecalciferol (VITAMIN D3) 25 MCG (1000 UNIT) tablet Take 1,000 Units by mouth daily.     metFORMIN (GLUCOPHAGE) 1000 MG tablet Take 1,000 mg by mouth 2 (two) times daily.     rosuvastatin (CRESTOR) 20 MG tablet Take 20 mg by mouth daily.  sitaGLIPtin (JANUVIA) 100 MG tablet Take 100 mg by mouth daily.     torsemide (DEMADEX) 20 MG tablet Take 1-2 tablets (20-40 mg total) by mouth daily. (Patient taking differently: Take 20 mg by mouth daily.) 90 tablet 1   warfarin (COUMADIN) 5 MG tablet TAKE 1 1/2 TABLET BY MOUTH ONCE DAILY OR AS DIRECTED 50 tablet 5   rosuvastatin (CRESTOR) 10 MG tablet Take 1 tablet (10 mg total) by mouth daily. (Patient taking differently: Take 20 mg by mouth daily.) 90 tablet 3   lisinopril (ZESTRIL) 20 MG tablet Take 1 tablet (20 mg total) by mouth daily. (Patient not taking: Reported on 08/19/2022) 90 tablet 3   metFORMIN (GLUCOPHAGE-XR) 500 MG 24 hr tablet Take 1,000 mg by mouth 2 (two) times daily. (Patient not taking: Reported on 08/19/2022)     pioglitazone (ACTOS) 15 MG tablet Take 15 mg by mouth daily. (Patient not taking: Reported on 08/19/2022)     No facility-administered medications prior to visit.     Allergies:   Atenolol, Cardizem [diltiazem  hcl], Melatonin, Metoprolol, and Clindamycin/lincomycin   Social History   Socioeconomic History   Marital status: Married    Spouse name: Not on file   Number of children: Not on file   Years of education: Not on file   Highest education level: Not on file  Occupational History   Not on file  Tobacco Use   Smoking status: Former   Smokeless tobacco: Never  Vaping Use   Vaping Use: Never used  Substance and Sexual Activity   Alcohol use: No   Drug use: No   Sexual activity: Not on file  Other Topics Concern   Not on file  Social History Narrative   Not on file   Social Determinants of Health   Financial Resource Strain: Not on file  Food Insecurity: Not on file  Transportation Needs: Not on file  Physical Activity: Not on file  Stress: Not on file  Social Connections: Not on file     Family History:  The patient's family history includes Cancer in his mother; Glaucoma in his mother.   ROS:   Please see the history of present illness.    ROS All other systems reviewed and are negative.   PHYSICAL EXAM:   VS:  BP 132/89 (BP Location: Left Arm, Patient Position: Sitting, Cuff Size: Normal)   Pulse 74   Ht '6\' 1"'$  (1.854 m)   Wt 168 lb 9.6 oz (76.5 kg) Comment: Patient boots weigh 4 lbs  SpO2 99%   BMI 22.24 kg/m      General: Alert, oriented x3, no distress, very lean Head: no evidence of trauma, PERRL, EOMI, no exophtalmos or lid lag, no myxedema, no xanthelasma; normal ears, nose and oropharynx Neck: normal jugular venous pulsations and no hepatojugular reflux; brisk carotid pulses without delay and no carotid bruits Chest: clear to auscultation, no signs of consolidation by percussion or palpation, normal fremitus, symmetrical and full respiratory excursions Cardiovascular: normal position and quality of the apical impulse, regular rhythm, normal first and second heart sounds, no murmurs, rubs or gallops Abdomen: no tenderness or distention, no masses by  palpation, no abnormal pulsatility or arterial bruits, normal bowel sounds, no hepatosplenomegaly Extremities: no clubbing, cyanosis or edema; 2+ radial, ulnar and brachial pulses bilaterally; 2+ right femoral, posterior tibial and dorsalis pedis pulses; 2+ left femoral, posterior tibial and dorsalis pedis pulses; no subclavian or femoral bruits Neurological: grossly nonfocal Psych: Normal mood and affect  Wt Readings from Last 3 Encounters:  08/19/22 168 lb 9.6 oz (76.5 kg)  09/18/21 168 lb (76.2 kg)  08/28/21 171 lb 6.4 oz (77.7 kg)      Studies/Labs Reviewed:   EKG: EKG is ordered today and shows normal sinus rhythm, completely normal tracing.  ECHO 05/24/2020   1. Left ventricular ejection fraction, by estimation, is 60 to 65%. The left ventricle has normal function. The left ventricle has no regional wall motion abnormalities. Left ventricular diastolic parameters are indeterminate.   2. Right ventricular systolic function is normal. The right ventricular size is normal. There is normal pulmonary artery systolic pressure.   3. 28 mm Edwards physio angioplasty ring MV repair is present. Mild mean gradient across the valve of 5 mmHg. . The mitral valve has been repaired/replaced. No evidence of mitral valve regurgitation.   4. The aortic valve has an indeterminant number of cusps. Aortic valve regurgitation is not visualized. No aortic stenosis is present.   5. The inferior vena cava is normal in size with greater than 50% respiratory variability, suggesting right atrial pressure of 3 mmHg.   BMET    Component Value Date/Time   NA 141 09/18/2021 1145   NA 141 03/19/2020 0807   K 4.0 09/18/2021 1145   CL 101 09/18/2021 1145   CO2 31 09/18/2021 1145   GLUCOSE 146 (H) 09/18/2021 1145   BUN 23 09/18/2021 1145   BUN 21 03/19/2020 0807   CREATININE 0.90 09/18/2021 1145   CALCIUM 9.6 09/18/2021 1145   GFRNONAA >60 09/18/2021 1145   GFRAA 72 03/19/2020 0807     Lipid Panel      Component Value Date/Time   CHOL 156 03/19/2020 0807   TRIG 116 03/19/2020 0807   HDL 31 (L) 03/19/2020 0807   CHOLHDL 5.0 03/19/2020 0807   CHOLHDL 4.7 01/27/2019 0832   VLDL 15 01/27/2019 0832   LDLCALC 104 (H) 03/19/2020 0807   LABVLDL 21 03/19/2020 0807   07/12/2021  Cholesterol 178, HDL 33, LDL 122, triglycerides 128  Hemoglobin 13.4, creatinine 0.95, hemoglobin A1c 7.0%  ASSESSMENT:    1. Atypical atrial flutter ([place])   2. Acquired thrombophilia ([place])   3. History of cardiac radiofrequency ablation (RFA)   4. History of mitral valve repair   5. Essential hypertension   6. Diabetes mellitus type 2 in nonobese (H[facility])   7. Coronary artery disease involving native coronary artery of native heart without angina pectoris   8. Atherosclerosis of aorta ([place])   9. Dyslipidemia (high LDL; low HDL)      PLAN:  In order of problems listed above:   Atypical atrial flutter: He has not tolerated treatment with either beta-blockers (both atenolol and metoprolol were tried) or diltiazem.  See treadmill stress test ECG tracings from 2017. Most likely has scar reentry flutter in the left atrium.  We have never documented atrial fibrillation. EP consultation was performed in 2019 but Mr. Litsey felt that his symptoms did not justify either antiarrhythmics or ablation.  CHADSVasc 3 (Age, HTN, DM).  Encouraged him to purchase a inexpensive outpatient electronic rhythm monitoring device, such as Kardia. Anticoagulation: on warfarin. He did have his left atrial appendage oversewn; he should be at lower embolic risk.  Threshold for discontinuation of anticoagulants is low if he should have bleeding complications. 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 HTN: Well-controlled DM: Persists  despite the fact that he is remarkably lean.  Congratulated him on increased levels of physical activity.  Did not tolerate Rybelsus due to excessive reduction in food intake.  Current control is good (if cost were not a consideration, consider SGLT2 inhibitor as the next step if he requires more medications). CAD: He has never complained of angina pectoris.  He does have some history of coronary atherosclerosis by angiography.  Moderate stenosis of RCA (40%) at last cardiac catheterization.  Low risk nuclear stress test May 2021.   HLP:  low HDL despite being very lean and exercising daily, consistent with metabolic syndrome/insulin resistance.  Ideally would keep at target LDL less than 70.  He cannot afford PCSK9 inhibitors.   Medication Adjustments/Labs and Tests Ordered: Current medicines are reviewed at length with the patient today.  Concerns regarding medicines are outlined above.  Medication changes, Labs and Tests ordered today are listed in the Patient Instructions below. Patient Instructions  Medication Instructions:  No changes *If you need a refill on your cardiac medications before your next appointment, please call your pharmacy*   Lab Work: None ordered If you have labs (blood work) drawn today and your tests are completely normal, you will receive your results only by: [place] (if you have MyChart) OR A paper copy in the mail If you have any lab test that is abnormal or we need to change your treatment, we will call you to review the results.   Testing/Procedures: None ordered   Follow-Up: At [facility], you and your health needs are our priority.  As part of our continuing mission to provide you with exceptional heart care, we have created designated Provider Care Teams.  These Care Teams include your primary Cardiologist (physician) and Advanced Practice Providers (APPs -  Physician Assistants and Nurse Practitioners) who all work together to provide  you with the care you need, when you need it.  We recommend signing up for the patient portal called "MyChart".  Sign up information is provided on this After Visit Summary.  MyChart is used to connect with patients for Virtual Visits (Telemedicine).  Patients are able to view lab/test results, encounter notes, upcoming appointments, etc.  Non-urgent messages can be sent to your provider as well.   To learn more about what you can do with MyChart, go to [email or web address].    Your next appointment:   12 month(s)  The format for your next appointment:   In Person  Provider:   [name], MD     Important Information About Sugar           Signed, [name], MD  08/22/2022 9:31 AM    [address], [place], [place]  [address] Phone: [phone]; Fax: [phone]

## 2022-08-22 ENCOUNTER — Encounter: Payer: Self-pay | Admitting: Cardiovascular Disease

## 2022-09-19 ENCOUNTER — Ambulatory Visit: Payer: PPO | Attending: Cardiology | Admitting: *Deleted

## 2022-09-19 DIAGNOSIS — I4891 Unspecified atrial fibrillation: Secondary | ICD-10-CM | POA: Diagnosis not present

## 2022-09-19 DIAGNOSIS — Z5181 Encounter for therapeutic drug level monitoring: Secondary | ICD-10-CM

## 2022-09-19 DIAGNOSIS — I4892 Unspecified atrial flutter: Secondary | ICD-10-CM

## 2022-09-19 LAB — POCT INR: INR: 1.5 — AB (ref 2.0–3.0)

## 2022-09-19 NOTE — Patient Instructions (Signed)
Take warfarin 2 1/2 tablets tonight and tomorrow night then resume 1 1/2 tablet daily except 1 tablet on Sundays Continue greens Recheck in 2 weeks Started Januvia Has been drinking Glucerna (Equate brand) 1 bottle daily.  Will stop until next office visit to see if INR goes back up to normal.

## 2022-10-01 ENCOUNTER — Ambulatory Visit: Payer: PPO | Attending: Cardiology | Admitting: *Deleted

## 2022-10-01 DIAGNOSIS — I4891 Unspecified atrial fibrillation: Secondary | ICD-10-CM | POA: Diagnosis not present

## 2022-10-01 DIAGNOSIS — Z5181 Encounter for therapeutic drug level monitoring: Secondary | ICD-10-CM | POA: Diagnosis not present

## 2022-10-01 DIAGNOSIS — I4892 Unspecified atrial flutter: Secondary | ICD-10-CM | POA: Diagnosis not present

## 2022-10-01 LAB — POCT INR: INR: 2.8 (ref 2.0–3.0)

## 2022-10-01 NOTE — Patient Instructions (Signed)
Continue warfarin 1 1/2 tablet daily except 1 tablet on Sundays Continue greens Recheck in 4 weeks Started Januvia INR back to normal after stopping Glucerna

## 2022-10-29 ENCOUNTER — Ambulatory Visit: Payer: PPO | Attending: Cardiology | Admitting: *Deleted

## 2022-10-29 DIAGNOSIS — I4891 Unspecified atrial fibrillation: Secondary | ICD-10-CM

## 2022-10-29 DIAGNOSIS — Z5181 Encounter for therapeutic drug level monitoring: Secondary | ICD-10-CM | POA: Diagnosis not present

## 2022-10-29 DIAGNOSIS — I4892 Unspecified atrial flutter: Secondary | ICD-10-CM

## 2022-10-29 LAB — POCT INR: INR: 3.4 — AB (ref 2.0–3.0)

## 2022-10-29 NOTE — Patient Instructions (Signed)
Hold warfarin tonight then resume 1 1/2 tablet daily except 1 tablet on Sundays Increase greens Recheck in 3 weeks

## 2022-11-19 ENCOUNTER — Ambulatory Visit: Payer: PPO | Attending: Cardiology | Admitting: *Deleted

## 2022-11-19 DIAGNOSIS — I4891 Unspecified atrial fibrillation: Secondary | ICD-10-CM

## 2022-11-19 DIAGNOSIS — I4892 Unspecified atrial flutter: Secondary | ICD-10-CM

## 2022-11-19 DIAGNOSIS — Z5181 Encounter for therapeutic drug level monitoring: Secondary | ICD-10-CM

## 2022-11-19 LAB — POCT INR: INR: 3.3 — AB (ref 2.0–3.0)

## 2022-11-19 NOTE — Patient Instructions (Signed)
Take warfarin 1 tablet tonight then decrease dose to 1 1/2 tablet daily except 1 tablet on Sundays, Tuesdays and Thursdays Continue greens Recheck in 3 weeks

## 2022-12-04 DIAGNOSIS — E1165 Type 2 diabetes mellitus with hyperglycemia: Secondary | ICD-10-CM | POA: Diagnosis not present

## 2022-12-04 DIAGNOSIS — I10 Essential (primary) hypertension: Secondary | ICD-10-CM | POA: Diagnosis not present

## 2022-12-08 DIAGNOSIS — I10 Essential (primary) hypertension: Secondary | ICD-10-CM | POA: Diagnosis not present

## 2022-12-08 DIAGNOSIS — D6869 Other thrombophilia: Secondary | ICD-10-CM | POA: Diagnosis not present

## 2022-12-08 DIAGNOSIS — M109 Gout, unspecified: Secondary | ICD-10-CM | POA: Diagnosis not present

## 2022-12-08 DIAGNOSIS — R911 Solitary pulmonary nodule: Secondary | ICD-10-CM | POA: Diagnosis not present

## 2022-12-08 DIAGNOSIS — E1169 Type 2 diabetes mellitus with other specified complication: Secondary | ICD-10-CM | POA: Diagnosis not present

## 2022-12-08 DIAGNOSIS — Z9889 Other specified postprocedural states: Secondary | ICD-10-CM | POA: Diagnosis not present

## 2022-12-08 DIAGNOSIS — I484 Atypical atrial flutter: Secondary | ICD-10-CM | POA: Diagnosis not present

## 2022-12-08 DIAGNOSIS — Z86010 Personal history of colonic polyps: Secondary | ICD-10-CM | POA: Diagnosis not present

## 2022-12-08 DIAGNOSIS — I2510 Atherosclerotic heart disease of native coronary artery without angina pectoris: Secondary | ICD-10-CM | POA: Diagnosis not present

## 2022-12-08 DIAGNOSIS — R600 Localized edema: Secondary | ICD-10-CM | POA: Diagnosis not present

## 2022-12-08 DIAGNOSIS — I700 Atherosclerosis of aorta: Secondary | ICD-10-CM | POA: Diagnosis not present

## 2022-12-08 DIAGNOSIS — E782 Mixed hyperlipidemia: Secondary | ICD-10-CM | POA: Diagnosis not present

## 2022-12-11 ENCOUNTER — Ambulatory Visit: Payer: PPO | Attending: Cardiology | Admitting: *Deleted

## 2022-12-11 DIAGNOSIS — Z5181 Encounter for therapeutic drug level monitoring: Secondary | ICD-10-CM | POA: Diagnosis not present

## 2022-12-11 DIAGNOSIS — I4891 Unspecified atrial fibrillation: Secondary | ICD-10-CM

## 2022-12-11 DIAGNOSIS — I4892 Unspecified atrial flutter: Secondary | ICD-10-CM | POA: Diagnosis not present

## 2022-12-11 LAB — POCT INR: POC INR: 2.5

## 2022-12-11 NOTE — Patient Instructions (Signed)
Description   Continue to take 1 1/2 tablet daily except 1 tablet on Sundays, Tuesdays and Thursdays Continue greens Recheck in 4 weeks

## 2022-12-24 DIAGNOSIS — H0102B Squamous blepharitis left eye, upper and lower eyelids: Secondary | ICD-10-CM | POA: Diagnosis not present

## 2022-12-24 DIAGNOSIS — H40013 Open angle with borderline findings, low risk, bilateral: Secondary | ICD-10-CM | POA: Diagnosis not present

## 2022-12-24 DIAGNOSIS — H02834 Dermatochalasis of left upper eyelid: Secondary | ICD-10-CM | POA: Diagnosis not present

## 2022-12-24 DIAGNOSIS — H33321 Round hole, right eye: Secondary | ICD-10-CM | POA: Diagnosis not present

## 2022-12-24 DIAGNOSIS — H43821 Vitreomacular adhesion, right eye: Secondary | ICD-10-CM | POA: Diagnosis not present

## 2022-12-24 DIAGNOSIS — H04123 Dry eye syndrome of bilateral lacrimal glands: Secondary | ICD-10-CM | POA: Diagnosis not present

## 2022-12-24 DIAGNOSIS — E119 Type 2 diabetes mellitus without complications: Secondary | ICD-10-CM | POA: Diagnosis not present

## 2022-12-24 DIAGNOSIS — H0102A Squamous blepharitis right eye, upper and lower eyelids: Secondary | ICD-10-CM | POA: Diagnosis not present

## 2022-12-24 DIAGNOSIS — H02831 Dermatochalasis of right upper eyelid: Secondary | ICD-10-CM | POA: Diagnosis not present

## 2022-12-24 DIAGNOSIS — H2513 Age-related nuclear cataract, bilateral: Secondary | ICD-10-CM | POA: Diagnosis not present

## 2023-01-08 ENCOUNTER — Ambulatory Visit: Payer: PPO | Attending: Cardiology | Admitting: *Deleted

## 2023-01-08 DIAGNOSIS — I4891 Unspecified atrial fibrillation: Secondary | ICD-10-CM

## 2023-01-08 DIAGNOSIS — Z5181 Encounter for therapeutic drug level monitoring: Secondary | ICD-10-CM

## 2023-01-08 DIAGNOSIS — I4892 Unspecified atrial flutter: Secondary | ICD-10-CM

## 2023-01-08 LAB — POCT INR: INR: 1.7 — AB (ref 2.0–3.0)

## 2023-01-08 NOTE — Patient Instructions (Signed)
Take warfarin 2 tablets tonight then resume 1 1/2 tablet daily except 1 tablet on Sundays, Tuesdays and Thursdays Hold greens x 1 week then restart greens Recheck in 3 weeks

## 2023-01-29 ENCOUNTER — Ambulatory Visit: Payer: PPO | Attending: Cardiology | Admitting: *Deleted

## 2023-01-29 DIAGNOSIS — Z5181 Encounter for therapeutic drug level monitoring: Secondary | ICD-10-CM

## 2023-01-29 DIAGNOSIS — I4892 Unspecified atrial flutter: Secondary | ICD-10-CM | POA: Diagnosis not present

## 2023-01-29 DIAGNOSIS — I4891 Unspecified atrial fibrillation: Secondary | ICD-10-CM

## 2023-01-29 LAB — POCT INR: INR: 2.3 (ref 2.0–3.0)

## 2023-01-29 NOTE — Patient Instructions (Signed)
Continue warfarin 1 1/2 tablet daily except 1 tablet on Sundays, Tuesdays and Thursdays Continue greens Recheck in 4 weeks

## 2023-01-31 DIAGNOSIS — M6281 Muscle weakness (generalized): Secondary | ICD-10-CM | POA: Diagnosis not present

## 2023-02-02 DIAGNOSIS — M6281 Muscle weakness (generalized): Secondary | ICD-10-CM | POA: Diagnosis not present

## 2023-02-05 ENCOUNTER — Other Ambulatory Visit: Payer: Self-pay | Admitting: Cardiovascular Disease

## 2023-02-05 NOTE — Telephone Encounter (Signed)
Refill request for warfarin:  Last INR was 2.3 on 01/29/23 Next INR due 02/26/23 LOV was 08/19/22  Refill approved.

## 2023-02-26 ENCOUNTER — Ambulatory Visit: Payer: PPO | Attending: Internal Medicine | Admitting: *Deleted

## 2023-02-26 DIAGNOSIS — Z5181 Encounter for therapeutic drug level monitoring: Secondary | ICD-10-CM

## 2023-02-26 DIAGNOSIS — I4891 Unspecified atrial fibrillation: Secondary | ICD-10-CM | POA: Diagnosis not present

## 2023-02-26 DIAGNOSIS — I4892 Unspecified atrial flutter: Secondary | ICD-10-CM | POA: Diagnosis not present

## 2023-02-26 LAB — POCT INR: INR: 1.5 — AB (ref 2.0–3.0)

## 2023-02-26 NOTE — Patient Instructions (Signed)
Increase warfarin to 1 1/2 tablets daily Continue greens Recheck in 2 weeks

## 2023-03-11 ENCOUNTER — Ambulatory Visit: Payer: PPO | Attending: Cardiology

## 2023-03-11 DIAGNOSIS — Z5181 Encounter for therapeutic drug level monitoring: Secondary | ICD-10-CM

## 2023-03-11 DIAGNOSIS — I4891 Unspecified atrial fibrillation: Secondary | ICD-10-CM | POA: Diagnosis not present

## 2023-03-11 DIAGNOSIS — I4892 Unspecified atrial flutter: Secondary | ICD-10-CM

## 2023-03-11 DIAGNOSIS — I484 Atypical atrial flutter: Secondary | ICD-10-CM | POA: Diagnosis not present

## 2023-03-11 LAB — POCT INR: INR: 1.7 — AB (ref 2.0–3.0)

## 2023-03-11 NOTE — Patient Instructions (Signed)
Description   Increase warfarin dosage to 1.5 tablets daily except 2 tablets on Wednesdays. Recheck in 2 weeks.

## 2023-03-25 ENCOUNTER — Ambulatory Visit: Payer: PPO | Attending: Cardiology | Admitting: *Deleted

## 2023-03-25 DIAGNOSIS — Z5181 Encounter for therapeutic drug level monitoring: Secondary | ICD-10-CM

## 2023-03-25 DIAGNOSIS — I4892 Unspecified atrial flutter: Secondary | ICD-10-CM

## 2023-03-25 DIAGNOSIS — I4891 Unspecified atrial fibrillation: Secondary | ICD-10-CM | POA: Diagnosis not present

## 2023-03-25 LAB — POCT INR: INR: 2.8 (ref 2.0–3.0)

## 2023-03-25 NOTE — Patient Instructions (Signed)
Continue warfarin 1.5 tablets daily except 2 tablets on Wednesdays. Recheck in 3 weeks.

## 2023-03-26 ENCOUNTER — Telehealth: Payer: Self-pay | Admitting: Cardiovascular Disease

## 2023-03-26 ENCOUNTER — Telehealth: Payer: Self-pay | Admitting: Emergency Medicine

## 2023-03-26 DIAGNOSIS — C021 Malignant neoplasm of border of tongue: Secondary | ICD-10-CM | POA: Diagnosis not present

## 2023-03-26 NOTE — Telephone Encounter (Signed)
Caller stated they want to get results of the patient's INR visit on 4/3.

## 2023-03-26 NOTE — Telephone Encounter (Signed)
LMOM to return call.

## 2023-03-26 NOTE — Telephone Encounter (Signed)
Vickie from [facility] calling for INR results. Results given over the phone and faxed to office Fax- [phone]

## 2023-04-03 DIAGNOSIS — I10 Essential (primary) hypertension: Secondary | ICD-10-CM | POA: Diagnosis not present

## 2023-04-03 DIAGNOSIS — E1169 Type 2 diabetes mellitus with other specified complication: Secondary | ICD-10-CM | POA: Diagnosis not present

## 2023-04-03 DIAGNOSIS — Z125 Encounter for screening for malignant neoplasm of prostate: Secondary | ICD-10-CM | POA: Diagnosis not present

## 2023-04-07 ENCOUNTER — Inpatient Hospital Stay: Payer: PPO | Attending: Physician Assistant

## 2023-04-07 ENCOUNTER — Other Ambulatory Visit: Payer: Self-pay

## 2023-04-07 ENCOUNTER — Telehealth: Payer: Self-pay

## 2023-04-07 ENCOUNTER — Encounter: Payer: Self-pay | Admitting: Physician Assistant

## 2023-04-07 ENCOUNTER — Inpatient Hospital Stay (HOSPITAL_BASED_OUTPATIENT_CLINIC_OR_DEPARTMENT_OTHER): Payer: PPO | Admitting: Physician Assistant

## 2023-04-07 VITALS — BP 131/93 | HR 90 | Temp 97.3°F | Resp 17 | Wt 165.3 lb

## 2023-04-07 DIAGNOSIS — Z87891 Personal history of nicotine dependence: Secondary | ICD-10-CM | POA: Diagnosis not present

## 2023-04-07 DIAGNOSIS — R5383 Other fatigue: Secondary | ICD-10-CM | POA: Diagnosis not present

## 2023-04-07 DIAGNOSIS — C029 Malignant neoplasm of tongue, unspecified: Secondary | ICD-10-CM | POA: Insufficient documentation

## 2023-04-07 DIAGNOSIS — K148 Other diseases of tongue: Secondary | ICD-10-CM

## 2023-04-07 LAB — CBC WITH DIFFERENTIAL (CANCER CENTER ONLY)
Abs Immature Granulocytes: 0.01 10*3/uL (ref 0.00–0.07)
Basophils Absolute: 0 10*3/uL (ref 0.0–0.1)
Basophils Relative: 1 %
Eosinophils Absolute: 0.1 10*3/uL (ref 0.0–0.5)
Eosinophils Relative: 1 %
HCT: 42.9 % (ref 39.0–52.0)
Hemoglobin: 15 g/dL (ref 13.0–17.0)
Immature Granulocytes: 0 %
Lymphocytes Relative: 25 %
Lymphs Abs: 1.7 10*3/uL (ref 0.7–4.0)
MCH: 30.1 pg (ref 26.0–34.0)
MCHC: 35 g/dL (ref 30.0–36.0)
MCV: 86.1 fL (ref 80.0–100.0)
Monocytes Absolute: 0.6 10*3/uL (ref 0.1–1.0)
Monocytes Relative: 9 %
Neutro Abs: 4.3 10*3/uL (ref 1.7–7.7)
Neutrophils Relative %: 64 %
Platelet Count: 180 10*3/uL (ref 150–400)
RBC: 4.98 MIL/uL (ref 4.22–5.81)
RDW: 13.6 % (ref 11.5–15.5)
WBC Count: 6.8 10*3/uL (ref 4.0–10.5)
nRBC: 0 % (ref 0.0–0.2)

## 2023-04-07 LAB — CMP (CANCER CENTER ONLY)
ALT: 10 U/L (ref 0–44)
AST: 12 U/L — ABNORMAL LOW (ref 15–41)
Albumin: 4.9 g/dL (ref 3.5–5.0)
Alkaline Phosphatase: 39 U/L (ref 38–126)
Anion gap: 7 (ref 5–15)
BUN: 33 mg/dL — ABNORMAL HIGH (ref 8–23)
CO2: 30 mmol/L (ref 22–32)
Calcium: 10.6 mg/dL — ABNORMAL HIGH (ref 8.9–10.3)
Chloride: 104 mmol/L (ref 98–111)
Creatinine: 1.18 mg/dL (ref 0.61–1.24)
GFR, Estimated: >60 mL/min (ref >60)
Glucose, Bld: 114 mg/dL — ABNORMAL HIGH (ref 70–99)
Potassium: 4.6 mmol/L (ref 3.5–5.1)
Sodium: 141 mmol/L (ref 135–145)
Total Bilirubin: 0.5 mg/dL (ref 0.3–1.2)
Total Protein: 7.9 g/dL (ref 6.5–8.1)

## 2023-04-07 LAB — TSH: TSH: 0.9 u[IU]/mL (ref 0.350–4.500)

## 2023-04-07 NOTE — Telephone Encounter (Signed)
Pt advised with VU 

## 2023-04-07 NOTE — Progress Notes (Unsigned)
[facility] [phone]   Fax:([phone]) [phone]  INITIAL CONSULTATION:  Patient Care Team: Benita Stabile, MD as PCP - General (Internal Medicine) Croitoru, Rachelle Hora, MD as PCP - Cardiology (Cardiology) Lanelle Bal, DO as Consulting Physician (Gastroenterology)  CHIEF COMPLAINTS/PURPOSE OF CONSULTATION:  Squamous cell carcinoma of tongue  HISTORY OF PRESENTING ILLNESS:  Jacob [name] 72 y.o. 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   On review of the previous records, Jacob [name] was evaluated by a oral and maxillofacial surgeon on March 28th, 2024 to evaluate a lesion on the left lateral border of his tongue that was noted in March 2024. 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   On exam today, Jacob [name] reports that the lesion on his tongue is painful but do not bleed or interfere with his ability to eat.  He is otherwise feeling well.  He does have chronic fatigue but contributes this to his poor sleeping habit for the last 2 years.  He continues to complete all his ADLs on his own.  He has a good appetite and denies any recent weight changes.  He denies nausea, vomiting or abdominal pain.  His bowel habits are unchanged without recurrent episodes of diarrhea or constipation.  He denies easy bruising or signs of active bleeding.  He denies fevers, chills, night sweats, shortness of breath, chest pain, cough, palpable lymph nodes, peripheral edema or neuropathy.  He has no other complaints.  Rest of the 10 point ROS is below.  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 one vessel disease,severe MR   CARDIAC CATHETERIZATION  01/19/06   normal   CARDIAC CATHETERIZATION  06/06/10   mild nonobstructive CAD   COLONOSCOPY WITH PROPOFOL N/A 09/23/2021   Procedure: COLONOSCOPY WITH PROPOFOL;  Surgeon: Lanelle Bal, DO;  Location[facility];  Service: Endoscopy;  Laterality: N/A;  9:15am   cyst  1980   removed from wrist   ESOPHAGOGASTRODUODENOSCOPY (EGD) WITH PROPOFOL N/A 09/23/2021   Procedure: ESOPHAGOGASTRODUODENOSCOPY (EGD) WITH PROPOFOL;  Surgeon: Lanelle Bal, DO;  [facility];  Service: Endoscopy;  Laterality: N/A;   MITRAL VALVE SURGERY  12/11/04   MV repair with Randa Evens ring,closure of PFO   POLYPECTOMY  09/23/2021   Procedure: POLYPECTOMY;  Surgeon: Lanelle Bal, DO;  [facility];  Service: Endoscopy;;   RADIOFREQUENCY ABLATION  12/02/04   Dr. Monna Fam   SALIVARY GLAND SURGERY  2004   growth   TONSILLECTOMY  1959    SOCIAL HISTORY: Social History   Socioeconomic History   Marital status: Married    Spouse name: Not on file   Number of children: Not on file   Years of education: Not on file   Highest education level: Not on file  Occupational History   Not on file  Tobacco Use   Smoking status: Former   Smokeless tobacco: Never  Vaping Use   Vaping Use: Never used  Substance  and Sexual Activity   Alcohol use: No   Drug use: No   Sexual activity: Not on file  Other Topics Concern   Not on file  Social History Narrative   Not on file   Social Determinants of Health   Financial Resource Strain: Not on file  Food Insecurity: Not on file  Transportation Needs: Not on file  Physical Activity: Not on file  Stress: Not on file  Social Connections: Not on file  Intimate Partner Violence: Not on file    FAMILY HISTORY: Family History  Problem Relation Age of Onset   Cancer Mother        non-hodgkins lymphoma   Glaucoma Mother     ALLERGIES:  is allergic to atenolol, cardizem [diltiazem hcl], melatonin, metoprolol, and clindamycin/lincomycin.  MEDICATIONS:  Current Outpatient Medications  Medication Sig Dispense Refill   acetaminophen (TYLENOL) 500 MG tablet Take 1,000 mg by mouth every 6 (six) hours as needed for mild pain or headache.     allopurinol (ZYLOPRIM) 300 MG tablet TAKE 1 TABLET BY MOUTH ONCE A DAY. 90 tablet 0   cholecalciferol (VITAMIN D3) 25 MCG (1000 UNIT) tablet Take 1,000 Units by mouth daily.     metFORMIN (GLUCOPHAGE) 1000 MG tablet Take 1,000 mg by mouth daily with breakfast.     rosuvastatin (CRESTOR) 20 MG tablet Take 20 mg by mouth daily.     sitaGLIPtin (JANUVIA) 100 MG tablet Take 100 mg by mouth daily.     torsemide (DEMADEX) 20 MG tablet Take 1-2 tablets (20-40 mg total) by mouth daily. (Patient taking differently: Take 20 mg by mouth daily.) 90 tablet 1   warfarin (COUMADIN) 5 MG tablet TAKE (1 & 1/2) TABLET BY MOUTH ONCE DAILY OR AS DIRECTED 50 tablet 5   No current facility-administered medications for this visit.    REVIEW OF SYSTEMS:   Constitutional: ( - ) fevers, ( - )  chills , ( - ) night sweats Eyes: ( - ) blurriness of vision, ( - ) double vision, ( - ) watery eyes Ears, nose, mouth, throat, and face: ( - ) mucositis, ( - ) sore throat Respiratory: ( - ) cough, ( - ) dyspnea, ( - ) wheezes Cardiovascular: ( -  ) palpitation, ( - ) chest discomfort, ( - ) lower extremity swelling Gastrointestinal:  ( - ) nausea, ( - ) heartburn, ( - ) change in bowel habits Skin: ( - ) abnormal skin rashes Lymphatics: ( - ) new lymphadenopathy, ( - ) easy bruising Neurological: ( - ) numbness, ( - ) tingling, ( - ) new weaknesses Behavioral/Psych: ( - ) mood change, ( - ) new changes  All other systems were reviewed with the patient and are negative.  PHYSICAL EXAMINATION: ECOG PERFORMANCE STATUS: 1 - Symptomatic but completely ambulatory  Vitals:   04/07/23 1010  BP: (!) 131/93  Pulse: 90  Resp: 17  Temp: (!) 97.3 F (36.3 C)  SpO2: 100%  Filed Weights   04/07/23 1010  Weight: 165 lb 4.8 oz (75 kg)    GENERAL: well appearing male in NAD  SKIN: skin color, texture, turgor are normal, no rashes or significant lesions EYES: conjunctiva are pink and non-injected, sclera clear OROPHARYNX: no exudate, no erythema; lips, buccal mucosa. Exophytic lesion on left lateral region of tongue without ulceration or bleeding.  NECK: supple, non-tender LYMPH:  no palpable lymphadenopathy in the cervical or supraclavicular lymph nodes.  LUNGS: clear to auscultation and percussion with normal breathing effort HEART: regular rate & rhythm and no murmurs and no lower extremity edema Musculoskeletal: no cyanosis of digits and no clubbing  PSYCH: alert & oriented x 3, fluent speech NEURO: no focal motor/sensory deficits  LABORATORY DATA:  I have reviewed the data as listed    Latest Ref Rng & Units 04/30/2021    6:51 AM 03/19/2020    8:07 AM 05/27/2019   12:00 AM  CBC  WBC 4.0 - 10.5 K/uL 4.4  6.5  5.1      Hemoglobin 13.0 - 17.0 g/dL 78.2  95.6  21.3      Hematocrit 39.0 - 52.0 % 43.3  43.1  40      Platelets 150 - 400 K/uL 184  156  172         This result is from an external source.       Latest Ref Rng & Units 09/18/2021   11:45 AM 04/30/2021    6:51 AM 03/19/2020    8:07 AM  CMP  Glucose 70 - 99 mg/dL 086   578  469   BUN 8 - 23 mg/dL 23  25  21    Creatinine 0.61 - 1.24 mg/dL 6.29  5.28  4.13   Sodium 135 - 145 mmol/L 141  139  141   Potassium 3.5 - 5.1 mmol/L 4.0  3.7  4.7   Chloride 98 - 111 mmol/L 101  100  101   CO2 22 - 32 mmol/L 31  30  26    Calcium 8.9 - 10.3 mg/dL 9.6  9.4  9.6   Total Protein 6.5 - 8.1 g/dL  7.9  7.1   Total Bilirubin 0.3 - 1.2 mg/dL  0.6  0.4   Alkaline Phos 38 - 126 U/L  52  76   AST 15 - 41 U/L  15  11   ALT 0 - 44 U/L  18  12    PATHOLOGY: Component 12 d ago  Case Report Surgical Pathology Report                         Case: [id number]                               Authorizing Provider:  Gertie Baron, DMD  Collected:           03/26/2023 1040             Ordering Location:     [facility]  Received:            03/30/2023 1040                                    Maxillofacial Pathology  Pathologist:           Shaune Spittle, DDS                                                 Specimen:    Tongue, LEFT TONGUE                                                                    Diagnosis   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  The depth of invasion cannot be confidentially ascertained but exceeds 4.0 mm.  Tumor is present at all the margins of the specimen.   RADIOGRAPHIC STUDIES: I have personally reviewed the radiological images as listed and agreed with the findings in the report. No results found.  ASSESSMENT & PLAN Jacob [name] is a 72 y.o. 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  He is accompanied by his wife for this visit.  We reviewed the pathology  report in detail and discussed remaining diagnostic workup that is needed before we can finalize treatment recommendations.  Patient will proceed with laboratory evaluation today followed by staging PET scan scheduled for 04/15/2023.  Patient will need to be evaluated by ENT and radiation oncology.  We will present patient's case at multidisciplinary head neck tumor board after PET scan is obtained.  Patient expressed understanding of the workup and plan provided.  #Squamous cell carcinoma of the tongue: --Confirmed with biopsy on 03/26/2023. Will request p16 testing. --Need staging PET Scan --Referral to ENT and radiation oncology placed --Will present patient's case at multidisciplinary head neck tumor board after PET scan. --RTC once workup is complete  No orders of the defined types were placed in this encounter.   All questions were answered. The patient knows to call the clinic with any problems, questions or concerns.  I have spent a total of 60 minutes minutes of face-to-face and non-face-to-face time, preparing to see the patient, obtaining and/or reviewing separately obtained history, performing a medically appropriate examination, counseling and educating the patient, ordering tests/procedures, referring and communicating with other health care professionals, documenting clinical information in the electronic health record, and care coordination.   Georga Kaufmann, PA-C Department of Hematology/Oncology [facility] at [facility] Phone: [phone]  Patient was seen with Dr. Leonides Schanz.   I have read the above note and personally examined the patient. I agree with the assessment and plan as noted above.  Briefly Jacob [name] is a 72 year old male who presents for evaluation of a lesion on his tongue.  The patient noted he had this lesion that has been increasing in size for several weeks.  He notes that it is tender and does occasionally bleed.  On exam he has  a large mass growing out of the left side of his tongue.  He does have palpable cervical lymph  nodes, though they do not appear enlarged.  Biopsy has been performed by his oral surgeon and findings are consistent with squamous cell carcinoma.  At this time recommend PET CT scan for staging purposes and referral to ENT for consideration of resection.  Also discussed the patient may require radiation plus or minus chemotherapy pending the results of his final staging.  The patient voiced understanding of our plan moving forward.   Ulysees Barns, MD Department of Hematology/Oncology [facility] at [facility] Phone: [phone] Pager: [phone] Email: [email or web address]

## 2023-04-07 NOTE — Telephone Encounter (Signed)
can you get PET scan scheduled - 04/15/2023 at [facility]  IT and referral to ENT please-referral faxed to [facility]  Confirmation received

## 2023-04-07 NOTE — Telephone Encounter (Signed)
-----   Message from Briant Cedar, PA-C sent at 04/07/2023  2:17 PM EDT ----- Please notify patient that labs from today were reviewed and require no intervention.   ----- Message ----- From: Interface, Lab In [place] Sent: 04/07/2023  11:14 AM EDT To: Briant Cedar, PA-C

## 2023-04-08 DIAGNOSIS — Z0000 Encounter for general adult medical examination without abnormal findings: Secondary | ICD-10-CM | POA: Diagnosis not present

## 2023-04-09 DIAGNOSIS — Z86010 Personal history of colonic polyps: Secondary | ICD-10-CM | POA: Diagnosis not present

## 2023-04-09 DIAGNOSIS — I484 Atypical atrial flutter: Secondary | ICD-10-CM | POA: Diagnosis not present

## 2023-04-09 DIAGNOSIS — R600 Localized edema: Secondary | ICD-10-CM | POA: Diagnosis not present

## 2023-04-09 DIAGNOSIS — R911 Solitary pulmonary nodule: Secondary | ICD-10-CM | POA: Diagnosis not present

## 2023-04-09 DIAGNOSIS — E1169 Type 2 diabetes mellitus with other specified complication: Secondary | ICD-10-CM | POA: Diagnosis not present

## 2023-04-09 DIAGNOSIS — I700 Atherosclerosis of aorta: Secondary | ICD-10-CM | POA: Diagnosis not present

## 2023-04-09 DIAGNOSIS — E782 Mixed hyperlipidemia: Secondary | ICD-10-CM | POA: Diagnosis not present

## 2023-04-09 DIAGNOSIS — I10 Essential (primary) hypertension: Secondary | ICD-10-CM | POA: Diagnosis not present

## 2023-04-09 DIAGNOSIS — I2510 Atherosclerotic heart disease of native coronary artery without angina pectoris: Secondary | ICD-10-CM | POA: Diagnosis not present

## 2023-04-09 DIAGNOSIS — D6869 Other thrombophilia: Secondary | ICD-10-CM | POA: Diagnosis not present

## 2023-04-09 DIAGNOSIS — Z9889 Other specified postprocedural states: Secondary | ICD-10-CM | POA: Diagnosis not present

## 2023-04-09 DIAGNOSIS — M109 Gout, unspecified: Secondary | ICD-10-CM | POA: Diagnosis not present

## 2023-04-09 NOTE — Progress Notes (Signed)
Head and Neck Cancer Location of Tumor / Histology: tongue mass (left lateral border of tongue)  Pt to have pet scan on 04-15-23  Patient presented with symptoms of: (Per. Jacob [name] consult note on 04-02-23)  Lesion causes constant pain according to referral notes.   Biopsies revealed:  04-02-23   Nutrition Status Yes No Comments  Weight changes?   Yes, initially lost weight 174lb down to 160, now doing protein shakes and weight is up to 169.   Swallowing concerns?   Reports he does bite tongue occasionally, which is painful   PEG?   He knows he will be getting a peg tube aleady   Referrals Yes No Comments  Social Work?     Dentistry?   Was in process of getting new caps and partials, on hold now  Swallowing therapy?     Nutrition?     Med/Onc?      Safety Issues Yes No Comments  Prior radiation?     Pacemaker/ICD?     Possible current pregnancy?   na  Is the patient on methotrexate?      Tobacco/Marijuana/Snuff/ETOH use: He quit smoking in 1985.  Past/Anticipated interventions by otolaryngology, if any:      04-02-23 Jacob [name] follow up note  Past/Anticipated interventions by medical oncology, if any:  Jacob Kaufmann PA-C on 04-07-23  HISTORY OF PRESENTING ILLNESS:  Jacob [name] 72 y.o. 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    On review of the previous records, Jacob [name] was evaluated by a oral and maxillofacial surgeon on March 28th, 2024 to evaluate a lesion on the left lateral border of his tongue that was noted in March 2024. 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    On exam today, Jacob [name] reports that the lesion on his tongue is painful but do  not bleed or interfere with his ability to eat.  He is otherwise feeling well.  He does have chronic fatigue but contributes this to his poor sleeping habit for the last 2 years.  He continues to complete all his ADLs on his own.  He has a good appetite and denies any recent weight changes.  He denies nausea, vomiting or abdominal pain.  His bowel habits are unchanged without recurrent episodes of diarrhea or constipation.  He denies easy bruising or signs of active bleeding.  He denies fevers, chills, night sweats, shortness of breath, chest pain, cough, palpable lymph nodes, peripheral edema or neuropathy.  He has no other complaints.  Rest of the 10 point ROS is below.  ASSESSMENT & PLAN Jacob [name] is a 72 y.o. 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  He is accompanied by his wife for this visit.  We reviewed the pathology report in detail and discussed remaining diagnostic workup that is needed before we can finalize treatment recommendations.  Patient will proceed with laboratory evaluation today followed by staging PET scan scheduled for 04/15/2023.  Patient will need to be evaluated by ENT and radiation oncology.  We will present patient's case at multidisciplinary head neck tumor board after PET scan is obtained.  Patient expressed understanding of the workup and plan provided.   #Squamous cell carcinoma of the tongue: --Confirmed with biopsy on  03/26/2023. Will request p16 testing. --Need staging PET Scan --Referral to ENT and radiation oncology placed --Will present patient's case at multidisciplinary head neck tumor board after PET scan. --RTC once workup is complete  Current Complaints / other details:  "Scared to death" overall. Too overwhelmed to have exact questions. RN encouraged pt to write them down if they come to mind. Pt reports mild constant tongue pain well controlled with Ultram. Pt reports he has  not had to take Tylenol for pain while taking Ultram. Pt is a little familiar with Peg tubes as he used to drive a handicap bus. He stated he loved doing that.

## 2023-04-09 NOTE — Progress Notes (Signed)
Oncology Nurse Navigator Documentation   I met Jacob [name] and his wife after his consult with Karena Addison and Dr. Leonides Schanz on 4/16. I introduced myself as the head and neck nurse navigator here at [facility] and provided them with my direct contact information.   I have contacted [facility] and confirmed that he is scheduled on 4/19 to see Dr. Ernestene Kiel. He will also have his PET scan on 4/24 and see Dr. Basilio Cairo for Radiation consult on 4/26.   Karena Addison requested that I add p 16 testing to his biopsy from [facility] laboratory. I called and spoke with them and they requested a fax be sent with the request which I did and included my direct contact information. Receipt of successful fax transmission received.   Hedda Slade RN, BSN, OCN Head & Neck Oncology Nurse Navigator [facility] at [facility] Phone # [phone]  Fax # [phone]

## 2023-04-10 DIAGNOSIS — Z87891 Personal history of nicotine dependence: Secondary | ICD-10-CM | POA: Diagnosis not present

## 2023-04-10 DIAGNOSIS — Z7901 Long term (current) use of anticoagulants: Secondary | ICD-10-CM | POA: Diagnosis not present

## 2023-04-10 DIAGNOSIS — C029 Malignant neoplasm of tongue, unspecified: Secondary | ICD-10-CM | POA: Diagnosis not present

## 2023-04-10 DIAGNOSIS — I482 Chronic atrial fibrillation, unspecified: Secondary | ICD-10-CM | POA: Diagnosis not present

## 2023-04-13 ENCOUNTER — Other Ambulatory Visit: Payer: Self-pay | Admitting: Hematology and Oncology

## 2023-04-13 ENCOUNTER — Other Ambulatory Visit (HOSPITAL_COMMUNITY): Payer: Self-pay | Admitting: Otolaryngology

## 2023-04-13 DIAGNOSIS — C029 Malignant neoplasm of tongue, unspecified: Secondary | ICD-10-CM

## 2023-04-13 MED ORDER — TRAMADOL HCL 50 MG PO TABS: 50.0000 mg | ORAL_TABLET | Freq: Four times a day (QID) | ORAL | 0 refills | Status: AC | PRN

## 2023-04-14 DIAGNOSIS — K1370 Unspecified lesions of oral mucosa: Secondary | ICD-10-CM | POA: Diagnosis not present

## 2023-04-15 ENCOUNTER — Encounter (HOSPITAL_COMMUNITY)
Admission: RE | Admit: 2023-04-15 | Discharge: 2023-04-15 | Disposition: A | Discharge status: Home / Self Care | Payer: PPO | Source: Ambulatory Visit | Attending: Physician Assistant | Admitting: Physician Assistant

## 2023-04-15 ENCOUNTER — Telehealth: Payer: Self-pay

## 2023-04-15 ENCOUNTER — Encounter: Payer: Self-pay | Admitting: Radiation Oncology

## 2023-04-15 DIAGNOSIS — C029 Malignant neoplasm of tongue, unspecified: Secondary | ICD-10-CM | POA: Insufficient documentation

## 2023-04-15 LAB — GLUCOSE, CAPILLARY: Glucose-Capillary: 95 mg/dL (ref 70–99)

## 2023-04-15 MED ORDER — FLUDEOXYGLUCOSE F - 18 (FDG) INJECTION
7.7000 | Freq: Once | INTRAVENOUS | Status: AC
  Administered 2023-04-15: 8.24 via INTRAVENOUS

## 2023-04-15 NOTE — Telephone Encounter (Signed)
RN called pt to obtain information for meaningful use and consultation for Friday consult with Dr. Basilio Cairo. Pt overall was doing well with no major needs. Nurse consult noted routed to Dr. Basilio Cairo and Hedda Slade RN (nurse navigator).

## 2023-04-16 NOTE — Progress Notes (Signed)
Radiation Oncology         [phone] ________________________________  Initial Outpatient Consultation  Name: Jacob [name] MRN: [id number]  Date: 04/17/2023  DOB: 18-Jul-1951  HK:[id number], Kathleene Hazel, MD  Jaci Standard, MD   REFERRING PHYSICIAN: Jaci Standard, MD  DIAGNOSIS:    ICD-10-CM   1. 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 Squamous cell carcinoma of tongue (HCC)  C02.9        Cancer Staging  Squamous cell carcinoma of tongue (HCC) Staging form: Oral Cavity, AJCC 8th Edition - Clinical stage from 04/17/2023: Stage IVA (cT2, cN2c, cM0) - Signed by Lonie Peak, MD on 04/17/2023 Stage prefix: Initial diagnosis (PET shows borderline contralateral nodes)   CHIEF COMPLAINT: Here to discuss management of tongue cancer  HISTORY OF PRESENT ILLNESS::Jacob [name] is a 72 y.o. male who presented to Dr. Ellery Plunk at [facility] on 03/19/23 for evaluation of an enlarging left tongue lesion. Per encounter notes, the patient stated that he was seen by his dentist on 02/20/23 to discuss crown placement. He pointed out the lesion to his dentist who placed a referral to oral surgery for further evaluation. The patient was also started on dexamethasone mouth rinse by his dentist which he continues to use.   Biopsy of the left lateral tongue lesion collected by Dr. Ellery Plunk on 03/26/23 showed findings consistent with invasive well differentiated keratinizing squamous cell carcinoma, with evidence of PNI exceeding 4.0 mm. Tumor was noted to be present in all margins of the specimen.   Subsequently, the patient was referred to [facility] rapid diagnostic clinic on 04/07/23 for further management. The patient was evaluated by PA Thayil during this visit who recommended proceeding with p16 testing, PET scan for staging, and referrals to ENT and radiation oncology. She also recommended that his case be presented at the multidisciplinary head neck tumor  board.  The patient was accordingly referred to Dr. Ernestene Kiel (ENT) on 04/10/23 to further discuss treatment options. Curative treatment options were discussed with the patient including a glossectomy vs partial removal of the tongue, and a left neck dissection with possible tissue transfer/reconstruction. Dr. Ernestene Kiel has already placed an urgent referral to [facility] surgery. He is currently scheduled to meet with Dr. Hezzie Bump in consultation on 04/22/23.   Pertinent imaging thus far includes a PET scan performed on 04/15/23 which demonstrates: Primary and oral tongue and definite left-sided cervical adenopathy with possible contralateral right-sided adenopathy.  There is nonspecific hypermetabolic activity in the floor of mouth.  None of his lymph nodes are very large.  He has a focus of hypermetabolic activity in the distal transverse colon but he does state that he had a colonoscopy 2 years ago.  I personally reviewed his imaging.  A soft tissue neck CT has also been ordered by Dr. Ernestene Kiel.  This is pending  Swallowing issues, if any: none  Weight Changes: He reports he lost about 8 pounds but regained it  Wt Readings from Last 3 Encounters:  04/17/23 169 lb 8 oz (76.9 kg)  04/07/23 165 lb 4.8 oz (75 kg)  08/19/22 168 lb 9.6 oz (76.5 kg)    Pain status: mild pain at the site of the lesion; he denies any bleeding from the site and states that the pain does not interfere with his ability to eat  Other symptoms: some speech difficulties which have improved with dexamethasone mouth rinse use, and chronic  fatigue which he attributes to poor sleeping habits for the past several years  Tobacco history, if any: 15 pack year smoking history - quit in 1985 (smoked from the ages of age of [age])  ETOH abuse, if any: none  Prior cancers, if any: none  He  is retired.  He is with his supportive wife and daughter.  He lives about 20 minutes away in the [place] of [place].  Despite the  stress of his diagnosis he has a very good sense of humor today.  PREVIOUS RADIATION THERAPY: No  PAST MEDICAL HISTORY:  has a past medical history of A-fib (HCC), CAD (coronary artery disease), Dyslipidemia, Essential (primary) hypertension, Gout, Hyperlipidemia, unspecified, Hypertension, Myxomatous degeneration of mitral valve, Other fatigue, Other specified soft tissue disorders, Rheumatic mitral valve disease, unspecified, S/P mitral valve replacement (12/11/04), Type 2 diabetes mellitus without complications (HCC), and Unspecified atrial fibrillation (HCC).    PAST SURGICAL HISTORY: Past Surgical History:  Procedure Laterality Date   BIOPSY  09/23/2021   Procedure: BIOPSY;  Surgeon: Lanelle Bal, DO;  [facility];  Service: Endoscopy;;   CARDIAC CATHETERIZATION  12/03/04   sign. one vessel disease,severe MR   CARDIAC CATHETERIZATION  01/19/06   normal   CARDIAC CATHETERIZATION  06/06/10   mild nonobstructive CAD   COLONOSCOPY WITH PROPOFOL N/A 09/23/2021   Procedure: COLONOSCOPY WITH PROPOFOL;  Surgeon: Lanelle Bal, DO;  Location: [facility];  Service: Endoscopy;  Laterality: N/A;  9:15am   cyst  1980   removed from wrist   ESOPHAGOGASTRODUODENOSCOPY (EGD) WITH PROPOFOL N/A 09/23/2021   Procedure: ESOPHAGOGASTRODUODENOSCOPY (EGD) WITH PROPOFOL;  Surgeon: Lanelle Bal, DO;  Location[facility];  Service: Endoscopy;  Laterality: N/A;   MITRAL VALVE SURGERY  12/11/04   MV repair with Randa Evens ring,closure of PFO   POLYPECTOMY  09/23/2021   Procedure: POLYPECTOMY;  Surgeon: Lanelle Bal, DO;  Location: [facility];  Service: Endoscopy;;   RADIOFREQUENCY ABLATION  12/02/04   Dr. Monna Fam   SALIVARY GLAND SURGERY  2004   growth   TONSILLECTOMY  1959    FAMILY HISTORY: family history includes Cancer in his maternal grandmother and mother; Glaucoma in his mother.  SOCIAL HISTORY:  reports that he quit smoking about 39 years ago. His smoking use  included cigarettes. He has never used smokeless tobacco. He reports that he does not drink alcohol and does not use drugs.  ALLERGIES: Atenolol, Cardizem [diltiazem hcl], Melatonin, Metoprolol, and Clindamycin/lincomycin  MEDICATIONS:  Current Outpatient Medications  Medication Sig Dispense Refill   acetaminophen (TYLENOL) 500 MG tablet Take 1,000 mg by mouth every 6 (six) hours as needed for mild pain or headache.     allopurinol (ZYLOPRIM) 300 MG tablet TAKE 1 TABLET BY MOUTH ONCE A DAY. 90 tablet 0   cholecalciferol (VITAMIN D3) 25 MCG (1000 UNIT) tablet Take 1,000 Units by mouth daily.     metFORMIN (GLUCOPHAGE) 1000 MG tablet Take 1,000 mg by mouth daily with breakfast.     rosuvastatin (CRESTOR) 20 MG tablet Take 20 mg by mouth daily.     sitaGLIPtin (JANUVIA) 100 MG tablet Take 100 mg by mouth daily.     torsemide (DEMADEX) 20 MG tablet Take 1-2 tablets (20-40 mg total) by mouth daily. (Patient taking differently: Take 20 mg by mouth daily.) 90 tablet 1   traMADol (ULTRAM) 50 MG tablet Take 1 tablet (50 mg total) by mouth every 6 (six) hours as needed. 30  tablet 0   warfarin (COUMADIN) 5 MG tablet TAKE (1 & 1/2) TABLET BY MOUTH ONCE DAILY OR AS DIRECTED 50 tablet 5   No current facility-administered medications for this encounter.    REVIEW OF SYSTEMS:  Notable for that above.   PHYSICAL EXAM:  height is 6\' 1"  (1.854 m) and weight is 169 lb 8 oz (76.9 kg). His temporal temperature is 96.2 F (35.7 C) (abnormal). His blood pressure is 132/86 and his pulse is 70. His respiration is 18 and oxygen saturation is 100%.   General: Alert and oriented, in no acute distress HEENT: Head is normocephalic. Extraocular movements are intact. Oropharynx is notable for no lesions in upper throat.  Floor mouth is negative to visual inspection and palpation.  He has a mostly exophytic lesion in the left lateral oral tongue with an endophytic component which may in part be from his biopsy.  This lesion  is at least 3 cm in dimension Neck: Neck is notable for no palpable adenopathy.  He has a benign appearing and soft small mass in the skin along the right jawline which is less than half a centimeter which he states has been present for years Heart: Regular in rate and rhythm with no murmurs, rubs, or gallops. Chest: Clear to auscultation bilaterally, with no rhonchi, wheezes, or rales. Abdomen: Soft, nontender, nondistended, with no rigidity or guarding. Extremities: No cyanosis or edema. Lymphatics: see Neck Exam Skin: No concerning lesions. Musculoskeletal: symmetric strength and muscle tone throughout. Neurologic: Cranial nerves II through XII are grossly intact. No obvious focalities. Speech is fluent. Coordination is intact. Psychiatric: Judgment and insight are intact. Affect is appropriate.   ECOG = 1  0 - Asymptomatic (Fully active, able to carry on all predisease activities without restriction)  1 - Symptomatic but completely ambulatory (Restricted in physically strenuous activity but ambulatory and able to carry out work of a light or sedentary nature. For example, light housework, office work)  2 - Symptomatic, <50% in bed during the day (Ambulatory and capable of all self care but unable to carry out any work activities. Up and about more than 50% of waking hours)  3 - Symptomatic, >50% in bed, but not bedbound (Capable of only limited self-care, confined to bed or chair 50% or more of waking hours)  4 - Bedbound (Completely disabled. Cannot carry on any self-care. Totally confined to bed or chair)  5 - Death   Santiago Glad MM, Creech RH, Tormey DC, et al. [phone]). "Toxicity and response criteria of the [facility] Group". Am. Evlyn Clines. Oncol. 5 (6): 649-55   LABORATORY DATA:  Lab Results  Component Value Date   WBC 6.8 04/07/2023   HGB 15.0 04/07/2023   HCT 42.9 04/07/2023   MCV 86.1 04/07/2023   PLT 180 04/07/2023   CMP     Component Value Date/Time    NA 141 04/07/2023 1104   NA 141 03/19/2020 0807   K 4.6 04/07/2023 1104   CL 104 04/07/2023 1104   CO2 30 04/07/2023 1104   GLUCOSE 114 (H) 04/07/2023 1104   BUN 33 (H) 04/07/2023 1104   BUN 21 03/19/2020 0807   CREATININE 1.18 04/07/2023 1104   CALCIUM 10.6 (H) 04/07/2023 1104   PROT 7.9 04/07/2023 1104   PROT 7.1 03/19/2020 0807   ALBUMIN 4.9 04/07/2023 1104   ALBUMIN 4.7 03/19/2020 0807   AST 12 (L) 04/07/2023 1104   ALT 10 04/07/2023 1104   ALKPHOS 39 04/07/2023 1104  BILITOT 0.5 04/07/2023 1104   GFRNONAA >60 04/07/2023 1104   GFRAA 72 03/19/2020 0807      Lab Results  Component Value Date   TSH 0.900 04/07/2023     RADIOGRAPHY: NM PET Image Initial (PI) Skull Base To Thigh  Result Date: 04/17/2023 CLINICAL DATA:  Initial treatment strategy for squamous cell carcinoma of the tongue. EXAM: NUCLEAR MEDICINE PET SKULL BASE TO THIGH TECHNIQUE: 8.2 mCi F-18 FDG was injected intravenously. Full-ring PET imaging was performed from the skull base to thigh after the radiotracer. CT data was obtained and used for attenuation correction and anatomic localization. Fasting blood glucose: 95 mg/dl COMPARISON:  29/56/2130 chest CT.  07/10/2021 abdominopelvic CT FINDINGS: Mediastinal blood pool activity: SUV max 2.2 Liver activity: SUV max NA NECK: The minimally eccentric left sided tongue primary is poorly delineated on CT but measures a S.U.V. max of 13.2. Caudal and anterior to this, a separate focus of hypermetabolism in the anterior floor of mouth measures a S.U.V. max of 6.1 included on 35/4. A left-sided level 2A node measures 7 mm and a S.U.V. max of 6.5 on 37/4. A right-sided level 2 B node measures 5 mm and a S.U.V. max of 2.2 on 32/4. Incidental CT findings: Left carotid atherosclerosis. CHEST: No pulmonary parenchymal or thoracic nodal hypermetabolism. Incidental CT findings: Aortic atherosclerosis. Median sternotomy. Mild centrilobular emphysema. coronary artery atherosclerosis.  Mitral valve repair. Scattered tiny bilateral pulmonary nodules on the order of 3 mm maximally are suboptimally evaluated on this nondedicated exam. Primarily felt to be similar on 11/28/2021 CT, favoring a benign etiology. Well below PET resolution. ABDOMEN/PELVIS: Distal transverse colonic focus of hypermetabolism without well-defined CT correlate. Example at a S.U.V. max of 9.9 on approximately image 134/4. No abdominopelvic nodal hypermetabolism. Incidental CT findings: Normal adrenal glands. Abdominal aortic atherosclerosis. Mild prostatomegaly. SKELETON: No abnormal marrow activity. Incidental CT findings: None. IMPRESSION: 1. Left paramidline tongue primary with definite left-sided and equivocal right-sided level 2 nodal metastasis. No findings of extra cervical hypermetabolic metastasis. 2. Separate focus of hypermetabolism within the floor of mouth may relate to dental infection or could be physiologic. Consider physical exam correlation. 3. Focus of hypermetabolism within the distal transverse colon is indeterminate. Correlate with colon cancer screening history and if not up-to-date, consider colonoscopy. 4. Aortic atherosclerosis (ICD10-I70.0), coronary artery atherosclerosis and emphysema (ICD10-J43.9). Electronically Signed   By: Jeronimo Greaves M.D.   On: 04/17/2023 13:32      IMPRESSION/PLAN:  This is a delightful patient with head and neck cancer.  This is a T2 tongue cancer with ipsilateral neck disease based on his PET imaging and possibly contralateral neck disease which is rather equivocal on imaging.  Tentative staging is:   Cancer Staging  Squamous cell carcinoma of tongue (HCC) Staging form: Oral Cavity, AJCC 8th Edition - Clinical stage from 04/17/2023: Stage IVA (cT2, cN2c, cM0) - Signed by Lonie Peak, MD on 04/17/2023 Stage prefix: Initial diagnosis  Next week he will be seen at [facility] for consultation with Dr. Hezzie Bump.  He understands that upfront surgery will give him the  best chance of cure.  He also understands that he will need postoperative radiation therapy which will take place for approximately 6 weeks and be directed at the tongue and bilateral neck nodes.  It is unclear at this point whether he will need adjuvant chemotherapy but a referral can be made postoperatively if appropriate.  We discussed the potential risks, benefits, and side effects of radiotherapy. We talked in detail  about acute and late effects. We discussed that some of the most bothersome acute effects may be mucositis, dysgeusia, salivary changes, skin irritation, hair loss, dehydration, weight loss and fatigue. We talked about late effects which include but are not necessarily limited to dysphagia, hypothyroidism, nerve injury, vascular injury, spinal cord injury, xerostomia, trismus, neck edema, and potential injury to any of the tissues in the head and neck region. No guarantees of treatment were given. A consent form was signed and placed in the patient's medical record. The patient is enthusiastic about proceeding with treatment. I look forward to participating in the patient's care.    We also discussed that the treatment of head and neck cancer is a multidisciplinary process to maximize treatment outcomes and quality of life. For this reason the following referrals have been or will be made:     Dentistry for dental evaluation, possible extractions in the radiation fields, and /or advice on reducing risk of cavities, osteoradionecrosis, or other oral issues.  He will also need scatter protection devices.   Nutritionist for nutrition support during and after treatment.   Speech language pathology for swallowing and/or speech therapy.   Social work for social support.    Physical therapy due to risk of lymphedema in neck and deconditioning.     On date of service, in total, I spent 65 minutes on this encounter. Patient was seen in  person.  __________________________________________   Lonie Peak, MD  This document serves as a record of services personally performed by Lonie Peak, MD. It was created on her behalf by Neena Rhymes, a trained medical scribe. The creation of this record is based on the scribe's personal observations and the provider's statements to them. This document has been checked and approved by the attending provider.

## 2023-04-17 ENCOUNTER — Encounter: Payer: Self-pay | Admitting: Radiation Oncology

## 2023-04-17 ENCOUNTER — Ambulatory Visit
Admission: RE | Admit: 2023-04-17 | Discharge: 2023-04-17 | Disposition: A | Discharge status: Home / Self Care | Payer: PPO | Source: Ambulatory Visit | Attending: Radiation Oncology | Admitting: Radiation Oncology

## 2023-04-17 VITALS — BP 132/86 | HR 70 | Temp 96.2°F | Resp 18 | Ht 73.0 in | Wt 169.5 lb

## 2023-04-17 DIAGNOSIS — Z79899 Other long term (current) drug therapy: Secondary | ICD-10-CM | POA: Insufficient documentation

## 2023-04-17 DIAGNOSIS — I700 Atherosclerosis of aorta: Secondary | ICD-10-CM | POA: Diagnosis not present

## 2023-04-17 DIAGNOSIS — I10 Essential (primary) hypertension: Secondary | ICD-10-CM | POA: Diagnosis not present

## 2023-04-17 DIAGNOSIS — R479 Unspecified speech disturbances: Secondary | ICD-10-CM | POA: Insufficient documentation

## 2023-04-17 DIAGNOSIS — J432 Centrilobular emphysema: Secondary | ICD-10-CM | POA: Insufficient documentation

## 2023-04-17 DIAGNOSIS — E785 Hyperlipidemia, unspecified: Secondary | ICD-10-CM | POA: Insufficient documentation

## 2023-04-17 DIAGNOSIS — E119 Type 2 diabetes mellitus without complications: Secondary | ICD-10-CM | POA: Diagnosis not present

## 2023-04-17 DIAGNOSIS — I6522 Occlusion and stenosis of left carotid artery: Secondary | ICD-10-CM | POA: Insufficient documentation

## 2023-04-17 DIAGNOSIS — Z87891 Personal history of nicotine dependence: Secondary | ICD-10-CM | POA: Diagnosis not present

## 2023-04-17 DIAGNOSIS — Z7901 Long term (current) use of anticoagulants: Secondary | ICD-10-CM | POA: Diagnosis not present

## 2023-04-17 DIAGNOSIS — I2510 Atherosclerotic heart disease of native coronary artery without angina pectoris: Secondary | ICD-10-CM | POA: Diagnosis not present

## 2023-04-17 DIAGNOSIS — I4891 Unspecified atrial fibrillation: Secondary | ICD-10-CM | POA: Insufficient documentation

## 2023-04-17 DIAGNOSIS — N400 Enlarged prostate without lower urinary tract symptoms: Secondary | ICD-10-CM | POA: Diagnosis not present

## 2023-04-17 DIAGNOSIS — M109 Gout, unspecified: Secondary | ICD-10-CM | POA: Insufficient documentation

## 2023-04-17 DIAGNOSIS — C029 Malignant neoplasm of tongue, unspecified: Secondary | ICD-10-CM | POA: Diagnosis not present

## 2023-04-17 DIAGNOSIS — C021 Malignant neoplasm of border of tongue: Secondary | ICD-10-CM | POA: Diagnosis not present

## 2023-04-17 DIAGNOSIS — Z7984 Long term (current) use of oral hypoglycemic drugs: Secondary | ICD-10-CM | POA: Insufficient documentation

## 2023-04-17 NOTE — Progress Notes (Signed)
Oncology Nurse Navigator Documentation   Met with patient during initial consult with Dr. Basilio Cairo. He was accompanied by his wife Elease Hashimoto and daughter Fleet Contras.  Further introduced myself as his/their Navigator, explained my role as a member of the Care Team. He is scheduled to see Dr. Hezzie Bump on 5/1 to discuss surgery options. If he has surgery he will most likely be referred back to Dr. Basilio Cairo for radiation treatment.  Referral placed to Dr. Dutch Quint for pre radiation dental evaluation. I will also notify [facility] dentists that he will need SPDs as well.   They verbalized understanding of information provided. I encouraged them to call with questions/concerns moving forward.  Hedda Slade, RN, BSN, OCN Head & Neck Oncology Nurse Navigator [facility] [place] [phone]

## 2023-04-20 ENCOUNTER — Ambulatory Visit: Payer: PPO | Attending: Cardiology | Admitting: *Deleted

## 2023-04-20 ENCOUNTER — Telehealth: Payer: Self-pay | Admitting: Physician Assistant

## 2023-04-20 DIAGNOSIS — Z5181 Encounter for therapeutic drug level monitoring: Secondary | ICD-10-CM

## 2023-04-20 DIAGNOSIS — I4892 Unspecified atrial flutter: Secondary | ICD-10-CM

## 2023-04-20 DIAGNOSIS — I4891 Unspecified atrial fibrillation: Secondary | ICD-10-CM

## 2023-04-20 LAB — POCT INR: INR: 4.3 — AB (ref 2.0–3.0)

## 2023-04-20 NOTE — Patient Instructions (Signed)
Hold warfarin tonight then decrease dose to 1 1/2 tablets daily except 1 tablet on Tuesdays.. Recheck in 2 weeks.

## 2023-04-20 NOTE — Telephone Encounter (Signed)
I called patient and his wife by their request to discuss if patient can continue tramadol prescription since INR level was 4.3 today. I verified with coumadin clinic Vashti Hey, RN) who confirmed patient can continue tramadol prescription. I shared recommendations with patient and his wife.

## 2023-04-22 DIAGNOSIS — Z7901 Long term (current) use of anticoagulants: Secondary | ICD-10-CM | POA: Diagnosis not present

## 2023-04-22 DIAGNOSIS — C029 Malignant neoplasm of tongue, unspecified: Secondary | ICD-10-CM | POA: Diagnosis not present

## 2023-04-23 ENCOUNTER — Encounter: Payer: Self-pay | Admitting: Cardiovascular Disease

## 2023-04-23 ENCOUNTER — Telehealth: Payer: Self-pay | Admitting: *Deleted

## 2023-04-23 NOTE — Telephone Encounter (Signed)
   Pre-operative Risk Assessment    Patient Name: Jacob [name]  DOB: 06/07/1951 MRN: [id number]      Request for Surgical Clearance    Procedure:   GLOSSECTOMY, TRACHEOSTOMY, FREE FLAP, SKIN GRAFT, NECK DISSECTION  Date of Surgery:  Clearance 05/15/23                                 Surgeon:  DR. Corey Skains Surgeon's Group or Practice Name:  [facility] Phone number:  [phone] Fax number:  [phone]   Type of Clearance Requested:   - Medical  - Pharmacy:  Hold Warfarin (Coumadin)     Type of Anesthesia:  NOT INDICATED (General ?)   Additional requests/questions:    [name]   04/23/2023, 2:13 PM

## 2023-04-24 ENCOUNTER — Telehealth: Payer: Self-pay | Admitting: *Deleted

## 2023-04-24 NOTE — Telephone Encounter (Signed)
   Name: Jacob [name]  DOB: 02-13-1951  MRN: [id number]  Primary Cardiologist: Thurmon Fair, MD   Preoperative team, please contact this patient and set up a phone call appointment for further preoperative risk assessment. Please obtain consent and complete medication review. Thank you for your help.  I confirm that guidance regarding antiplatelet and oral anticoagulation therapy has been completed and, if necessary, noted below.  Pharmacy has provided recommendations for holding anticoagulation.   Ronney Asters, NP 04/24/2023, 11:42 AM [facility]

## 2023-04-24 NOTE — Telephone Encounter (Signed)
Pt has been scheduled for tele pre op appt 05/01/23 @ 9:40. Med rec and consent are done

## 2023-04-24 NOTE — Telephone Encounter (Signed)
Patient with diagnosis of afib on warfarin for anticoagulation.    Procedure: GLOSSECTOMY, TRACHEOSTOMY, FREE FLAP, SKIN GRAFT, NECK DISSECTION   Date of procedure: 05/15/23  CHA2DS2-VASc Score = 4  This indicates a 4.8% annual risk of stroke. The patient's score is based upon: CHF History: 0 HTN History: 1 Diabetes History: 1 Stroke History: 0 Vascular Disease History: 1 Age Score: 1 Gender Score: 0  CrCl 86mL/min Platelet count 180K  Per office protocol, patient can hold warfarin for 5 days prior to procedure. Patient will not need bridging with Lovenox (enoxaparin) around procedure.  **This guidance is not considered finalized until pre-operative APP has relayed final recommendations.**

## 2023-04-24 NOTE — Telephone Encounter (Signed)
Pt has been scheduled for tele pre op appt 05/01/23 @ 9:40. Med rec and consent are done.     Patient Consent for Virtual Visit        Jacob [name] has provided verbal consent on 04/24/2023 for a virtual visit (video or telephone).   CONSENT FOR VIRTUAL VISIT FOR:  Jacob [name]  By participating in this virtual visit I agree to the following:  I hereby voluntarily request, consent and authorize [facility] and its employed or contracted physicians, physician assistants, nurse practitioners or other licensed health care professionals (the Practitioner), to provide me with telemedicine health care services (the "Services") as deemed necessary by the treating Practitioner. I acknowledge and consent to receive the Services by the Practitioner via telemedicine. I understand that the telemedicine visit will involve communicating with the Practitioner through live audiovisual communication technology and the disclosure of certain medical information by electronic transmission. I acknowledge that I have been given the opportunity to request an in-person assessment or other available alternative prior to the telemedicine visit and am voluntarily participating in the telemedicine visit.  I understand that I have the right to withhold or withdraw my consent to the use of telemedicine in the course of my care at any time, without affecting my right to future care or treatment, and that the Practitioner or I may terminate the telemedicine visit at any time. I understand that I have the right to inspect all information obtained and/or recorded in the course of the telemedicine visit and may receive copies of available information for a reasonable fee.  I understand that some of the potential risks of receiving the Services via telemedicine include:  Delay or interruption in medical evaluation due to technological equipment failure or disruption; Information transmitted may not be sufficient (e.g.  poor resolution of images) to allow for appropriate medical decision making by the Practitioner; and/or  In rare instances, security protocols could fail, causing a breach of personal health information.  Furthermore, I acknowledge that it is my responsibility to provide information about my medical history, conditions and care that is complete and accurate to the best of my ability. I acknowledge that Practitioner's advice, recommendations, and/or decision may be based on factors not within their control, such as incomplete or inaccurate data provided by me or distortions of diagnostic images or specimens that may result from electronic transmissions. I understand that the practice of medicine is not an exact science and that Practitioner makes no warranties or guarantees regarding treatment outcomes. I acknowledge that a copy of this consent can be made available to me via my patient portal [facility] MyChart), or I can request a printed copy by calling the office of [facility].    I understand that my insurance will be billed for this visit.   I have read or had this consent read to me. I understand the contents of this consent, which adequately explains the benefits and risks of the Services being provided via telemedicine.  I have been provided ample opportunity to ask questions regarding this consent and the Services and have had my questions answered to my satisfaction. I give my informed consent for the services to be provided through the use of telemedicine in my medical care

## 2023-04-30 ENCOUNTER — Ambulatory Visit (HOSPITAL_COMMUNITY)
Admission: RE | Admit: 2023-04-30 | Discharge: 2023-04-30 | Disposition: A | Discharge status: Home / Self Care | Payer: PPO | Source: Ambulatory Visit | Attending: Otolaryngology | Admitting: Otolaryngology

## 2023-04-30 DIAGNOSIS — C029 Malignant neoplasm of tongue, unspecified: Secondary | ICD-10-CM | POA: Diagnosis not present

## 2023-04-30 MED ORDER — SODIUM CHLORIDE (PF) 0.9 % IJ SOLN
INTRAMUSCULAR | Status: AC
  Filled 2023-04-30: qty 50

## 2023-04-30 MED ORDER — IOHEXOL 300 MG/ML  SOLN
75.0000 mL | Freq: Once | INTRAMUSCULAR | Status: AC | PRN
  Administered 2023-04-30: 75 mL via INTRAVENOUS

## 2023-05-01 ENCOUNTER — Ambulatory Visit: Payer: PPO | Attending: Internal Medicine

## 2023-05-01 DIAGNOSIS — Z01810 Encounter for preprocedural cardiovascular examination: Secondary | ICD-10-CM

## 2023-05-01 NOTE — Progress Notes (Signed)
Virtual Visit via Telephone Note   Because of ALOYS GLANZMAN co-morbid illnesses, he is at least at moderate risk for complications without adequate follow up.  This format is felt to be most appropriate for this patient at this time.  The patient did not have access to video technology/had technical difficulties with video requiring transitioning to audio format only (telephone).  All issues noted in this document were discussed and addressed.  No physical exam could be performed with this format.  Please refer to the patient's chart for his consent to telehealth for [facility].  Evaluation Performed:  Preoperative cardiovascular risk assessment _____________   Date:  05/01/2023   Patient ID:  Jacob [name], DOB 27-Mar-1951, MRN [id number] Patient Location:  Home Provider location:   Office  Primary Care Provider:  Benita Stabile, MD Primary Cardiologist:  Thurmon Fair, MD  Chief Complaint / Patient Profile   72 y.o. y/o male with a h/o atypical atrial flutter and atrial fibrillation, history of MV repair (2005, 28 mm annuloplasty ring with Dr. Noel Gerold) with oversewing of left atrial appendage, history of ablation for AV node reentry in 2005 as well, minor nonobstructive CAD (40% RCA by recent coronary angiogram 2011) who is pending glossectomy, tracheostomy, free flap, skin graft, and neck dissection on 05/15/2023 and presents today for telephonic preoperative cardiovascular risk assessment.  History of Present Illness    Jacob [name] is a 72 y.o. male who presents via audio/video conferencing for a telehealth visit today.  Pt was last seen in cardiology clinic on 08/19/2022 by Dr. Royann Shivers.  At that time Jacob [name] was doing well .  The patient is now pending procedure as outlined above. Since his last visit, he tells me that he does get easily fatigued but likely related to the cancer.  He can do all of the indoor tasks and was walking a bunch before his  diagnosis but now is walking less.  Still feels like he can walk 1-2 blocks.  Has not been able to do yard work since his heart surgery.  He does surpass 4 METS on the DASI.  No new cardiac issues since he was seen in the office.  Denies chest pain, shortness of breath.  Most recent echocardiogram reviewed (2021) with mitral valve functioning well status postrepair.  Per office protocol, patient can hold warfarin for 5 days prior to procedure. Patient will not need bridging with Lovenox (enoxaparin) around procedure. He can restart when medically safe to do so.   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 one vessel disease,severe MR   CARDIAC CATHETERIZATION  01/19/06   normal   CARDIAC  CATHETERIZATION  06/06/10   mild nonobstructive CAD   COLONOSCOPY WITH PROPOFOL N/A 09/23/2021   Procedure: COLONOSCOPY WITH PROPOFOL;  Surgeon: Lanelle Bal, DO;  Location: [facility];  Service: Endoscopy;  Laterality: N/A;  9:15am   cyst  1980   removed from wrist   ESOPHAGOGASTRODUODENOSCOPY (EGD) WITH PROPOFOL N/A 09/23/2021   Procedure: ESOPHAGOGASTRODUODENOSCOPY (EGD) WITH PROPOFOL;  Surgeon: Lanelle Bal, DO;  Location: [facility];  Service: Endoscopy;  Laterality:  N/A;   MITRAL VALVE SURGERY  12/11/04   MV repair with Randa Evens ring,closure of PFO   POLYPECTOMY  09/23/2021   Procedure: POLYPECTOMY;  Surgeon: Lanelle Bal, DO;  Location: [facility];  Service: Endoscopy;;   RADIOFREQUENCY ABLATION  12/02/04   Dr. Monna Fam   SALIVARY GLAND SURGERY  2004   growth   TONSILLECTOMY  1959    Allergies  Allergies  Allergen Reactions   Atenolol Other (See Comments)    Personality changes   Cardizem [Diltiazem Hcl]     Pt unsure of reaction   Melatonin     May have caused depression and anxiety   Metoprolol Other (See Comments)    Personality changes   Clindamycin/Lincomycin Other (See Comments)    Acid refux    Home Medications    Prior to Admission medications   Medication Sig Start Date End Date Taking? Authorizing Provider  acetaminophen (TYLENOL) 500 MG tablet Take 1,000 mg by mouth every 6 (six) hours as needed for mild pain or headache.    [provider]  allopurinol (ZYLOPRIM) 300 MG tablet TAKE 1 TABLET BY MOUTH ONCE A DAY. 01/08/21   Croitoru, Mihai, MD  cholecalciferol (VITAMIN D3) 25 MCG (1000 UNIT) tablet Take 1,000 Units by mouth daily.    [provider]  gabapentin (NEURONTIN) 100 MG capsule Take 100 mg by mouth at bedtime. 04/22/23 04/21/24  [provider]  metFORMIN (GLUCOPHAGE) 1000 MG tablet Take 1,000 mg by mouth daily with breakfast. 08/06/22   [provider]  rosuvastatin (CRESTOR) 20 MG tablet Take 20 mg by mouth daily. 06/23/22   [provider]  sitaGLIPtin (JANUVIA) 100 MG tablet Take 100 mg by mouth daily.    [provider]  torsemide (DEMADEX) 20 MG tablet Take 1-2 tablets (20-40 mg total) by mouth daily. Patient taking differently: Take 20 mg by mouth daily. 06/22/20   Abelino Derrick, PA-C  traMADol (ULTRAM) 50 MG tablet Take 1 tablet (50 mg total) by mouth every 6 (six) hours as needed. 04/13/23   Jaci Standard, MD  warfarin (COUMADIN) 5 MG tablet TAKE (1 &  1/2) TABLET BY MOUTH ONCE DAILY OR AS DIRECTED 02/05/23   Croitoru, Rachelle Hora, MD    Physical Exam    Vital Signs:  Jacob [name] does not have vital signs available for review today.  Given telephonic nature of communication, physical exam is limited. AAOx3. NAD. Normal affect.  Speech and respirations are unlabored.  Accessory Clinical Findings    None  Assessment & Plan    1.  Preoperative Cardiovascular Risk Assessment:  Jacob [name] perioperative risk of a major cardiac event is 6.6% according to the Revised Cardiac Risk Index (RCRI).  Therefore, he is at high risk for perioperative complications.   His functional capacity is good at 5.07 METs according to the [facility] Activity Status Index (DASI). Recommendations: According to ACC/AHA guidelines, no further cardiovascular testing needed.  The patient may proceed to surgery at acceptable risk.  Antiplatelet and/or Anticoagulation Recommendations:  Coumadin can by held for 5 days prior to surgery.  Please resume post op when felt to be safe.   A copy of this note will be routed to requesting surgeon.  Time:   Today, I have spent 10 minutes with the patient with telehealth technology discussing medical history, symptoms, and management plan.     Sharlene Dory, PA-C  05/01/2023, 9:38 AM

## 2023-05-04 ENCOUNTER — Ambulatory Visit: Payer: PPO | Attending: Cardiology | Admitting: *Deleted

## 2023-05-04 DIAGNOSIS — I4891 Unspecified atrial fibrillation: Secondary | ICD-10-CM

## 2023-05-04 DIAGNOSIS — I4892 Unspecified atrial flutter: Secondary | ICD-10-CM

## 2023-05-04 DIAGNOSIS — Z5181 Encounter for therapeutic drug level monitoring: Secondary | ICD-10-CM | POA: Diagnosis not present

## 2023-05-04 LAB — POCT INR: INR: 1.8 — AB (ref 2.0–3.0)

## 2023-05-04 NOTE — Patient Instructions (Signed)
Take warfarin 2 tablets tonight then increase dose to 1 1/2 tablets daily. Pending oral surgery on tongue 5/24.  Hold warfarin 5 days before procedure and resume after surgery per MD  Will be in hospital 5-6 days after. Recheck in 2 weeks.

## 2023-05-05 DIAGNOSIS — C029 Malignant neoplasm of tongue, unspecified: Secondary | ICD-10-CM | POA: Diagnosis not present

## 2023-05-12 DIAGNOSIS — C029 Malignant neoplasm of tongue, unspecified: Secondary | ICD-10-CM | POA: Diagnosis not present

## 2023-05-12 DIAGNOSIS — I10 Essential (primary) hypertension: Secondary | ICD-10-CM | POA: Diagnosis not present

## 2023-05-12 DIAGNOSIS — I2510 Atherosclerotic heart disease of native coronary artery without angina pectoris: Secondary | ICD-10-CM | POA: Diagnosis not present

## 2023-05-12 DIAGNOSIS — I480 Paroxysmal atrial fibrillation: Secondary | ICD-10-CM | POA: Diagnosis not present

## 2023-05-12 DIAGNOSIS — Z87891 Personal history of nicotine dependence: Secondary | ICD-10-CM | POA: Diagnosis not present

## 2023-05-12 DIAGNOSIS — Z7901 Long term (current) use of anticoagulants: Secondary | ICD-10-CM | POA: Diagnosis not present

## 2023-05-12 DIAGNOSIS — E119 Type 2 diabetes mellitus without complications: Secondary | ICD-10-CM | POA: Diagnosis not present

## 2023-05-12 DIAGNOSIS — E785 Hyperlipidemia, unspecified: Secondary | ICD-10-CM | POA: Diagnosis not present

## 2023-05-15 DIAGNOSIS — Z87891 Personal history of nicotine dependence: Secondary | ICD-10-CM | POA: Diagnosis not present

## 2023-05-15 DIAGNOSIS — E785 Hyperlipidemia, unspecified: Secondary | ICD-10-CM | POA: Diagnosis not present

## 2023-05-15 DIAGNOSIS — A4289 Other forms of actinomycosis: Secondary | ICD-10-CM | POA: Diagnosis not present

## 2023-05-15 DIAGNOSIS — Z79899 Other long term (current) drug therapy: Secondary | ICD-10-CM | POA: Diagnosis not present

## 2023-05-15 DIAGNOSIS — I2510 Atherosclerotic heart disease of native coronary artery without angina pectoris: Secondary | ICD-10-CM | POA: Diagnosis not present

## 2023-05-15 DIAGNOSIS — Z9049 Acquired absence of other specified parts of digestive tract: Secondary | ICD-10-CM | POA: Diagnosis not present

## 2023-05-15 DIAGNOSIS — I482 Chronic atrial fibrillation, unspecified: Secondary | ICD-10-CM | POA: Diagnosis not present

## 2023-05-15 DIAGNOSIS — C020 Malignant neoplasm of dorsal surface of tongue: Secondary | ICD-10-CM | POA: Diagnosis not present

## 2023-05-15 DIAGNOSIS — Z6822 Body mass index (BMI) 22.0-22.9, adult: Secondary | ICD-10-CM | POA: Diagnosis not present

## 2023-05-15 DIAGNOSIS — R1310 Dysphagia, unspecified: Secondary | ICD-10-CM | POA: Diagnosis not present

## 2023-05-15 DIAGNOSIS — Z9889 Other specified postprocedural states: Secondary | ICD-10-CM | POA: Diagnosis not present

## 2023-05-15 DIAGNOSIS — M109 Gout, unspecified: Secondary | ICD-10-CM | POA: Diagnosis not present

## 2023-05-15 DIAGNOSIS — R1319 Other dysphagia: Secondary | ICD-10-CM | POA: Diagnosis not present

## 2023-05-15 DIAGNOSIS — C021 Malignant neoplasm of border of tongue: Secondary | ICD-10-CM | POA: Diagnosis not present

## 2023-05-15 DIAGNOSIS — E1165 Type 2 diabetes mellitus with hyperglycemia: Secondary | ICD-10-CM | POA: Diagnosis not present

## 2023-05-15 DIAGNOSIS — Z7901 Long term (current) use of anticoagulants: Secondary | ICD-10-CM | POA: Diagnosis not present

## 2023-05-15 DIAGNOSIS — C029 Malignant neoplasm of tongue, unspecified: Secondary | ICD-10-CM | POA: Diagnosis not present

## 2023-05-15 DIAGNOSIS — Z930 Tracheostomy status: Secondary | ICD-10-CM | POA: Diagnosis not present

## 2023-05-15 DIAGNOSIS — E440 Moderate protein-calorie malnutrition: Secondary | ICD-10-CM | POA: Diagnosis not present

## 2023-05-15 DIAGNOSIS — C109 Malignant neoplasm of oropharynx, unspecified: Secondary | ICD-10-CM | POA: Diagnosis not present

## 2023-05-15 DIAGNOSIS — C760 Malignant neoplasm of head, face and neck: Secondary | ICD-10-CM | POA: Diagnosis not present

## 2023-05-15 DIAGNOSIS — Z7984 Long term (current) use of oral hypoglycemic drugs: Secondary | ICD-10-CM | POA: Diagnosis not present

## 2023-05-15 DIAGNOSIS — I10 Essential (primary) hypertension: Secondary | ICD-10-CM | POA: Diagnosis not present

## 2023-05-15 DIAGNOSIS — Z4659 Encounter for fitting and adjustment of other gastrointestinal appliance and device: Secondary | ICD-10-CM | POA: Diagnosis not present

## 2023-05-15 DIAGNOSIS — D62 Acute posthemorrhagic anemia: Secondary | ICD-10-CM | POA: Diagnosis not present

## 2023-05-15 DIAGNOSIS — Z4889 Encounter for other specified surgical aftercare: Secondary | ICD-10-CM | POA: Diagnosis not present

## 2023-05-15 DIAGNOSIS — C770 Secondary and unspecified malignant neoplasm of lymph nodes of head, face and neck: Secondary | ICD-10-CM | POA: Diagnosis not present

## 2023-05-15 HISTORY — PX: OTHER SURGICAL HISTORY: SHX169

## 2023-05-15 HISTORY — DX: Malignant neoplasm of tongue, unspecified: C02.9

## 2023-05-24 DIAGNOSIS — Z9049 Acquired absence of other specified parts of digestive tract: Secondary | ICD-10-CM | POA: Diagnosis not present

## 2023-05-24 DIAGNOSIS — C020 Malignant neoplasm of dorsal surface of tongue: Secondary | ICD-10-CM | POA: Diagnosis not present

## 2023-05-24 DIAGNOSIS — E119 Type 2 diabetes mellitus without complications: Secondary | ICD-10-CM | POA: Diagnosis not present

## 2023-05-25 ENCOUNTER — Ambulatory Visit: Payer: PPO | Attending: Internal Medicine | Admitting: *Deleted

## 2023-05-25 DIAGNOSIS — I4891 Unspecified atrial fibrillation: Secondary | ICD-10-CM

## 2023-05-25 DIAGNOSIS — I4892 Unspecified atrial flutter: Secondary | ICD-10-CM | POA: Diagnosis not present

## 2023-05-25 DIAGNOSIS — Z5181 Encounter for therapeutic drug level monitoring: Secondary | ICD-10-CM | POA: Diagnosis not present

## 2023-05-25 LAB — POCT INR: INR: 1.1 — AB (ref 2.0–3.0)

## 2023-05-25 NOTE — Patient Instructions (Signed)
Take warfarin 2 1/2 tablets tonight and tomorrow night then continue 1 1/2 tablets daily. S/P oral surgery on tongue 5/24.  Restarted warfarin 6/1 Will start radiation in appx 6 wks x 6 wks. Recheck in 1 week

## 2023-05-28 DIAGNOSIS — Z09 Encounter for follow-up examination after completed treatment for conditions other than malignant neoplasm: Secondary | ICD-10-CM | POA: Diagnosis not present

## 2023-05-29 ENCOUNTER — Emergency Department (HOSPITAL_COMMUNITY): Payer: PPO

## 2023-05-29 ENCOUNTER — Encounter (HOSPITAL_COMMUNITY): Payer: Self-pay

## 2023-05-29 ENCOUNTER — Other Ambulatory Visit: Payer: Self-pay

## 2023-05-29 ENCOUNTER — Emergency Department (HOSPITAL_COMMUNITY)
Admission: EM | Admit: 2023-05-29 | Discharge: 2023-05-29 | Disposition: A | Discharge status: Home / Self Care | Payer: PPO | Attending: Emergency Medicine | Admitting: Emergency Medicine

## 2023-05-29 DIAGNOSIS — R000 Tachycardia, unspecified: Secondary | ICD-10-CM | POA: Diagnosis not present

## 2023-05-29 DIAGNOSIS — I700 Atherosclerosis of aorta: Secondary | ICD-10-CM | POA: Insufficient documentation

## 2023-05-29 DIAGNOSIS — R911 Solitary pulmonary nodule: Secondary | ICD-10-CM | POA: Diagnosis not present

## 2023-05-29 DIAGNOSIS — Z430 Encounter for attention to tracheostomy: Secondary | ICD-10-CM | POA: Diagnosis not present

## 2023-05-29 DIAGNOSIS — J9509 Other tracheostomy complication: Secondary | ICD-10-CM | POA: Diagnosis not present

## 2023-05-29 DIAGNOSIS — R0602 Shortness of breath: Secondary | ICD-10-CM | POA: Diagnosis not present

## 2023-05-29 DIAGNOSIS — M5146 Schmorl's nodes, lumbar region: Secondary | ICD-10-CM | POA: Insufficient documentation

## 2023-05-29 DIAGNOSIS — K8689 Other specified diseases of pancreas: Secondary | ICD-10-CM | POA: Insufficient documentation

## 2023-05-29 DIAGNOSIS — M8589 Other specified disorders of bone density and structure, multiple sites: Secondary | ICD-10-CM | POA: Diagnosis not present

## 2023-05-29 DIAGNOSIS — C760 Malignant neoplasm of head, face and neck: Secondary | ICD-10-CM | POA: Insufficient documentation

## 2023-05-29 DIAGNOSIS — R5381 Other malaise: Secondary | ICD-10-CM | POA: Diagnosis not present

## 2023-05-29 DIAGNOSIS — J9500 Unspecified tracheostomy complication: Secondary | ICD-10-CM | POA: Diagnosis not present

## 2023-05-29 DIAGNOSIS — K3189 Other diseases of stomach and duodenum: Secondary | ICD-10-CM | POA: Diagnosis not present

## 2023-05-29 DIAGNOSIS — J9503 Malfunction of tracheostomy stoma: Secondary | ICD-10-CM | POA: Diagnosis not present

## 2023-05-29 NOTE — Discharge Instructions (Signed)
Please follow-up with your surgical team as scheduled.  Return to the emergency department if any worsening or concerning symptoms.

## 2023-05-29 NOTE — Progress Notes (Signed)
Upon arrival RT informed patient had removed trach and needed assistance with reinsertion. RT reinserted a #6 shiley Cuffless trach in patient without any complications noted. Patient remained stable throughout. MD and RN at bedside. Trach secured with trach collar. BBS ausculated and  Xray was performed. SATs 99%, H.R 102 and RR 17.

## 2023-05-29 NOTE — Procedures (Signed)
Bedside Tracheostomy Insertion Procedure Note   Patient Details:   Name: Jacob [name] DOB: [birth year] MRN: [id number]  Procedure: Tracheostomy  Pre Procedure Assessment: ET Tube Size: ET Tube secured at lip (cm): Bite block in place: No Breath Sounds: Clear  Post Procedure Assessment: Temp 97.6 F (36.4 C) (Axillary)   Resp 19   Ht 6' (1.829 m)   Wt 71.2 kg   BMI 21.29 kg/m  O2 sats: stable throughout Complications: No apparent complications Patient did tolerate procedure well Tracheostomy Brand:Shiley Tracheostomy Style:Uncuffed Tracheostomy Size: 6 Tracheostomy Secured WGN:[id number] Tracheostomy Placement Confirmation:    [name] 05/29/2023, 10:35 AM

## 2023-05-29 NOTE — ED Notes (Signed)
Patient transported to CT 

## 2023-05-29 NOTE — ED Triage Notes (Signed)
Pt was at home attempting to take off his cap to his trach and accidentally pulled it out. [name] is inner cannula 6.10mm - cuffless placed on the 24th of this month. 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

## 2023-05-29 NOTE — ED Notes (Signed)
EDP at bedside during triage. RT placed new trach without issues.

## 2023-05-29 NOTE — ED Provider Notes (Signed)
[place] EMERGENCY DEPARTMENT AT [facility] Provider Note   CSN: [id number] Arrival date & time: 05/29/23  1014     History  No chief complaint on file.   Jacob [name] is a 72 y.o. male.  He is brought in by EMS from home after accidentally dislodging his tracheostomy this morning.  He had a trach placed few weeks ago with tongue resection, head neck cancer.  He has no other complaints other than dislodged trach.  No hemoptysis no fever.  The history is provided by the patient, the EMS personnel and the spouse.       Home Medications Prior to Admission medications   Medication Sig Start Date End Date Taking? Authorizing Provider  acetaminophen (TYLENOL) 500 MG tablet Take 1,000 mg by mouth every 6 (six) hours as needed for mild pain or headache.    [provider]  allopurinol (ZYLOPRIM) 300 MG tablet TAKE 1 TABLET BY MOUTH ONCE A DAY. 01/08/21   Croitoru, Mihai, MD  cholecalciferol (VITAMIN D3) 25 MCG (1000 UNIT) tablet Take 1,000 Units by mouth daily.    [provider]  gabapentin (NEURONTIN) 100 MG capsule Take 100 mg by mouth at bedtime. 04/22/23 04/21/24  [provider]  metFORMIN (GLUCOPHAGE) 1000 MG tablet Take 1,000 mg by mouth daily with breakfast. 08/06/22   [provider]  rosuvastatin (CRESTOR) 20 MG tablet Take 20 mg by mouth daily. 06/23/22   [provider]  sitaGLIPtin (JANUVIA) 100 MG tablet Take 100 mg by mouth daily.    [provider]  torsemide (DEMADEX) 20 MG tablet Take 1-2 tablets (20-40 mg total) by mouth daily. Patient taking differently: Take 20 mg by mouth daily. 06/22/20   Abelino Derrick, PA-C  traMADol (ULTRAM) 50 MG tablet Take 1 tablet (50 mg total) by mouth every 6 (six) hours as needed. 04/13/23   Jaci Standard, MD  warfarin (COUMADIN) 5 MG tablet TAKE (1 & 1/2) TABLET BY MOUTH ONCE DAILY OR AS DIRECTED 02/05/23   Croitoru, Rachelle Hora, MD      Allergies    Atenolol, Cardizem [diltiazem  hcl], Melatonin, Metoprolol, and Clindamycin/lincomycin    Review of Systems   Review of Systems  Respiratory:  Positive for cough. Negative for shortness of breath.   Cardiovascular:  Negative for chest pain.    Physical Exam Updated Vital Signs Temp 97.6 F (36.4 C) (Axillary)   Resp 19   Ht 6' (1.829 m)   Wt 71.2 kg   BMI 21.29 kg/m  Physical Exam Vitals and nursing note reviewed.  Constitutional:      General: He is not in acute distress.    Appearance: Normal appearance. He is well-developed.  HENT:     Head: Normocephalic and atraumatic.  Eyes:     Conjunctiva/sclera: Conjunctivae normal.  Neck:     Comments: He has a tracheostomy midline with some suture lines that go down through the stoma.  There is no crepitus. Cardiovascular:     Rate and Rhythm: Normal rate and regular rhythm.     Heart sounds: No murmur heard. Pulmonary:     Effort: Pulmonary effort is normal. No respiratory distress.     Breath sounds: Normal breath sounds.  Abdominal:     Palpations: Abdomen is soft.     Tenderness: There is no abdominal tenderness. There is no guarding or rebound.     Comments: PEG tube in place no surrounding erythema  Musculoskeletal:     Cervical  back: Neck supple.  Skin:    General: Skin is warm and dry.     Capillary Refill: Capillary refill takes less than 2 seconds.  Neurological:     General: No focal deficit present.     Mental Status: He is alert.     ED Results / Procedures / Treatments   Labs (all labs ordered are listed, but only abnormal results are displayed) Labs Reviewed - No data to display  EKG None  Radiology No results found.  Procedures TRACHEOSTOMY REPLACEMENT  Date/Time: 05/29/2023 5:27 PM  Performed by: Terrilee Files, MD Authorized by: Terrilee Files, MD  Consent: Verbal consent obtained. Consent given by: patient Patient understanding: patient states understanding of the procedure being performed Patient identity  confirmed: verbally with patient Indications: became dislodged Local anesthesia used: no  Anesthesia: Local anesthesia used: no  Sedation: Patient sedated: no  Tube type: single cannula Tube cuff: single cuff Cuff inflation: deflated Cuff type: air Cuff inflation technique: minimal leak technique used Patient tolerance: patient tolerated the procedure well with no immediate complications       Medications Ordered in ED Medications - No data to display  ED Course/ Medical Decision Making/ A&P Clinical Course as of 05/29/23 1727  Fri May 29, 2023  1038 Trach was replaced by a respiratory therapist at the bedside.  I was present for the entire exchange and patient tolerated well. [MB]    Clinical Course User Index [MB] Terrilee Files, MD                             Medical Decision Making Amount and/or Complexity of Data Reviewed Radiology: ordered.   This patient complains of trach tube displacement; this involves an extensive number of treatment Options and is a complaint that carries with it a high risk of complications and morbidity. The differential includes obstruction, respiratory distress  I ordered imaging studies which included chest x-ray and CT renal and I independently    visualized and interpreted imaging which showed small amount of free air under the diaphragm consistent with prior recent PEG tube Additional history obtained from patient's wife Previous records obtained and reviewed in epic including recent discharge summary I consulted respiratory therapist and discussed lab and imaging findings and discussed disposition.  Cardiac monitoring reviewed, normal sinus rhythm Social determinants considered, no significant barriers Critical Interventions: Assistance with placement of tracheostomy tube  After the interventions stated above, I reevaluated the patient and found patient to be back to baseline symptomatically Admission and further testing  considered, no indications for admission or further workup at this time.  Patient will be discharged and recommended close follow-up with his treatment team.  Return instructions discussed         Final Clinical Impression(s) / ED Diagnoses Final diagnoses:  Complication of tracheostomy tube [facility])    Rx / DC Orders ED Discharge Orders     None         Terrilee Files, MD 05/29/23 1729

## 2023-06-01 ENCOUNTER — Encounter (HOSPITAL_COMMUNITY): Payer: Self-pay

## 2023-06-01 ENCOUNTER — Emergency Department (HOSPITAL_COMMUNITY): Payer: PPO

## 2023-06-01 ENCOUNTER — Emergency Department (HOSPITAL_COMMUNITY)
Admission: EM | Admit: 2023-06-01 | Discharge: 2023-06-02 | Payer: PPO | Attending: Emergency Medicine | Admitting: Emergency Medicine

## 2023-06-01 ENCOUNTER — Other Ambulatory Visit: Payer: Self-pay

## 2023-06-01 DIAGNOSIS — Z7984 Long term (current) use of oral hypoglycemic drugs: Secondary | ICD-10-CM | POA: Diagnosis not present

## 2023-06-01 DIAGNOSIS — Z85810 Personal history of malignant neoplasm of tongue: Secondary | ICD-10-CM | POA: Diagnosis not present

## 2023-06-01 DIAGNOSIS — Z7901 Long term (current) use of anticoagulants: Secondary | ICD-10-CM | POA: Insufficient documentation

## 2023-06-01 DIAGNOSIS — R000 Tachycardia, unspecified: Secondary | ICD-10-CM | POA: Diagnosis not present

## 2023-06-01 DIAGNOSIS — L7681 Other intraoperative complications of skin and subcutaneous tissue: Secondary | ICD-10-CM | POA: Insufficient documentation

## 2023-06-01 DIAGNOSIS — R531 Weakness: Secondary | ICD-10-CM | POA: Insufficient documentation

## 2023-06-01 DIAGNOSIS — R58 Hemorrhage, not elsewhere classified: Secondary | ICD-10-CM | POA: Diagnosis not present

## 2023-06-01 DIAGNOSIS — T85838A Hemorrhage due to other internal prosthetic devices, implants and grafts, initial encounter: Secondary | ICD-10-CM | POA: Diagnosis not present

## 2023-06-01 DIAGNOSIS — R0689 Other abnormalities of breathing: Secondary | ICD-10-CM | POA: Diagnosis not present

## 2023-06-01 DIAGNOSIS — I700 Atherosclerosis of aorta: Secondary | ICD-10-CM | POA: Diagnosis not present

## 2023-06-01 DIAGNOSIS — K140 Glossitis: Secondary | ICD-10-CM | POA: Diagnosis not present

## 2023-06-01 DIAGNOSIS — T8149XA Infection following a procedure, other surgical site, initial encounter: Secondary | ICD-10-CM | POA: Diagnosis not present

## 2023-06-01 DIAGNOSIS — K9421 Gastrostomy hemorrhage: Secondary | ICD-10-CM | POA: Diagnosis not present

## 2023-06-01 DIAGNOSIS — L0291 Cutaneous abscess, unspecified: Secondary | ICD-10-CM

## 2023-06-01 DIAGNOSIS — Z5321 Procedure and treatment not carried out due to patient leaving prior to being seen by health care provider: Secondary | ICD-10-CM | POA: Diagnosis not present

## 2023-06-01 DIAGNOSIS — Z91040 Latex allergy status: Secondary | ICD-10-CM | POA: Diagnosis not present

## 2023-06-01 DIAGNOSIS — R609 Edema, unspecified: Secondary | ICD-10-CM | POA: Diagnosis not present

## 2023-06-01 DIAGNOSIS — L7682 Other postprocedural complications of skin and subcutaneous tissue: Secondary | ICD-10-CM

## 2023-06-01 LAB — CBC WITH DIFFERENTIAL/PLATELET
Abs Immature Granulocytes: 0.04 10*3/uL (ref 0.00–0.07)
Basophils Absolute: 0.1 10*3/uL (ref 0.0–0.1)
Basophils Relative: 1 %
Eosinophils Absolute: 0.1 10*3/uL (ref 0.0–0.5)
Eosinophils Relative: 1 %
HCT: 36.5 % — ABNORMAL LOW (ref 39.0–52.0)
Hemoglobin: 11.8 g/dL — ABNORMAL LOW (ref 13.0–17.0)
Immature Granulocytes: 0 %
Lymphocytes Relative: 11 %
Lymphs Abs: 1.1 10*3/uL (ref 0.7–4.0)
MCH: 28.4 pg (ref 26.0–34.0)
MCHC: 32.3 g/dL (ref 30.0–36.0)
MCV: 88 fL (ref 80.0–100.0)
Monocytes Absolute: 1.3 10*3/uL — ABNORMAL HIGH (ref 0.1–1.0)
Monocytes Relative: 13 %
Neutro Abs: 7.7 10*3/uL (ref 1.7–7.7)
Neutrophils Relative %: 74 %
Platelets: 451 10*3/uL — ABNORMAL HIGH (ref 150–400)
RBC: 4.15 MIL/uL — ABNORMAL LOW (ref 4.22–5.81)
RDW: 13.6 % (ref 11.5–15.5)
WBC: 10.3 10*3/uL (ref 4.0–10.5)
nRBC: 0 % (ref 0.0–0.2)

## 2023-06-01 LAB — COMPREHENSIVE METABOLIC PANEL
ALT: 45 U/L — ABNORMAL HIGH (ref 0–44)
AST: 31 U/L (ref 15–41)
Albumin: 3.1 g/dL — ABNORMAL LOW (ref 3.5–5.0)
Alkaline Phosphatase: 75 U/L (ref 38–126)
Anion gap: 16 — ABNORMAL HIGH (ref 5–15)
BUN: 49 mg/dL — ABNORMAL HIGH (ref 8–23)
CO2: 29 mmol/L (ref 22–32)
Calcium: 9.4 mg/dL (ref 8.9–10.3)
Chloride: 95 mmol/L — ABNORMAL LOW (ref 98–111)
Creatinine, Ser: 1.03 mg/dL (ref 0.61–1.24)
GFR, Estimated: >60 mL/min (ref >60)
Glucose, Bld: 411 mg/dL — ABNORMAL HIGH (ref 70–99)
Potassium: 4.2 mmol/L (ref 3.5–5.1)
Sodium: 140 mmol/L (ref 135–145)
Total Bilirubin: 0.5 mg/dL (ref 0.3–1.2)
Total Protein: 7.1 g/dL (ref 6.5–8.1)

## 2023-06-01 LAB — LACTIC ACID, PLASMA: Lactic Acid, Venous: 2.8 mmol/L (ref 0.5–1.9)

## 2023-06-01 MED ORDER — IOHEXOL 300 MG/ML  SOLN
100.0000 mL | Freq: Once | INTRAMUSCULAR | Status: AC | PRN
  Administered 2023-06-01: 100 mL via INTRAVENOUS

## 2023-06-01 MED ORDER — PIPERACILLIN-TAZOBACTAM 3.375 G IVPB 30 MIN
3.3750 g | Freq: Once | INTRAVENOUS | Status: AC
  Administered 2023-06-01: 3.375 g via INTRAVENOUS
  Filled 2023-06-01: qty 50

## 2023-06-01 NOTE — ED Notes (Signed)
Called to transport pt. to [facility] per PA.

## 2023-06-01 NOTE — ED Triage Notes (Signed)
Patient arrives from home with bleeding from his peg tube. Patient had abdominal surgery in May. Today the site began leaking blood and has a foul odor. Surgery was performed at [facility] with Dr Windy Kalata and Dr Christoper Allegra. Patient also had surgery on his tongue the same day. Wife reports he is more weak and has bloody mucus coming from his mouth. Trach patient.

## 2023-06-02 DIAGNOSIS — Z91018 Allergy to other foods: Secondary | ICD-10-CM | POA: Diagnosis not present

## 2023-06-02 DIAGNOSIS — K9422 Gastrostomy infection: Secondary | ICD-10-CM | POA: Diagnosis not present

## 2023-06-02 DIAGNOSIS — K140 Glossitis: Secondary | ICD-10-CM | POA: Diagnosis not present

## 2023-06-02 DIAGNOSIS — Z85810 Personal history of malignant neoplasm of tongue: Secondary | ICD-10-CM | POA: Diagnosis not present

## 2023-06-02 DIAGNOSIS — Z7984 Long term (current) use of oral hypoglycemic drugs: Secondary | ICD-10-CM | POA: Diagnosis not present

## 2023-06-02 DIAGNOSIS — Z91040 Latex allergy status: Secondary | ICD-10-CM | POA: Diagnosis not present

## 2023-06-02 DIAGNOSIS — Z7982 Long term (current) use of aspirin: Secondary | ICD-10-CM | POA: Diagnosis not present

## 2023-06-02 DIAGNOSIS — Z833 Family history of diabetes mellitus: Secondary | ICD-10-CM | POA: Diagnosis not present

## 2023-06-02 DIAGNOSIS — I2510 Atherosclerotic heart disease of native coronary artery without angina pectoris: Secondary | ICD-10-CM | POA: Diagnosis not present

## 2023-06-02 DIAGNOSIS — K659 Peritonitis, unspecified: Secondary | ICD-10-CM | POA: Diagnosis not present

## 2023-06-02 DIAGNOSIS — Z4659 Encounter for fitting and adjustment of other gastrointestinal appliance and device: Secondary | ICD-10-CM | POA: Diagnosis not present

## 2023-06-02 DIAGNOSIS — Z7901 Long term (current) use of anticoagulants: Secondary | ICD-10-CM | POA: Diagnosis not present

## 2023-06-02 DIAGNOSIS — E785 Hyperlipidemia, unspecified: Secondary | ICD-10-CM | POA: Diagnosis not present

## 2023-06-02 DIAGNOSIS — R531 Weakness: Secondary | ICD-10-CM | POA: Diagnosis not present

## 2023-06-02 DIAGNOSIS — Z888 Allergy status to other drugs, medicaments and biological substances status: Secondary | ICD-10-CM | POA: Diagnosis not present

## 2023-06-02 DIAGNOSIS — L7681 Other intraoperative complications of skin and subcutaneous tissue: Secondary | ICD-10-CM | POA: Diagnosis not present

## 2023-06-02 DIAGNOSIS — I4891 Unspecified atrial fibrillation: Secondary | ICD-10-CM | POA: Diagnosis not present

## 2023-06-02 DIAGNOSIS — E11628 Type 2 diabetes mellitus with other skin complications: Secondary | ICD-10-CM | POA: Diagnosis not present

## 2023-06-02 DIAGNOSIS — I10 Essential (primary) hypertension: Secondary | ICD-10-CM | POA: Diagnosis not present

## 2023-06-02 DIAGNOSIS — L03311 Cellulitis of abdominal wall: Secondary | ICD-10-CM | POA: Diagnosis not present

## 2023-06-02 DIAGNOSIS — E1165 Type 2 diabetes mellitus with hyperglycemia: Secondary | ICD-10-CM | POA: Diagnosis not present

## 2023-06-02 DIAGNOSIS — Z930 Tracheostomy status: Secondary | ICD-10-CM | POA: Diagnosis not present

## 2023-06-02 DIAGNOSIS — Z881 Allergy status to other antibiotic agents status: Secondary | ICD-10-CM | POA: Diagnosis not present

## 2023-06-02 LAB — CBG MONITORING, ED: Glucose-Capillary: 275 mg/dL — ABNORMAL HIGH (ref 70–99)

## 2023-06-02 MED ORDER — TRAMADOL HCL 50 MG PO TABS: 50.0000 mg | ORAL_TABLET | Freq: Four times a day (QID) | ORAL | Status: DC | PRN

## 2023-06-02 MED ORDER — ALLOPURINOL 300 MG PO TABS: 300.0000 mg | ORAL_TABLET | Freq: Every day | ORAL | Status: DC

## 2023-06-02 MED ORDER — PIOGLITAZONE HCL 15 MG PO TABS: 15.0000 mg | ORAL_TABLET | Freq: Every day | ORAL | Status: DC

## 2023-06-02 MED ORDER — PANTOPRAZOLE SODIUM 40 MG PO TBEC: 40.0000 mg | DELAYED_RELEASE_TABLET | Freq: Every day | ORAL | Status: DC

## 2023-06-02 MED ORDER — METFORMIN HCL 500 MG PO TABS: 1000.0000 mg | ORAL_TABLET | Freq: Every day | ORAL | Status: DC

## 2023-06-02 MED ORDER — WARFARIN SODIUM 7.5 MG PO TABS: 7.5000 mg | ORAL_TABLET | Freq: Every day | ORAL | Status: DC

## 2023-06-02 MED ORDER — LINAGLIPTIN 5 MG PO TABS: 5.0000 mg | ORAL_TABLET | Freq: Every day | ORAL | Status: DC

## 2023-06-02 MED ORDER — TORSEMIDE 20 MG PO TABS: 20.0000 mg | ORAL_TABLET | Freq: Every day | ORAL | Status: DC

## 2023-06-02 MED ORDER — ACETAMINOPHEN 500 MG PO TABS: 1000.0000 mg | ORAL_TABLET | Freq: Two times a day (BID) | ORAL | Status: DC | PRN

## 2023-06-02 MED ORDER — ROSUVASTATIN CALCIUM 20 MG PO TABS: 20.0000 mg | ORAL_TABLET | Freq: Every day | ORAL | Status: DC

## 2023-06-02 MED ORDER — SODIUM CHLORIDE 0.9 % IV BOLUS
1000.0000 mL | Freq: Once | INTRAVENOUS | Status: AC
  Administered 2023-06-02: 1000 mL via INTRAVENOUS

## 2023-06-02 MED ORDER — VITAMIN D 25 MCG (1000 UNIT) PO TABS: 1000.0000 [IU] | ORAL_TABLET | Freq: Every day | ORAL | Status: DC

## 2023-06-02 MED ORDER — GABAPENTIN 100 MG PO CAPS: 100.0000 mg | ORAL_CAPSULE | Freq: Three times a day (TID) | ORAL | Status: DC

## 2023-06-02 NOTE — ED Provider Notes (Signed)
[place] EMERGENCY DEPARTMENT AT [facility] Provider Note   CSN: [id number] Arrival date & time: 06/01/23  2059     History  Chief Complaint  Patient presents with   Post-op Problem    Jacob [name] is a 72 y.o. male with PMHx tongue cancer s/p resection surgery and peg tube placement on 5/29 who presents to ED concerned with bleeding and foul odor from peg tube site. Peg tube concerns have been progressing over the past week. Patient also complaining of weakness.  Patient also told nurse about tongue bleeding - but denied this when I asked about tongue bleeding.  Denies fevers, chest pain, dyspnea  HPI     Home Medications Prior to Admission medications   Medication Sig Start Date End Date Taking? Authorizing Provider  acetaminophen (TYLENOL) 500 MG tablet Take 1,000 mg by mouth 2 (two) times daily as needed for mild pain or headache.   Yes [provider]  allopurinol (ZYLOPRIM) 300 MG tablet TAKE 1 TABLET BY MOUTH ONCE A DAY. 01/08/21  Yes Croitoru, Mihai, MD  gabapentin (NEURONTIN) 100 MG capsule Take 100 mg by mouth 3 (three) times daily. 04/22/23 04/21/24 Yes [provider]  metFORMIN (GLUCOPHAGE) 1000 MG tablet Take 1,000 mg by mouth daily with breakfast. 08/06/22  Yes [provider]  omeprazole (PRILOSEC) 20 MG capsule Take 20 mg by mouth daily. 05/23/23 06/22/23 Yes [provider]  pioglitazone (ACTOS) 15 MG tablet Take 15 mg by mouth daily.   Yes [provider]  rosuvastatin (CRESTOR) 20 MG tablet Take 20 mg by mouth daily. 06/23/22  Yes [provider]  sitaGLIPtin (JANUVIA) 100 MG tablet Take 100 mg by mouth daily.   Yes [provider]  torsemide (DEMADEX) 20 MG tablet Take 1-2 tablets (20-40 mg total) by mouth daily. Patient taking differently: Take 20 mg by mouth daily. 06/22/20  Yes Kilroy, Luke K, PA-C  traMADol (ULTRAM) 50 MG tablet Take 1 tablet (50 mg total) by mouth every 6 (six) hours as  needed. Patient taking differently: Take 50 mg by mouth every 6 (six) hours as needed for moderate pain or severe pain. 04/13/23  Yes Jaci Standard, MD  warfarin (COUMADIN) 5 MG tablet TAKE (1 & 1/2) TABLET BY MOUTH ONCE DAILY OR AS DIRECTED Patient taking differently: Take 7.5 mg by mouth daily. TAKE (1 & 1/2) TABLETS BY MOUTH ONCE DAILY OR AS DIRECTED 02/05/23  Yes Croitoru, Mihai, MD  cholecalciferol (VITAMIN D3) 25 MCG (1000 UNIT) tablet Take 1,000 Units by mouth daily.    [provider]      Allergies    Ace inhibitors, Atenolol, Cardizem [diltiazem hcl], Latex, Melatonin, Metoprolol, and Clindamycin/lincomycin    Review of Systems   Review of Systems  Gastrointestinal:        Peg tube bleeding    Physical Exam Updated Vital Signs BP (!) 141/90 (BP Location: Right Arm)   Pulse (!) 112   Temp (!) 97.1 F (36.2 C) (Axillary)   Resp 17   Ht 6\' 1"  (1.854 m)   Wt 69.4 kg   SpO2 100%   BMI 20.19 kg/m  Physical Exam Vitals and nursing note reviewed.  Constitutional:      General: He is not in acute distress. HENT:     Head: Normocephalic and atraumatic.     Mouth/Throat:     Mouth: Mucous membranes are moist.     Pharynx: No oropharyngeal exudate or posterior oropharyngeal erythema.  Eyes:  General: No scleral icterus.       Right eye: No discharge.        Left eye: No discharge.     Conjunctiva/sclera: Conjunctivae normal.  Cardiovascular:     Rate and Rhythm: Normal rate and regular rhythm.     Pulses: Normal pulses.     Heart sounds: Normal heart sounds. No murmur heard. Pulmonary:     Effort: Pulmonary effort is normal.  Abdominal:     Comments: Peg tube intact with surrounding erythema and purulence expressed when pressing down around peg tube site.  Skin:    General: Skin is warm and dry.     Findings: No rash.  Neurological:     General: No focal deficit present.     Mental Status: He is alert. Mental status is at baseline.  Psychiatric:         Mood and Affect: Mood normal.     ED Results / Procedures / Treatments   Labs (all labs ordered are listed, but only abnormal results are displayed) Labs Reviewed  LACTIC ACID, PLASMA - Abnormal; Notable for the following components:      Result Value   Lactic Acid, Venous 2.8 (*)    All other components within normal limits  COMPREHENSIVE METABOLIC PANEL - Abnormal; Notable for the following components:   Chloride 95 (*)    Glucose, Bld 411 (*)    BUN 49 (*)    Albumin 3.1 (*)    ALT 45 (*)    Anion gap 16 (*)    All other components within normal limits  CBC WITH DIFFERENTIAL/PLATELET - Abnormal; Notable for the following components:   RBC 4.15 (*)    Hemoglobin 11.8 (*)    HCT 36.5 (*)    Platelets 451 (*)    Monocytes Absolute 1.3 (*)    All other components within normal limits  CULTURE, BLOOD (ROUTINE X 2)  CULTURE, BLOOD (ROUTINE X 2)  LACTIC ACID, PLASMA  URINALYSIS, ROUTINE W REFLEX MICROSCOPIC    EKG None  Radiology CT ABDOMEN PELVIS W CONTRAST  Result Date: 06/01/2023 CLINICAL DATA:  Provided history: Peritonitis or perforation suspected Bleeding around PEG. EXAM: CT ABDOMEN AND PELVIS WITH CONTRAST TECHNIQUE: Multidetector CT imaging of the abdomen and pelvis was performed using the standard protocol following bolus administration of intravenous contrast. RADIATION DOSE REDUCTION: This exam was performed according to the departmental dose-optimization program which includes automated exposure control, adjustment of the mA and/or kV according to patient size and/or use of iterative reconstruction technique. CONTRAST:  OMNIPAQUE IOHEXOL 300 MG/ML  SOLN COMPARISON:  Noncontrast CT 05/29/2023 FINDINGS: Lower chest: Linear subsegmental atelectasis in the right lower lobe. No pleural fluid. Punctate calcified granuloma left lower lobe, benign. Hepatobiliary: Tiny subcentimeter hypodensities in the right lobe of the liver. No suspicious liver lesion.  Decompressed gallbladder. No biliary dilatation. Pancreas: Parenchymal atrophy again seen. No ductal dilatation or inflammation. Spleen: Normal in size without focal abnormality. Adrenals/Urinary Tract: Normal adrenal glands. No hydronephrosis or perinephric edema. Homogeneous renal enhancement with symmetric excretion on delayed phase imaging. Urinary bladder is physiologically distended without wall thickening. Stomach/Bowel: Gastrostomy tube is in place, balloon positioned in the gastric lumen. Moderate amount of stranding, inflammation and air within the subcutaneous tissues surrounding the gastrostomy tube. The degree of inflammation is increased from prior exam. There is no discrete fluid collection. Small foci of free air in the upper abdomen persist, however the amount of free air has diminished from prior. The stomach  is overall nondistended. No small or large bowel inflammation. Small to moderate colonic stool burden. The appendix is normal. Vascular/Lymphatic: Aortic atherosclerosis. No aneurysm. The portal vein is patent. No bulky abdominopelvic adenopathy. Reproductive: Prostate is unremarkable. Other: Small foci of free air in the upper abdomen persist, however diminished from prior exam. Increased inflammation in the left upper abdominal wall soft tissues related to gastrostomy tube. Small amount of gas in the subcutaneous tissues about the right inguinal region, slightly improved. Musculoskeletal: Stable osseous structures. No acute osseous findings. IMPRESSION: 1. Gastrostomy tube is in place, balloon positioned in the gastric lumen. Increased inflammatory change about the gastrostomy tube in the subcutaneous tissues as well as slight increase in soft tissue gas adjacent to the tubing. No drainable subcutaneous fluid collection. 2. Small foci of free air in the upper abdomen persist, however the amount of free air has diminished from prior exam. This is likely post procedural. 3. Small amount of gas  in the subcutaneous tissues about the right inguinal region, slightly improved. Aortic Atherosclerosis (ICD10-I70.0). Electronically Signed   By: Narda Rutherford M.D.   On: 06/01/2023 22:54    Procedures Procedures    Medications Ordered in ED Medications  sodium chloride 0.9 % bolus 1,000 mL (has no administration in time range)  iohexol (OMNIPAQUE) 300 MG/ML solution 100 mL (100 mLs Intravenous Contrast Given 06/01/23 2239)  piperacillin-tazobactam (ZOSYN) IVPB 3.375 g (3.375 g Intravenous New Bag/Given 06/01/23 2342)    ED Course/ Medical Decision Making/ A&P                             Medical Decision Making Amount and/or Complexity of Data Reviewed Labs: ordered. Radiology: ordered.  Risk Prescription drug management.   This patient presents to the ED for concern of peg tube infection, this involves an extensive number of treatment options, and is a complaint that carries with it a high risk of complications and morbidity.  The differential diagnosis includes cellulitis, abscess, other surgical complications   Co morbidities that complicate the patient evaluation  Peg tube     Lab Tests:  I Ordered, and personally interpreted labs.  The pertinent results include:   -LA: 2.8 -CMP: no concern for electrolyte abnormality -CBC: mild anemia; no leukocytosis -Blood cultures: pending    Imaging Studies ordered:  I ordered imaging studies including  -CT abdomen/pelvis:  1. Gastrostomy tube is in place, balloon positioned in the gastric lumen. Increased inflammatory change about the gastrostomy tube in the subcutaneous tissues as well as slight increase in soft tissue gas adjacent to the tubing. No drainable subcutaneous fluid collection. 2. Small foci of free air in the upper abdomen persist, however the amount of free air has diminished from prior exam. This is likely post procedural. 3. Small amount of gas in the subcutaneous tissues about the right inguinal region,  slightly improved. I independently visualized and interpreted imaging I agree with the radiologist interpretation     Consultations Obtained:  I requested consultation with the Bariatric surgeon Dr. Barnetta Chapel with [facility],  and discussed lab and imaging findings as well as pertinent plan - they agree to admit patient   Problem List / ED Course / Critical interventions / Medication management  Patient presents to ED concerned about peg tube discharge. Physical exam shows purulence when pressing around peg tube site.  CT scan concerning for soft tissue infection around PEG tube.  Lactic acid 2.8.  Blood cultures pending.  Provided patient with Zosyn and fluids in ED. Given the patient's PEG tube infection, increased lactic acid, and tachycardia -believe he would greatly benefit from inpatient admission at this time.  Patient's surgeons were at [facility].  Talked with bariatric surgeon Dr. Barnetta Chapel who agreed to admit patient.  Patient at first requested to stay at [facility] due to the location of their house, but then stated that they be fine with transferring over to [facility] since this is where their surgeons are located. Talked with transfer center who states that have bed available for patient. I have reviewed the patients home medicines and have made adjustments as needed  12:47 AM Care of GERARD SUDDITH transferred to [employer] at the end of my shift as the patient will require reassessment once labs/imaging have resulted. Patient presentation, ED course, and plan of care discussed with review of all pertinent labs and imaging. Please see his/her note for further details regarding further ED course and disposition. Plan at time of handoff is transfer patient to [facility] for inpatient admission. This may be altered or completely changed at the discretion of the oncoming team pending results of further workup.    Social Determinants of Health:  none          Final  Clinical Impression(s) / ED Diagnoses Final diagnoses:  Abscess  Other postoperative complication of skin    Rx / DC Orders ED Discharge Orders     None         Margarita Rana 06/02/23 0047    Benjiman Core, MD 06/02/23 2328

## 2023-06-02 NOTE — ED Notes (Signed)
Pt was taken off the monitor for him to take care of his hygenic needs and use of urinal.

## 2023-06-02 NOTE — ED Notes (Signed)
Report given to [facility] 

## 2023-06-02 NOTE — ED Provider Notes (Signed)
Plan for transfer to [facility] for continued treatment of infection around PEG site. Recent surgeries at [facility]. [facility] accepting. Stable for transfer.    Alvira Monday, MD 06/02/23 [phone]

## 2023-06-02 NOTE — ED Notes (Signed)
ED TO INPATIENT HANDOFF REPORT  ED Nurse Name and Phone #:   S Name/Age/Gender Jacob [name] 72 y.o. male Room/Bed: WA14/WA14  Code Status   Code Status: Not on file  Home/SNF/Other Home Patient oriented to: self, place, time, and situation Is this baseline? Yes   Triage Complete: Triage complete  Chief Complaint post op problem, cancer pt  Triage Note Patient arrives from home with bleeding from his peg tube. Patient had abdominal surgery in May. Today the site began leaking blood and has a foul odor. Surgery was performed at [facility] with Dr Windy Kalata and Dr Christoper Allegra. Patient also had surgery on his tongue the same day. Wife reports he is more weak and has bloody mucus coming from his mouth. Trach patient.    Allergies Allergies  Allergen Reactions   Ace Inhibitors    Atenolol Other (See Comments)    Personality changes   Cardizem [Diltiazem Hcl]     Pt unsure of reaction   Latex    Melatonin     May have caused depression and anxiety   Metoprolol Other (See Comments)    Personality changes   Clindamycin/Lincomycin Other (See Comments)    Acid refux    Level of Care/Admitting Diagnosis ED Disposition     ED Disposition  Transfer via Transport   Condition  --   Comment  To [facility]  The patient appears reasonably stabilized for transfer considering the current resources, flow, and capabilities available in the ED at this time, and I doubt any other [facility] requiring further screening and/or treatment in the ED prior to  transfer is present.          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 one vessel disease,severe MR   CARDIAC CATHETERIZATION  01/19/2006   normal   CARDIAC CATHETERIZATION  06/06/2010   mild nonobstructive CAD   COLONOSCOPY WITH PROPOFOL N/A 09/23/2021   Procedure: COLONOSCOPY WITH PROPOFOL;  Surgeon: Lanelle Bal, DO;  Location: [facility];  Service: Endoscopy;  Laterality: N/A;  9:15am   cyst  1980   removed from wrist   ESOPHAGOGASTRODUODENOSCOPY (EGD) WITH PROPOFOL N/A 09/23/2021   Procedure: ESOPHAGOGASTRODUODENOSCOPY (EGD) WITH PROPOFOL;  Surgeon: Lanelle Bal, DO;  Location: [facility];  Service: Endoscopy;  Laterality: N/A;   MITRAL VALVE SURGERY  12/11/2004   MV repair with Randa Evens ring,closure of PFO   POLYPECTOMY  09/23/2021   Procedure: POLYPECTOMY;  Surgeon: Lanelle Bal, DO;  Location: [facility];  Service: Endoscopy;;   RADIOFREQUENCY ABLATION  12/02/2004   Dr. Monna Fam   SALIVARY GLAND SURGERY  2004   growth  tongue cancer  05/15/2023   TONSILLECTOMY  1959     A IV Location/Drains/Wounds Patient Lines/Drains/Airways Status     Active Line/Drains/Airways     Name Placement date Placement time Site Days   Peripheral IV 06/01/23 20 G 1" Anterior;Proximal;Right Forearm 06/01/23  2149  Forearm  1            Intake/Output Last 24 hours No intake or output data in the 24 hours ending 06/02/23 0145  Labs/Imaging Results for orders placed or performed during the hospital encounter of 06/01/23 (from the past 48 hour(s))  Lactic acid, plasma     Status: Abnormal   Collection Time: 06/01/23  9:46 PM  Result  Value Ref Range   Lactic Acid, Venous 2.8 (HH) 0.5 - 1.9 mmol/L    Comment: CRITICAL RESULT CALLED TO, READ BACK BY AND VERIFIED WITH METCALF,A RN @ 2218 06/01/23 BY CHILDRESS,E Performed at [facility], [address] [place], [place] [address]   Comprehensive metabolic panel     Status: Abnormal   Collection Time: 06/01/23  9:46 PM  Result Value Ref Range   Sodium 140 135 - 145 mmol/L   Potassium 4.2 3.5 - 5.1 mmol/L   Chloride 95 (L) 98 - 111 mmol/L   CO2 29 22 - 32 mmol/L   Glucose, Bld 411 (H) 70 - 99 mg/dL    Comment: Glucose reference range applies only to samples taken after fasting for at least 8 hours.   BUN 49 (H) 8 - 23 mg/dL   Creatinine, Ser 8.46 0.61 - 1.24 mg/dL   Calcium 9.4 8.9 - 96.2 mg/dL   Total Protein 7.1 6.5 - 8.1 g/dL   Albumin 3.1 (L) 3.5 - 5.0 g/dL   AST 31 15 - 41 U/L   ALT 45 (H) 0 - 44 U/L   Alkaline Phosphatase 75 38 - 126 U/L   Total Bilirubin 0.5 0.3 - 1.2 mg/dL   GFR, Estimated >95 >28 mL/min    Comment: (NOTE) Calculated using the CKD-EPI Creatinine Equation (2021)    Anion gap 16 (H) 5 - 15    Comment: Performed at [facility], [address], [place], [place] [address]  CBC with Differential     Status: Abnormal   Collection Time: 06/01/23  9:46 PM  Result Value Ref Range   WBC 10.3 4.0 - 10.5 K/uL   RBC 4.15 (L) 4.22 - 5.81 MIL/uL   Hemoglobin 11.8 (L) 13.0 - 17.0 g/dL   HCT 40.1 (L) 02.7 - 25.3 %   MCV 88.0 80.0 - 100.0 fL   MCH 28.4 26.0 - 34.0 pg   MCHC 32.3 30.0 - 36.0 g/dL   RDW 66.4 40.3 - 47.4 %   Platelets 451 (H) 150 - 400 K/uL   nRBC 0.0 0.0 - 0.2 %   Neutrophils Relative % 74 %   Neutro Abs 7.7 1.7 - 7.7 K/uL   Lymphocytes Relative 11 %   Lymphs Abs 1.1 0.7 - 4.0 K/uL   Monocytes Relative 13 %   Monocytes Absolute 1.3 (H) 0.1 - 1.0 K/uL   Eosinophils Relative 1 %   Eosinophils Absolute 0.1 0.0 - 0.5 K/uL   Basophils Relative 1 %   Basophils Absolute 0.1 0.0 - 0.1 K/uL    Immature Granulocytes 0 %   Abs Immature Granulocytes 0.04 0.00 - 0.07 K/uL    Comment: Performed at [facility], [address] [place], [place] [address]   CT  ABDOMEN PELVIS W CONTRAST  Result Date: 06/01/2023 CLINICAL DATA:  Provided history: Peritonitis or perforation suspected Bleeding around PEG. EXAM: CT ABDOMEN AND PELVIS WITH CONTRAST TECHNIQUE: Multidetector CT imaging of the abdomen and pelvis was performed using the standard protocol following bolus administration of intravenous contrast. RADIATION DOSE REDUCTION: This exam was performed according to the departmental dose-optimization program which includes automated exposure control, adjustment of the mA and/or kV according to patient size and/or use of iterative reconstruction technique. CONTRAST:  OMNIPAQUE IOHEXOL 300 MG/ML  SOLN COMPARISON:  Noncontrast CT 05/29/2023 FINDINGS: Lower chest: Linear subsegmental atelectasis in the right lower lobe. No pleural fluid. Punctate calcified granuloma left lower lobe, benign. Hepatobiliary: Tiny subcentimeter hypodensities in the right lobe of the liver. No suspicious liver lesion. Decompressed gallbladder. No biliary dilatation. Pancreas: Parenchymal atrophy again seen. No ductal dilatation or inflammation. Spleen: Normal in size without focal abnormality. Adrenals/Urinary Tract: Normal adrenal glands. No hydronephrosis or perinephric edema. Homogeneous renal enhancement with symmetric excretion on delayed phase imaging. Urinary bladder is physiologically distended without wall thickening. Stomach/Bowel: Gastrostomy tube is in place, balloon positioned in the gastric lumen. Moderate amount of stranding, inflammation and air within the subcutaneous tissues surrounding the gastrostomy tube. The degree of inflammation is increased from prior exam. There is no discrete fluid collection. Small foci of free air in the upper abdomen persist, however the amount of free air has  diminished from prior. The stomach is overall nondistended. No small or large bowel inflammation. Small to moderate colonic stool burden. The appendix is normal. Vascular/Lymphatic: Aortic atherosclerosis. No aneurysm. The portal vein is patent. No bulky abdominopelvic adenopathy. Reproductive: Prostate is unremarkable. Other: Small foci of free air in the upper abdomen persist, however diminished from prior exam. Increased inflammation in the left upper abdominal wall soft tissues related to gastrostomy tube. Small amount of gas in the subcutaneous tissues about the right inguinal region, slightly improved. Musculoskeletal: Stable osseous structures. No acute osseous findings. IMPRESSION: 1. Gastrostomy tube is in place, balloon positioned in the gastric lumen. Increased inflammatory change about the gastrostomy tube in the subcutaneous tissues as well as slight increase in soft tissue gas adjacent to the tubing. No drainable subcutaneous fluid collection. 2. Small foci of free air in the upper abdomen persist, however the amount of free air has diminished from prior exam. This is likely post procedural. 3. Small amount of gas in the subcutaneous tissues about the right inguinal region, slightly improved. Aortic Atherosclerosis (ICD10-I70.0). Electronically Signed   By: Narda Rutherford M.D.   On: 06/01/2023 22:54    Pending Labs Unresulted Labs (From admission, onward)     Start     Ordered   06/01/23 2115  Culture, blood (Routine x 2)  BLOOD CULTURE X 2,   R (with STAT occurrences)      06/01/23 2115   06/01/23 2113  Lactic acid, plasma  Now then every 2 hours,   R (with STAT occurrences)      06/01/23 2113   06/01/23 2113  Urinalysis, Routine w reflex microscopic -Urine, Clean Catch  Once,   URGENT       Question:  Specimen Source  Answer:  Urine, Clean Catch   06/01/23 2113            Vitals/Pain Today's Vitals   06/02/23 0000 06/02/23 0030 06/02/23 0100 06/02/23 0130  BP: 124/72 129/86  123/67 134/79  Pulse: (!) 105 (!) 110 (!) 107 (!) 111  Resp: 19  Temp:      TempSrc:      SpO2: 98% 99% 99% 97%  Weight:      Height:      PainSc:        Isolation Precautions No active isolations  Medications Medications  iohexol (OMNIPAQUE) 300 MG/ML solution 100 mL (100 mLs Intravenous Contrast Given 06/01/23 2239)  piperacillin-tazobactam (ZOSYN) IVPB 3.375 g (0 g Intravenous Stopped 06/02/23 0131)  sodium chloride 0.9 % bolus 1,000 mL (1,000 mLs Intravenous New Bag/Given 06/02/23 0139)    Mobility walks     Focused Assessments    R Recommendations: See Admitting Provider Note  Report given to:   Additional Notes:

## 2023-06-02 NOTE — ED Notes (Signed)
[facility] called. 

## 2023-06-04 LAB — CULTURE, BLOOD (ROUTINE X 2)
Culture: NO GROWTH
Special Requests: ADEQUATE

## 2023-06-07 LAB — CULTURE, BLOOD (ROUTINE X 2)

## 2023-06-10 DIAGNOSIS — R1312 Dysphagia, oropharyngeal phase: Secondary | ICD-10-CM | POA: Diagnosis not present

## 2023-06-10 DIAGNOSIS — C029 Malignant neoplasm of tongue, unspecified: Secondary | ICD-10-CM | POA: Diagnosis not present

## 2023-06-10 DIAGNOSIS — Z7901 Long term (current) use of anticoagulants: Secondary | ICD-10-CM | POA: Diagnosis not present

## 2023-06-10 NOTE — Progress Notes (Signed)
Head and Neck Cancer Location of Tumor / Histology:  tongue mass (left lateral border of tongue)   CT SOFT TISSUE NECK W CONTRAST 04/30/2023  IMPRESSION: 1. The patient's known left-sided tongue cancer is not well delineated on CT, but there is a possible area of masslike hyperenhancement on the left in the oral cavity. 2. Enlarged left level 2A lymph node, with areas of low-density, concerning for necrosis, concerning for metastatic disease. 3. Asymmetric and somewhat prominent left level 3 lymph node, which did not demonstrate abnormal metabolic activity on the prior PET-CT. Attention on follow-up.  Patient presented with symptoms of:  (Per. Dr. Ellery Plunk consult note on 04-02-23)    Biopsies revealed:    Nutrition Status Yes No Comments  Weight changes? [x]  []  Wt Readings from Last 3 Encounters:  06/19/23 155 lb 3.2 oz (70.4 kg)  06/01/23 153 lb (69.4 kg)  05/29/23 157 lb (71.2 kg)   Has noticed weight loss  Swallowing concerns? [x]  []  Drinking water and coffee and bites of applesauce, plan is to slowly increase intake with swallowing studies  PEG? [x]  []  Placed on 05-20-23, glucerna four times a day (12oz containers)    Referrals Yes No Comments  Social Work? [x]  []    Dentistry? [x]  []  No areas of concern  Swallowing therapy? [x]  []  Yes working with SLP  Nutrition? [x]  []    Med/Onc? [x]  []     Safety Issues Yes No Comments  Prior radiation? []  [x]    Pacemaker/ICD? []  [x]    Possible current pregnancy? []  [x]    Is the patient on methotrexate? []  [x]     Tobacco/Marijuana/Snuff/ETOH use: He quit smoking in 1985   Past/Anticipated Interventions by otolaryngology, if any:  Head & Neck Surgery Follow-up Clinic Visit ID:  DIAGNOSIS: SCC TONGUE   05/15/2023 DR. WALTONEN PROCEDURES  1. Tracheotomy 2. Left neck dissection, levels 1-3 3. Right neck dissection levels 1-3 4. Anterior hemi-glossectomy 5. Dobhoff tube placement.   DR.PATWA PROCEDURES: 1. Harvest left radial  forearm free flap for reconstruction of oral tongue and floor of mouth (CPT 15757) 2. Microvascular anastomosis of: A. Left median antecubital vein #1 to right branch of IJV with a 4-0 coupler B. Left median antecubital vein #2 to right ranine vein with a 2.5 coupler C. Right facial artery to left radial artery  3. Split-thickness skin graft resurfacing of left forearm skin paddle defect measuring 11 cm x 10 cm ( 110 cm squared) ( CPT 15120, CPT 15121) 4. Wound vac placement 5. Neurorrhaphy of lateral cutaneous antebrachial nerve to right lingual nerve Subjective: [name] is a 72 y.o. male seen in a surgery follow up after undergoing the above surgery. He seems to be doing fairly well. His wife is caring for his wounds. Daily xeroform gauze to the left forearm graft. He was discharged on 05/23/2023. He had a gtube placed after his 05/15/2023 surgery. He tells me he is always hungry. Taking all nutrition via gtube. Has lost a few pounds since he went home.   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   Plan:   Appears to be doing very well.  Continue with dressing changes to his forearm graft. Supplies provided.  Will message Jacob [name] regarding his oral diet and ask her to reach out to Jacob [name] regarding increasing his diet.  He will follow up with Dr. Hezzie [name] with a FEES on 06/09/2022.  Instructed to call for any questions or concerns.  Past/Anticipated interventions by medical oncology, if any:  Jacob [name], Jacob [name]  04/07/2023  On review of the previous records, Jacob [name] was evaluated by a oral and maxillofacial surgeon on March 28th, 2024 to evaluate a lesion on the left lateral border of his tongue that was noted in March 2024. 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    On exam today, Jacob [name] reports that the lesion on his  tongue is painful but do not bleed or interfere with his ability to eat.  He is otherwise feeling well.  He does have chronic fatigue but contributes this to his poor sleeping habit for the last 2 years.  He continues to complete all his ADLs on his own.  He has a good appetite and denies any recent weight changes.  He denies nausea, vomiting or abdominal pain.  His bowel habits are unchanged without recurrent episodes of diarrhea or constipation.  He denies easy bruising or signs of active bleeding.  He denies fevers, chills, night sweats, shortness of breath, chest pain, cough, palpable lymph nodes, peripheral edema or neuropathy.  He has no other complaints.  Rest of the 10 point ROS is below.  ASSESSMENT & PLAN Jacob [name] is a 72 y.o. 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  He is accompanied by his wife for this visit.  We reviewed the pathology report in detail and discussed remaining diagnostic workup that is needed before we can finalize treatment recommendations.  Patient will proceed with laboratory evaluation today followed by staging PET scan scheduled for 04/15/2023.  Patient will need to be evaluated by ENT and radiation oncology.  We will present patient's case at multidisciplinary head neck tumor board after PET scan is obtained.  Patient expressed understanding of the workup and plan provided.   #Squamous cell carcinoma of the tongue: --Confirmed with biopsy on 03/26/2023. Will request p16 testing. --Need staging PET Scan --Referral to ENT and radiation oncology placed --Will present patient's case at multidisciplinary head neck tumor board after PET scan. --RTC once workup is complete   No orders of the defined types were placed in this encounter.   Current Complaints / other details:  No major concerns at his time. Trach area almost healed up. Wounds on left arm form grafting.   Vitals:   06/19/23  1308  BP: 105/77  Pulse: 97  Resp: 18  Temp: (!) 97.5 F (36.4 C)  SpO2: 100%

## 2023-06-16 ENCOUNTER — Telehealth: Payer: Self-pay | Admitting: Cardiovascular Disease

## 2023-06-16 NOTE — Telephone Encounter (Signed)
Called and spoke with pt/wife. Pt is receiving Glucerna 12oz tube feedings 4 x day via PEG tube.  Pt is concerned with amt of Vit K in it.  Told pt we may have to increase warfarin dose since this is his only source of nutrition at this point.  Made him an INR appt for tomorrow 6/26 at 2pm.  Will adjust dose then if needed based on INR result.  Pt/Wife verbalized understanding.

## 2023-06-16 NOTE — Telephone Encounter (Signed)
Pt c/o medication issue:  1. Name of Medication: Glucerna Supplement  2. How are you currently taking this medication (dosage and times per day)?   3. Are you having a reaction (difficulty breathing--STAT)? No  4. What is your medication issue? Pt stated he noticed Vitamin K is in it and he's not supposed to have Vitamin K. Pt has been taking this since the beginning of June. He's has some concerns and would like a callback from [place] to discuss further.

## 2023-06-17 ENCOUNTER — Ambulatory Visit: Payer: PPO | Attending: Cardiology | Admitting: *Deleted

## 2023-06-17 DIAGNOSIS — Z5181 Encounter for therapeutic drug level monitoring: Secondary | ICD-10-CM

## 2023-06-17 DIAGNOSIS — I4891 Unspecified atrial fibrillation: Secondary | ICD-10-CM | POA: Diagnosis not present

## 2023-06-17 LAB — POCT INR: INR: 1.2 — AB (ref 2.0–3.0)

## 2023-06-17 NOTE — Patient Instructions (Signed)
Take warfarin 2 1/2 tablets tonight and tomorrow night then increase dose to 2 tablets daily except 1 1/2 tablets on Mondays, Wednesdays and Fridays. S/P oral surgery on tongue 5/24. On 12oz of Glucerna 4 x day via PEG tube Will start radiation in appx 6 wks x 6 wks. Recheck in 1 week

## 2023-06-18 ENCOUNTER — Telehealth: Payer: Self-pay | Admitting: Internal Medicine

## 2023-06-18 ENCOUNTER — Other Ambulatory Visit: Payer: Self-pay

## 2023-06-18 DIAGNOSIS — C029 Malignant neoplasm of tongue, unspecified: Secondary | ICD-10-CM

## 2023-06-18 DIAGNOSIS — Z91843 Risk for dental caries, high: Secondary | ICD-10-CM | POA: Diagnosis not present

## 2023-06-18 DIAGNOSIS — K036 Deposits [accretions] on teeth: Secondary | ICD-10-CM | POA: Diagnosis not present

## 2023-06-18 DIAGNOSIS — Z0120 Encounter for dental examination and cleaning without abnormal findings: Secondary | ICD-10-CM | POA: Diagnosis not present

## 2023-06-18 DIAGNOSIS — K0530 Chronic periodontitis, unspecified: Secondary | ICD-10-CM | POA: Diagnosis not present

## 2023-06-18 NOTE — Telephone Encounter (Signed)
Patient is aware of upcoming appointment time/date. 

## 2023-06-18 NOTE — Progress Notes (Signed)
Has armband been applied?  Yes.    Does patient have an allergy to IV contrast dye?: No   Has patient ever received premedication for IV contrast dye?: n/a   Does patient take metformin?: Yes.    If patient does take metformin when was the last dose: June 19, 2023  Date of lab work: 06/05/2023 BUN: 10 CR: 0.67 eGfr: >90  IV site: antecubital right, condition patent and no redness  Has IV site been added to flowsheet?  Yes.    This concludes the interaction.  Ruel Favors, LPN

## 2023-06-19 ENCOUNTER — Ambulatory Visit
Admission: RE | Admit: 2023-06-19 | Discharge: 2023-06-19 | Disposition: A | Discharge status: Home / Self Care | Payer: PPO | Source: Ambulatory Visit | Attending: Radiation Oncology | Admitting: Radiation Oncology

## 2023-06-19 ENCOUNTER — Encounter: Payer: Self-pay | Admitting: Radiation Oncology

## 2023-06-19 ENCOUNTER — Other Ambulatory Visit: Payer: Self-pay

## 2023-06-19 VITALS — BP 105/77 | HR 97 | Temp 97.5°F | Resp 18 | Ht 73.0 in | Wt 155.2 lb

## 2023-06-19 DIAGNOSIS — I10 Essential (primary) hypertension: Secondary | ICD-10-CM | POA: Insufficient documentation

## 2023-06-19 DIAGNOSIS — Z7984 Long term (current) use of oral hypoglycemic drugs: Secondary | ICD-10-CM | POA: Insufficient documentation

## 2023-06-19 DIAGNOSIS — C021 Malignant neoplasm of border of tongue: Secondary | ICD-10-CM | POA: Insufficient documentation

## 2023-06-19 DIAGNOSIS — Z87891 Personal history of nicotine dependence: Secondary | ICD-10-CM | POA: Insufficient documentation

## 2023-06-19 DIAGNOSIS — R531 Weakness: Secondary | ICD-10-CM | POA: Diagnosis not present

## 2023-06-19 DIAGNOSIS — I2510 Atherosclerotic heart disease of native coronary artery without angina pectoris: Secondary | ICD-10-CM | POA: Insufficient documentation

## 2023-06-19 DIAGNOSIS — I700 Atherosclerosis of aorta: Secondary | ICD-10-CM | POA: Diagnosis not present

## 2023-06-19 DIAGNOSIS — Z7901 Long term (current) use of anticoagulants: Secondary | ICD-10-CM | POA: Insufficient documentation

## 2023-06-19 DIAGNOSIS — E785 Hyperlipidemia, unspecified: Secondary | ICD-10-CM | POA: Insufficient documentation

## 2023-06-19 DIAGNOSIS — Z85810 Personal history of malignant neoplasm of tongue: Secondary | ICD-10-CM | POA: Insufficient documentation

## 2023-06-19 DIAGNOSIS — Z79899 Other long term (current) drug therapy: Secondary | ICD-10-CM | POA: Diagnosis not present

## 2023-06-19 DIAGNOSIS — I4891 Unspecified atrial fibrillation: Secondary | ICD-10-CM | POA: Insufficient documentation

## 2023-06-19 DIAGNOSIS — C029 Malignant neoplasm of tongue, unspecified: Secondary | ICD-10-CM

## 2023-06-19 DIAGNOSIS — Z952 Presence of prosthetic heart valve: Secondary | ICD-10-CM | POA: Diagnosis not present

## 2023-06-19 DIAGNOSIS — M109 Gout, unspecified: Secondary | ICD-10-CM | POA: Diagnosis not present

## 2023-06-19 DIAGNOSIS — E119 Type 2 diabetes mellitus without complications: Secondary | ICD-10-CM | POA: Diagnosis not present

## 2023-06-19 DIAGNOSIS — R000 Tachycardia, unspecified: Secondary | ICD-10-CM | POA: Diagnosis not present

## 2023-06-19 MED ORDER — SODIUM CHLORIDE 0.9% FLUSH
10.0000 mL | INTRAVENOUS | Status: DC | PRN
  Administered 2023-06-19: 10 mL via INTRAVENOUS

## 2023-06-19 NOTE — Addendum Note (Signed)
Encounter addended by: Sherren Kerns, RN on: 06/19/2023 4:35 PM  Actions taken: LDA properties accepted

## 2023-06-19 NOTE — Progress Notes (Signed)
Radiation Oncology         [phone] ________________________________  Follow-up New Visit   Outpatient   Name: Jacob [name] MRN: [id number]  Date: 06/19/2023  DOB: March 06, 1951  NW:GNFA, Kathleene Hazel, MD  Jaci Standard, MD   REFERRING PHYSICIAN: Jaci Standard, MD  DIAGNOSIS: No diagnosis found.   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 male who presents today to further discuss the role of radiation therapy in management of his recently diagnosed tongue cancer. To review from his initial consultation, we discussed the role of up front surgery which would give him the best chance of cure. We also discussed the role of post-operative radiation therapy x 6 weeks directed at the tongue and bilateral neck lymph nodes.   Since his consultation date, the patient accordingly met with Dr. Hezzie Bump and Dr. Christoper Allegra to discuss treatment options. Prior to proceeding with surgery, a CT of the neck was performed on 04/30/23 which showed: a possible area of mass like hyper-enhancement in the left oral cavity; an enlarged left level 2A lymph node with areas of low density concerning for necrosis and metastatic disease; and an asymmetric and somewhat prominent left level 3 lymph node. (The left sided tongue cancer was not well demonstrated on this study).   Per Dr. Greggory Stallion and Dr. Anselmo Rod recommendation, the patient opted to proceed with a partial left sided glossectomy, bilateral neck dissection, tracheostomy placement, left  radial forearm free flap, split thickness skin graft, and Dobbhoff tube placement on 05/15/2023. Pathology from the procedure revealed: tumor the size of 3.5 cm; histology of moderately differentiated squamous cell carcinoma invading a depth of at least 20 mm; positive for PNI (largest involved nerve measuring 0.3 mm); negative for LVI; margins negative for carcinoma and dysplasia; nodal status of 1/34 lymph nodes positive for carcinoma.   He remained admitted and underwent laparoscopic G-tube placement with MIS on 05/29 and tolerated the procedure well. His trach was later downsized and capped on 5/30 without issue, and all drains were removed.   The patient has had several ED encounters since undergoing surgery, detailed as follows:  -- ED 05/29/23: Patient presented to the [facility] ED after accidentally dislodging his trach tube that morning. His trach was accordingly replaced by a respiratory therapist at bedside.  -- ED on 06/01/23: Patient presented with bleeding and a foul odor from his peg tube site as well as weakness. A CT scan of the abdomen and pelvis was performed and showed concern for soft tissue infection around the PEG tube site. Labs were also notable for lactic acid at 2.8. He was also noted to be tachycardic on arrival. In light of his presentation, the patient was recommended admission. -- He was subsequently transferred to [facility] and admitted on 06/02/23 (since his surgeons were located there). Hospital course included IV abx for g-tube site infection (later transitioned to oral abx), and continuous tube feeds (later changed to bolus home tube feeds). He was discharged  on 06/06/23 in stable condition.   He has also met with speech pathology and nutrition. Upon record review, the patient has yet to meet with dentistry to obtain pre-radiation clearance. ***  Nutrition Status Yes No Comments  Weight changes? [x]   []      Wt Readings from Last 3 Encounters:  06/19/23 155 lb 3.2 oz  (70.4 kg)  06/01/23 153 lb (69.4 kg)  05/29/23 157 lb (71.2 kg)    Has noticed weight loss  Swallowing concerns? [x]   []   Drinking water and coffee and bites of applesauce, plan is to slowly increase intake with swallowing studies  PEG? [x]   []   Placed on 05-20-23, glucerna four times a day (12oz containers)     Referrals Yes No Comments  Social Work? [x]   []      Dentistry? [x]   []   No areas of concern  Swallowing therapy? [x]   []   Yes working with SLP  Nutrition? [x]   []      Med/Onc? [x]   []        Safety Issues Yes No Comments  Prior radiation? []   [x]      Pacemaker/ICD? []   [x]      Possible current pregnancy? []   [x]      Is the patient on methotrexate? []   [x]        PATH REPORT 05-15-23 ORAL CAVITY - All Specimens  8th Edition - Protocol posted: 06/11/2022   SPECIMEN    Procedure:    Glossectomy: Left   TUMOR    Tumor Focality:    Unifocal     Tumor Site:    Oral cavity       Tumor Subsite:    Lateral border of tongue     Tumor Laterality:    Left     Tumor Laterality:    Midline     Tumor Size:    Greatest Dimension (Centimeters): 3.5 cm       Additional Dimension (Centimeters):    3.5 cm       Additional Dimension (Centimeters):    2 cm     Histologic Type:          :    Squamous cell carcinoma, conventional (keratinizing)     Histologic Grade:    G2, moderately differentiated     Tumor Depth of Invasion (DOI):    At least: 20 mm     Lymphatic and / or Vascular Invasion:    Not identified     Perineural Invasion:    Present       Extent / Type of Perineural Invasion:    Intraneural       Diameter of Involved Nerve (Millimeters):    0.3 mm   MARGINS    Specimen Margin Status for Invasive Tumor:    All specimen margins negative for invasive tumor       Distance from Invasive Tumor to Closest Specimen Margin:    7 mm       Closest Specimen Margin(s) to Invasive Tumor:    Posterior   REGIONAL LYMPH NODES     Regional Lymph Node Status:          :    Tumor present in  regional lymph node(s)         Number of Lymph Nodes with Tumor:    1         Laterality of Lymph Node(s) with Tumor:    Ipsilateral (including midline): Left         Nodal  Site(s) with Tumor:    Level II         Nodal Site(s) with Tumor:    Level III         Size of Largest Nodal Metastatic Deposit:    0.9 cm         Extranodal Extension (ENE):    Not identified       Number of Lymph Nodes Examined:    34   pTNM CLASSIFICATION (AJCC 8th Edition)     Reporting of pT, pN, and (when applicable) pM categories is based on information available to the pathologist at the time the report is issued. As per the AJCC (Chapter 1, 8th Ed.) it is the managing physician's responsibility to establish the final pathologic stage based upon all pertinent information, including but potentially not limited to this pathology report.     pT Category:    pT3     pN Category:    pN1    Tobacco/Marijuana/ HPI Initial Consultation - 04/17/23 ::Jacob [name] is a 72 y.o. male who presented to Dr. Ellery Plunk at [facility] on 03/19/23 for evaluation of an enlarging left tongue lesion. Per encounter notes, the patient stated that he was seen by his dentist on 02/20/23 to discuss crown placement. He pointed out the lesion to his dentist who placed a referral to oral surgery for further evaluation. The patient was also started on dexamethasone mouth rinse by his dentist which he continues to use.    Biopsy of the left lateral tongue lesion collected by Dr. Ellery Plunk on 03/26/23 showed findings consistent with invasive well differentiated keratinizing squamous cell carcinoma, with evidence of PNI exceeding 4.0 mm. Tumor was noted to be present in all margins of the specimen.    Subsequently, the patient was referred to [facility] rapid diagnostic clinic on 04/07/23 for further management. The patient was evaluated by PA Thayil during this visit who recommended proceeding with p16 testing, PET scan for staging,  and referrals to ENT and radiation oncology. She also recommended that his case be presented at the multidisciplinary head neck tumor board.   The patient was accordingly referred to Dr. Ernestene Kiel (ENT) on 04/10/23 to further discuss treatment options. Curative treatment options were discussed with the patient including a glossectomy vs partial removal of the tongue, and a left neck dissection with possible tissue transfer/reconstruction. Dr. Ernestene Kiel has already placed an urgent referral to [facility] surgery. He is currently scheduled to meet with Dr. Hezzie Bump in consultation on 04/22/23.    Pertinent imaging thus far includes a PET scan performed on 04/15/23 which demonstrates: Primary and oral tongue and definite left-sided cervical adenopathy with possible contralateral right-sided adenopathy.  There is nonspecific hypermetabolic activity in the floor of mouth.  None of his lymph nodes are very large.  He has a focus of hypermetabolic activity in the distal transverse colon but he does state that he had a colonoscopy 2 years ago.  I personally reviewed his imaging.   A soft tissue neck CT has also been ordered by Dr. Ernestene Kiel.  This is pending   Swallowing issues, if any: none  Pain status: mild pain at the site of the lesion; he denies any bleeding from the site and states that the pain does not interfere with his ability to eat   Other symptoms: some speech difficulties which have improved with dexamethasone mouth rinse use, and chronic fatigue which he attributes to poor sleeping habits for the past several years  Tobacco history, if any: 15 pack year smoking history - quit in 1985 (smoked from the ages of age of [age])   ETOH abuse, if any: none   Prior cancers, if any: none  PREVIOUS RADIATION THERAPY: No  PAST MEDICAL HISTORY:  has a past medical history of A-fib (HCC), CAD (coronary artery disease), Cancer of tongue (HCC) (05/15/2023), Dyslipidemia, Essential (primary)  hypertension, Gout, Hyperlipidemia, unspecified, Hypertension, Myxomatous degeneration of mitral valve, Other fatigue, Other specified soft tissue disorders, Rheumatic mitral valve disease, unspecified, S/P mitral valve replacement (12/11/2004), Type 2 diabetes mellitus without complications (HCC), and Unspecified atrial fibrillation (HCC).    PAST SURGICAL HISTORY: Past Surgical History:  Procedure Laterality Date   BIOPSY  09/23/2021   Procedure: BIOPSY;  Surgeon: Lanelle Bal, DO;  [facility]: [facility];  Service: Endoscopy;;   CARDIAC CATHETERIZATION  12/03/2004   sign. one vessel disease,severe MR   CARDIAC CATHETERIZATION  01/19/2006   normal   CARDIAC CATHETERIZATION  06/06/2010   mild nonobstructive CAD   COLONOSCOPY WITH PROPOFOL N/A 09/23/2021   Procedure: COLONOSCOPY WITH PROPOFOL;  Surgeon: Lanelle Bal, DO;  Location: [facility];  Service: Endoscopy;  Laterality: N/A;  9:15am   cyst  1980   removed from wrist   ESOPHAGOGASTRODUODENOSCOPY (EGD) WITH PROPOFOL N/A 09/23/2021   Procedure: ESOPHAGOGASTRODUODENOSCOPY (EGD) WITH PROPOFOL;  Surgeon: Lanelle Bal, DO;  Location: [facility];  Service: Endoscopy;  Laterality: N/A;   MITRAL VALVE SURGERY  12/11/2004   MV repair with Randa Evens ring,closure of PFO   POLYPECTOMY  09/23/2021   Procedure: POLYPECTOMY;  Surgeon: Lanelle Bal, DO;  Location: [facility];  Service: Endoscopy;;   RADIOFREQUENCY ABLATION  12/02/2004   Dr. Monna Fam   SALIVARY GLAND SURGERY  2004   growth   tongue cancer  05/15/2023   TONSILLECTOMY  1959    FAMILY HISTORY: family history includes Cancer in his maternal grandmother and mother; Glaucoma in his mother.  SOCIAL HISTORY:  reports that he quit smoking about 39 years ago. His smoking use included cigarettes. He has never used smokeless tobacco. He reports that he does not drink alcohol and does not use drugs.  ALLERGIES: Ace inhibitors, Atenolol, Cardizem [diltiazem  hcl], Latex, Melatonin, Metoprolol, and Clindamycin/lincomycin  MEDICATIONS:  Current Outpatient Medications  Medication Sig Dispense Refill   acetaminophen (TYLENOL) 500 MG tablet Take 1,000 mg by mouth 2 (two) times daily as needed for mild pain or headache.     allopurinol (ZYLOPRIM) 300 MG tablet TAKE 1 TABLET BY MOUTH ONCE A DAY. 90 tablet 0   cholecalciferol (VITAMIN D3) 25 MCG (1000 UNIT) tablet Take 1,000 Units by mouth daily.     gabapentin (NEURONTIN) 100 MG capsule Take 100 mg by mouth 3 (three) times daily.     metFORMIN (GLUCOPHAGE) 1000 MG tablet Take 1,000 mg by mouth daily with breakfast.     omeprazole (PRILOSEC) 20 MG capsule Take 20 mg by mouth daily.     pioglitazone (ACTOS) 15 MG tablet Take 15 mg by mouth daily.     rosuvastatin (CRESTOR) 20 MG tablet Take 20 mg by mouth daily.     sitaGLIPtin (JANUVIA) 100 MG tablet Take 100 mg by mouth daily.     torsemide (DEMADEX) 20 MG tablet Take 1-2 tablets (20-40 mg total) by mouth daily. (Patient taking differently: Take 20 mg by mouth daily.) 90 tablet 1   traMADol (ULTRAM) 50 MG tablet Take 1 tablet (50 mg total) by mouth  every 6 (six) hours as needed. (Patient taking differently: Take 50 mg by mouth every 6 (six) hours as needed for moderate pain or severe pain.) 30 tablet 0   warfarin (COUMADIN) 5 MG tablet TAKE (1 & 1/2) TABLET BY MOUTH ONCE DAILY OR AS DIRECTED (Patient taking differently: Take 7.5 mg by mouth daily. TAKE (1 & 1/2) TABLETS BY MOUTH ONCE DAILY OR AS DIRECTED) 50 tablet 5   No current facility-administered medications for this encounter.    REVIEW OF SYSTEMS:  Notable for that above.   PHYSICAL EXAM:  vitals were not taken for this visit.   General: Alert and oriented, in no acute distress HEENT: Head is normocephalic. Extraocular movements are intact. Oropharynx is notable for ***. Neck: Neck is notable for *** Heart: Regular in rate and rhythm with no murmurs, rubs, or gallops. Chest: Clear to  auscultation bilaterally, with no rhonchi, wheezes, or rales. Abdomen: Soft, nontender, nondistended, with no rigidity or guarding. Extremities: No cyanosis or edema. Lymphatics: see Neck Exam Skin: No concerning lesions. Musculoskeletal: symmetric strength and muscle tone throughout. Neurologic: Cranial nerves II through XII are grossly intact. No obvious focalities. Speech is fluent. Coordination is intact. Psychiatric: Judgment and insight are intact. Affect is appropriate.   ECOG = ***  0 - Asymptomatic (Fully active, able to carry on all predisease activities without restriction)  1 - Symptomatic but completely ambulatory (Restricted in physically strenuous activity but ambulatory and able to carry out work of a light or sedentary nature. For example, light housework, office work)  2 - Symptomatic, <50% in bed during the day (Ambulatory and capable of all self care but unable to carry out any work activities. Up and about more than 50% of waking hours)  3 - Symptomatic, >50% in bed, but not bedbound (Capable of only limited self-care, confined to bed or chair 50% or more of waking hours)  4 - Bedbound (Completely disabled. Cannot carry on any self-care. Totally confined to bed or chair)  5 - Death   Santiago Glad MM, Creech RH, Tormey DC, et al. [phone]). "Toxicity and response criteria of the [facility]". Am. Evlyn Clines. Oncol. 5 (6): 649-55   LABORATORY DATA:  Lab Results  Component Value Date   WBC 10.3 06/01/2023   HGB 11.8 (L) 06/01/2023   HCT 36.5 (L) 06/01/2023   MCV 88.0 06/01/2023   PLT 451 (H) 06/01/2023   CMP     Component Value Date/Time   NA 140 06/01/2023 2146   NA 141 03/19/2020 0807   K 4.2 06/01/2023 2146   CL 95 (L) 06/01/2023 2146   CO2 29 06/01/2023 2146   GLUCOSE 411 (H) 06/01/2023 2146   BUN 49 (H) 06/01/2023 2146   BUN 21 03/19/2020 0807   CREATININE 1.03 06/01/2023 2146   CREATININE 1.18 04/07/2023 1104   CALCIUM 9.4 06/01/2023  2146   PROT 7.1 06/01/2023 2146   PROT 7.1 03/19/2020 0807   ALBUMIN 3.1 (L) 06/01/2023 2146   ALBUMIN 4.7 03/19/2020 0807   AST 31 06/01/2023 2146   AST 12 (L) 04/07/2023 1104   ALT 45 (H) 06/01/2023 2146   ALT 10 04/07/2023 1104   ALKPHOS 75 06/01/2023 2146   BILITOT 0.5 06/01/2023 2146   BILITOT 0.5 04/07/2023 1104   GFRNONAA >60 06/01/2023 2146   GFRNONAA >60 04/07/2023 1104   GFRAA 72 03/19/2020 0807      Lab Results  Component Value Date   TSH 0.900 04/07/2023  RADIOGRAPHY: CT ABDOMEN PELVIS W CONTRAST  Result Date: 06/01/2023 CLINICAL DATA:  Provided history: Peritonitis or perforation suspected Bleeding around PEG. EXAM: CT ABDOMEN AND PELVIS WITH CONTRAST TECHNIQUE: Multidetector CT imaging of the abdomen and pelvis was performed using the standard protocol following bolus administration of intravenous contrast. RADIATION DOSE REDUCTION: This exam was performed according to the departmental dose-optimization program which includes automated exposure control, adjustment of the mA and/or kV according to patient size and/or use of iterative reconstruction technique. CONTRAST:  OMNIPAQUE IOHEXOL 300 MG/ML  SOLN COMPARISON:  Noncontrast CT 05/29/2023 FINDINGS: Lower chest: Linear subsegmental atelectasis in the right lower lobe. No pleural fluid. Punctate calcified granuloma left lower lobe, benign. Hepatobiliary: Tiny subcentimeter hypodensities in the right lobe of the liver. No suspicious liver lesion. Decompressed gallbladder. No biliary dilatation. Pancreas: Parenchymal atrophy again seen. No ductal dilatation or inflammation. Spleen: Normal in size without focal abnormality. Adrenals/Urinary Tract: Normal adrenal glands. No hydronephrosis or perinephric edema. Homogeneous renal enhancement with symmetric excretion on delayed phase imaging. Urinary bladder is physiologically distended without wall thickening. Stomach/Bowel: Gastrostomy tube is in place, balloon positioned in  the gastric lumen. Moderate amount of stranding, inflammation and air within the subcutaneous tissues surrounding the gastrostomy tube. The degree of inflammation is increased from prior exam. There is no discrete fluid collection. Small foci of free air in the upper abdomen persist, however the amount of free air has diminished from prior. The stomach is overall nondistended. No small or large bowel inflammation. Small to moderate colonic stool burden. The appendix is normal. Vascular/Lymphatic: Aortic atherosclerosis. No aneurysm. The portal vein is patent. No bulky abdominopelvic adenopathy. Reproductive: Prostate is unremarkable. Other: Small foci of free air in the upper abdomen persist, however diminished from prior exam. Increased inflammation in the left upper abdominal wall soft tissues related to gastrostomy tube. Small amount of gas in the subcutaneous tissues about the right inguinal region, slightly improved. Musculoskeletal: Stable osseous structures. No acute osseous findings. IMPRESSION: 1. Gastrostomy tube is in place, balloon positioned in the gastric lumen. Increased inflammatory change about the gastrostomy tube in the subcutaneous tissues as well as slight increase in soft tissue gas adjacent to the tubing. No drainable subcutaneous fluid collection. 2. Small foci of free air in the upper abdomen persist, however the amount of free air has diminished from prior exam. This is likely post procedural. 3. Small amount of gas in the subcutaneous tissues about the right inguinal region, slightly improved. Aortic Atherosclerosis (ICD10-I70.0). Electronically Signed   By: Narda Rutherford M.D.   On: 06/01/2023 22:54   CT Renal Stone Study  Result Date: 05/29/2023 CLINICAL DATA:  Abnormal x-ray. Possible free air. Head and neck cancer. Status post G-tube placement 05/20/2019 EXAM: CT ABDOMEN AND PELVIS WITHOUT CONTRAST TECHNIQUE: Multidetector CT imaging of the abdomen and pelvis was performed  following the standard protocol without IV contrast. RADIATION DOSE REDUCTION: This exam was performed according to the departmental dose-optimization program which includes automated exposure control, adjustment of the mA and/or kV according to patient size and/or use of iterative reconstruction technique. COMPARISON:  Chest x-ray earlier 05/29/2023.  Prior CT 07/10/2021 FINDINGS: Lower chest: There is some linear opacity lung bases likely scar or atelectasis. No pleural effusion. Calcified punctate left lower lobe lung nodule on series 4, image 11 consistent with old granulomatous disease. Status post median sternotomy. Hepatobiliary: On this non IV contrast exam, grossly the liver is preserved. The gallbladder is nondilated. Pancreas: Moderate atrophy of the pancreas.  No  obvious mass. Spleen: Normal in size without focal abnormality. Adrenals/Urinary Tract: Adrenal glands are unremarkable. Kidneys are normal, without renal calculi, focal lesion, or hydronephrosis. Bladder is unremarkable. Stomach/Bowel: Since the prior examinations there has been placement of the G-tube in the left upper quadrant with significant surrounding stranding, soft tissue thickening and some hematoma. Balloon of the catheter is along the antrum of the stomach. There is some scattered free air. This could be postprocedure related. Stomach itself is nondilated. Proximal portions of the stomach are somewhat featureless. There is some high density along the lumen of the colon. Please correlate with previous contrast administration or other process. There are scattered colonic stool. Normal appendix in the right lower quadrant extending posterior to the cecum. Vascular/Lymphatic: Aortic atherosclerosis. No enlarged abdominal or pelvic lymph nodes. Reproductive: Prostate is unremarkable. Other: There is some air along the anterior pelvic subcutaneous fat towards the inguinal region. Please correlate with previous intervention Musculoskeletal:  Osteopenia. Scattered degenerative changes. Multilevel Schmorl's node deformity. IMPRESSION: There are some scattered free air but there has been recent placement of the G-tube with gas, stranding and hematoma. Balloon of the catheter along the antrum of the stomach. No bowel obstruction. No loculated free fluid. Please correlate with surgical and clinical history. Electronically Signed   By: Karen Kays M.D.   On: 05/29/2023 11:59   DG Chest Port 1 View  Result Date: 05/29/2023 CLINICAL DATA:  Shortness of breath. EXAM: PORTABLE CHEST 1 VIEW COMPARISON:  June 03, 2010. FINDINGS: Status post cardiac valve repair. Tracheostomy tube is in grossly good position. No pneumothorax or pleural effusion is noted. Lungs are clear. Bony thorax is unremarkable. However, there is the suggestion of air underneath the right hemidiaphragm concerning for pneumoperitoneum. IMPRESSION: There is the suggestion of air underneath the right hemidiaphragm; while this may represent interposition of colon, pneumoperitoneum cannot be excluded. Further evaluation with decubitus views of the abdomen or CT scan is recommended. Electronically Signed   By: Lupita Raider M.D.   On: 05/29/2023 10:52      IMPRESSION/PLAN:  This is a delightful patient with head and neck cancer. I *** recommend radiotherapy for this patient.  We discussed the potential risks, benefits, and side effects of radiotherapy. We talked in detail about acute and late effects. We discussed that some of the most bothersome acute effects may be mucositis, dysgeusia, salivary changes, skin irritation, hair loss, dehydration, weight loss and fatigue. We talked about late effects which include but are not necessarily limited to dysphagia, hypothyroidism, nerve injury, vascular injury, spinal cord injury, xerostomia, trismus, neck edema, and potential injury to any of the tissues in the head and neck region. No guarantees of treatment were given. A consent form was  signed and placed in the patient's medical record. The patient is enthusiastic about proceeding with treatment. I look forward to participating in the patient's care.    Simulation (treatment planning) will take place ***  We also discussed that the treatment of head and neck cancer is a multidisciplinary process to maximize treatment outcomes and quality of life. For this reason the following referrals have been or will be made:  *** Medical oncology to discuss chemotherapy   *** Dentistry for dental evaluation, possible extractions in the radiation fields, and /or advice on reducing risk of cavities, osteoradionecrosis, or other oral issues.  *** Nutritionist for nutrition support during and after treatment.  *** Speech language pathology for swallowing and/or speech therapy.  *** Social work for social support.   ***  Physical therapy due to risk of lymphedema in neck and deconditioning.  *** Baseline labs including TSH.  On date of service, in total, I spent *** minutes on this encounter. Patient was seen in person.  __________________________________________   Lonie Peak, MD  This document serves as a record of services personally performed by Lonie Peak, MD. It was created on her behalf by Neena Rhymes, a trained medical scribe. The creation of this record is based on the scribe's personal observations and the provider's statements to them. This document has been checked and approved by the attending provider.

## 2023-06-19 NOTE — Progress Notes (Signed)
Oncology Nurse Navigator Documentation   I met with Jacob [name] and his wife before and during his follow up new appointment with Dr. Basilio Cairo today. She went over the plan for radiation and side effects to expect. I also answered questions to the best of my ability. He also had his CT simulation today and will start treatment on 7/9. They know to call me if they have any questions or concerns before or during his treatment.   Hedda Slade RN, BSN, OCN Head & Neck Oncology Nurse Navigator [facility] Phone # [phone]  Fax # [phone]

## 2023-06-21 ENCOUNTER — Encounter: Payer: Self-pay | Admitting: Radiation Oncology

## 2023-06-21 DIAGNOSIS — Z9049 Acquired absence of other specified parts of digestive tract: Secondary | ICD-10-CM | POA: Diagnosis not present

## 2023-06-21 DIAGNOSIS — Z930 Tracheostomy status: Secondary | ICD-10-CM | POA: Diagnosis not present

## 2023-06-21 DIAGNOSIS — C020 Malignant neoplasm of dorsal surface of tongue: Secondary | ICD-10-CM | POA: Diagnosis not present

## 2023-06-22 ENCOUNTER — Inpatient Hospital Stay: Payer: PPO | Attending: Physician Assistant | Admitting: Licensed Clinical Social Worker

## 2023-06-22 ENCOUNTER — Ambulatory Visit: Payer: PPO | Attending: Cardiovascular Disease | Admitting: *Deleted

## 2023-06-22 DIAGNOSIS — I4891 Unspecified atrial fibrillation: Secondary | ICD-10-CM | POA: Diagnosis not present

## 2023-06-22 DIAGNOSIS — Z5181 Encounter for therapeutic drug level monitoring: Secondary | ICD-10-CM | POA: Diagnosis not present

## 2023-06-22 LAB — POCT INR: INR: 2 (ref 2.0–3.0)

## 2023-06-22 NOTE — Patient Instructions (Signed)
Continue warfarin 2 tablets daily except 1 1/2 tablets on Mondays, Wednesdays and Fridays. S/P oral surgery on tongue 5/24. On 12oz of Glucerna 4 x day via PEG tube Will start radiation 07/01/23. Recheck in 2 weeks

## 2023-06-22 NOTE — Progress Notes (Signed)
[facility] Clinical Social Work  Initial Assessment   Jacob [name] is a 72 y.o. year old male contacted by phone. Clinical Social Work was referred by nurse navigator for new patient assessment.   SDOH (Social Determinants of Health) assessments performed: Yes   SDOH Screenings   Food Insecurity: No Food Insecurity (04/15/2023)  Housing: Low Risk  (04/15/2023)  Transportation Needs: No Transportation Needs (04/15/2023)  Utilities: Not At Risk (04/15/2023)  Depression (PHQ2-9): Low Risk  (04/15/2023)  Tobacco Use: Medium Risk (06/21/2023)     Family/Social Information:  Housing Arrangement: patient lives with wife Family members/support persons in your life? Family (especially wife and daughter), Friends, and Church. Has a friend who went through treatment for H&N cancer about 6 months ago Transportation concerns: no  Employment: Retired.  Income source: Actor concerns: No Type of concern: None Food access concerns: no Religious or spiritual practice: finds receiving prayer from others to be helpful Services Currently in place:  [employer]  Coping/ Adjustment to diagnosis: Patient understands treatment plan and what happens next? yes, preparing for radiation Patient reported stressors: Adjusting to my illness Current coping skills/ strengths: Ability for insight , Active sense of humor , Communication skills , Motivation for treatment/growth , Religious Affiliation , and Supportive family/friends     SUMMARY: Current SDOH Barriers:  No major barriers noted today  Clinical Social Work Clinical Goal(s):  No clinical social work goals at this time  Interventions: Discussed common feeling and emotions when being diagnosed with cancer, and the importance of support during treatment Informed patient of the support team roles and support services at [facility] Provided CSW contact information and encouraged patient to call with any questions or  concerns   Follow Up Plan: Patient will contact CSW with any support or resource needs Patient verbalizes understanding of plan: Yes    Jacob Mamula E Nickol Collister, LCSW Clinical Social Worker [facility]

## 2023-06-23 ENCOUNTER — Other Ambulatory Visit: Payer: Self-pay

## 2023-06-23 ENCOUNTER — Inpatient Hospital Stay: Payer: PPO | Admitting: Dietician

## 2023-06-23 DIAGNOSIS — E119 Type 2 diabetes mellitus without complications: Secondary | ICD-10-CM | POA: Diagnosis not present

## 2023-06-23 DIAGNOSIS — C020 Malignant neoplasm of dorsal surface of tongue: Secondary | ICD-10-CM | POA: Diagnosis not present

## 2023-06-23 DIAGNOSIS — Z9049 Acquired absence of other specified parts of digestive tract: Secondary | ICD-10-CM | POA: Diagnosis not present

## 2023-06-23 NOTE — Progress Notes (Signed)
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 S/p left partial glossectomy with bilateral neck dissection 5/24 [facility]). He is planning adjuvant radiation under the care of Dr. Basilio Cairo (first therapy scheduled 7/10).   Gtube placed 5/29 6/11-6/15[facility] admission due to PEG infection   Past medical history includes atrial fibrillation, HTN, HLD. DM2, MV replacement (2005).  Met with patient and wife in office. Patient reports recovering well from surgery. His infection has cleared. Patient denies abdominal tenderness/drainage at PEG site. He is tolerating tube feedings at goal. Wife reports giving 1 1/2 cartons of Glucerna 1.5 at 8AM, 12PM, 4PM, 8PM. She is flushing tube with 270 ml water QID. Regimen provides 2136 kcal, 117.6 g protein, 2160 ml total water with flushes. Patient reports tried small amount of tea/lemonade by mouth yesterday. This tasted good. States he is ready for brownies. Patient is followed by [facility] SLP. Reports upcoming appointment on 7/9. He is hoping to have diet advanced at this time. Patient denies nausea, vomiting, diarrhea, constipation.   Nutrition Focused Physical Exam: deferred   Medications: zyloprim, gabapentin, metformin, prilosec, crestor, tramadol, coumadin, D3   Labs: 6/14 labs reviewed    Anthropometrics:   Height: 6'1" Weight: 155 lb 3.2 oz (6/28) UBW: 165 lb 4.8 oz (4/16)  BMI: 20.48   Estimated Energy Needs  Kcals: 4098-1191 Protein: 85-106 Fluid: >/=2.1L   NUTRITION DIAGNOSIS: Inadequate oral intake related to [facility] as evidenced by Gtube to meet nutrition/hydration needs   INTERVENTION:  Continue Glucerna 1.5 - 6 cartons/day split over four feedings. Flush with 270 ml water QID Will monitor for diet advancement per SLP Contact information given     MONITORING, EVALUATION, GOAL: Patient will tolerate adequate calories and protein to minimize wt loss during treatment    Next  Visit: To be scheduled weekly with treatment

## 2023-06-26 DIAGNOSIS — C021 Malignant neoplasm of border of tongue: Secondary | ICD-10-CM | POA: Insufficient documentation

## 2023-06-26 DIAGNOSIS — Z510 Encounter for antineoplastic radiation therapy: Secondary | ICD-10-CM | POA: Diagnosis not present

## 2023-06-26 DIAGNOSIS — C029 Malignant neoplasm of tongue, unspecified: Secondary | ICD-10-CM | POA: Diagnosis not present

## 2023-06-29 DIAGNOSIS — T819XXS Unspecified complication of procedure, sequela: Secondary | ICD-10-CM | POA: Diagnosis not present

## 2023-06-29 DIAGNOSIS — L0390 Cellulitis, unspecified: Secondary | ICD-10-CM | POA: Diagnosis not present

## 2023-06-30 ENCOUNTER — Ambulatory Visit: Payer: PPO | Admitting: Radiation Oncology

## 2023-06-30 DIAGNOSIS — Z931 Gastrostomy status: Secondary | ICD-10-CM | POA: Diagnosis not present

## 2023-06-30 DIAGNOSIS — R1312 Dysphagia, oropharyngeal phase: Secondary | ICD-10-CM | POA: Diagnosis not present

## 2023-06-30 DIAGNOSIS — C029 Malignant neoplasm of tongue, unspecified: Secondary | ICD-10-CM | POA: Diagnosis not present

## 2023-07-01 ENCOUNTER — Other Ambulatory Visit: Payer: Self-pay

## 2023-07-01 ENCOUNTER — Ambulatory Visit
Admission: RE | Admit: 2023-07-01 | Discharge: 2023-07-01 | Disposition: A | Discharge status: Home / Self Care | Payer: PPO | Source: Ambulatory Visit | Attending: Radiation Oncology | Admitting: Radiation Oncology

## 2023-07-01 DIAGNOSIS — C021 Malignant neoplasm of border of tongue: Secondary | ICD-10-CM | POA: Diagnosis not present

## 2023-07-01 DIAGNOSIS — Z510 Encounter for antineoplastic radiation therapy: Secondary | ICD-10-CM | POA: Diagnosis not present

## 2023-07-01 DIAGNOSIS — C029 Malignant neoplasm of tongue, unspecified: Secondary | ICD-10-CM | POA: Diagnosis not present

## 2023-07-01 LAB — RAD ONC ARIA SESSION SUMMARY
Course Elapsed Days: 0
Plan Fractions Treated to Date: 1
Plan Prescribed Dose Per Fraction: 2 Gy
Plan Total Fractions Prescribed: 30
Plan Total Prescribed Dose: 60 Gy
Reference Point Dosage Given to Date: 2 Gy
Reference Point Session Dosage Given: 2 Gy
Session Number: 1

## 2023-07-01 NOTE — Progress Notes (Signed)
Oncology Nurse Navigator Documentation   To provide support, encouragement and care continuity, met with Mr. Freer and his wife for his initial RT.  I reviewed the 2-step treatment process, answered questions.  Mr. Wernick completed treatment without difficulty, denied questions/concerns. I reviewed the registration/arrival procedure for subsequent treatments. I encouraged them to call me with questions/concerns as treatments proceed.   Hedda Slade RN, BSN, OCN Head & Neck Oncology Nurse Navigator [facility] Phone # [phone]  Fax # [phone]

## 2023-07-02 ENCOUNTER — Other Ambulatory Visit: Payer: Self-pay

## 2023-07-02 ENCOUNTER — Ambulatory Visit
Admission: RE | Admit: 2023-07-02 | Discharge: 2023-07-02 | Disposition: A | Discharge status: Home / Self Care | Payer: PPO | Source: Ambulatory Visit | Attending: Radiation Oncology | Admitting: Radiation Oncology

## 2023-07-02 DIAGNOSIS — Z510 Encounter for antineoplastic radiation therapy: Secondary | ICD-10-CM | POA: Diagnosis not present

## 2023-07-02 DIAGNOSIS — C021 Malignant neoplasm of border of tongue: Secondary | ICD-10-CM | POA: Diagnosis not present

## 2023-07-02 DIAGNOSIS — C029 Malignant neoplasm of tongue, unspecified: Secondary | ICD-10-CM | POA: Diagnosis not present

## 2023-07-02 LAB — RAD ONC ARIA SESSION SUMMARY
Course Elapsed Days: 1
Plan Fractions Treated to Date: 2
Plan Prescribed Dose Per Fraction: 2 Gy
Plan Total Fractions Prescribed: 30
Plan Total Prescribed Dose: 60 Gy
Reference Point Dosage Given to Date: 4 Gy
Reference Point Session Dosage Given: 2 Gy
Session Number: 2

## 2023-07-03 ENCOUNTER — Ambulatory Visit
Admission: RE | Admit: 2023-07-03 | Discharge: 2023-07-03 | Disposition: A | Discharge status: Home / Self Care | Payer: PPO | Source: Ambulatory Visit | Attending: Radiation Oncology | Admitting: Radiation Oncology

## 2023-07-03 ENCOUNTER — Other Ambulatory Visit: Payer: Self-pay

## 2023-07-03 DIAGNOSIS — Z510 Encounter for antineoplastic radiation therapy: Secondary | ICD-10-CM | POA: Diagnosis not present

## 2023-07-03 DIAGNOSIS — C021 Malignant neoplasm of border of tongue: Secondary | ICD-10-CM | POA: Diagnosis not present

## 2023-07-03 DIAGNOSIS — C029 Malignant neoplasm of tongue, unspecified: Secondary | ICD-10-CM | POA: Diagnosis not present

## 2023-07-03 LAB — RAD ONC ARIA SESSION SUMMARY
Course Elapsed Days: 2
Plan Fractions Treated to Date: 3
Plan Prescribed Dose Per Fraction: 2 Gy
Plan Total Fractions Prescribed: 30
Plan Total Prescribed Dose: 60 Gy
Reference Point Dosage Given to Date: 6 Gy
Reference Point Session Dosage Given: 2 Gy
Session Number: 3

## 2023-07-06 ENCOUNTER — Other Ambulatory Visit: Payer: Self-pay

## 2023-07-06 ENCOUNTER — Ambulatory Visit
Admission: RE | Admit: 2023-07-06 | Discharge: 2023-07-06 | Disposition: A | Discharge status: Home / Self Care | Payer: PPO | Source: Ambulatory Visit | Attending: Radiation Oncology | Admitting: Radiation Oncology

## 2023-07-06 DIAGNOSIS — Z510 Encounter for antineoplastic radiation therapy: Secondary | ICD-10-CM | POA: Diagnosis not present

## 2023-07-06 DIAGNOSIS — C029 Malignant neoplasm of tongue, unspecified: Secondary | ICD-10-CM | POA: Diagnosis not present

## 2023-07-06 DIAGNOSIS — C021 Malignant neoplasm of border of tongue: Secondary | ICD-10-CM | POA: Diagnosis not present

## 2023-07-06 LAB — RAD ONC ARIA SESSION SUMMARY
Course Elapsed Days: 5
Plan Fractions Treated to Date: 4
Plan Prescribed Dose Per Fraction: 2 Gy
Plan Total Fractions Prescribed: 30
Plan Total Prescribed Dose: 60 Gy
Reference Point Dosage Given to Date: 8 Gy
Reference Point Session Dosage Given: 2 Gy
Session Number: 4

## 2023-07-06 MED ORDER — SONAFINE EX EMUL
1.0000 | Freq: Two times a day (BID) | CUTANEOUS | Status: DC
  Administered 2023-07-06: 1 via TOPICAL

## 2023-07-06 NOTE — Progress Notes (Signed)
Pt here for patient teaching.  Pt given Radiation and You booklet, Managing Acute Radiation Side Effects for Head and Neck Cancer handout, skin care instructions, and Sonafine.  Reviewed areas of pertinence such as fatigue, hair loss, mouth changes, skin changes, throat changes, headache, and taste changes . Pt able to give teach back of to pat skin and use unscented/gentle soap,apply Sonafine bid and avoid applying anything to skin within 4 hours of treatment. Pt demonstrated understanding of information given and will contact nursing with any questions or concerns.

## 2023-07-07 ENCOUNTER — Other Ambulatory Visit: Payer: Self-pay

## 2023-07-07 ENCOUNTER — Ambulatory Visit: Admission: RE | Admit: 2023-07-07 | Payer: PPO | Source: Ambulatory Visit

## 2023-07-07 ENCOUNTER — Ambulatory Visit: Payer: PPO | Attending: Cardiovascular Disease | Admitting: *Deleted

## 2023-07-07 DIAGNOSIS — Z5181 Encounter for therapeutic drug level monitoring: Secondary | ICD-10-CM

## 2023-07-07 DIAGNOSIS — I4891 Unspecified atrial fibrillation: Secondary | ICD-10-CM

## 2023-07-07 DIAGNOSIS — Z510 Encounter for antineoplastic radiation therapy: Secondary | ICD-10-CM | POA: Diagnosis not present

## 2023-07-07 DIAGNOSIS — C021 Malignant neoplasm of border of tongue: Secondary | ICD-10-CM | POA: Diagnosis not present

## 2023-07-07 DIAGNOSIS — C029 Malignant neoplasm of tongue, unspecified: Secondary | ICD-10-CM | POA: Diagnosis not present

## 2023-07-07 LAB — RAD ONC ARIA SESSION SUMMARY
Course Elapsed Days: 6
Plan Fractions Treated to Date: 5
Plan Prescribed Dose Per Fraction: 2 Gy
Plan Total Fractions Prescribed: 30
Plan Total Prescribed Dose: 60 Gy
Reference Point Dosage Given to Date: 10 Gy
Reference Point Session Dosage Given: 2 Gy
Session Number: 5

## 2023-07-07 LAB — POCT INR: INR: 1.5 — AB (ref 2.0–3.0)

## 2023-07-07 NOTE — Patient Instructions (Signed)
Take warfarin 3 tablets tonight then increase dose to 2 tablets daily. S/P oral surgery on tongue 5/24. On 12oz of Glucerna 4 x day via PEG tube Started radiation 07/01/23. Recheck in 2 weeks

## 2023-07-08 ENCOUNTER — Telehealth: Payer: Self-pay | Admitting: Dietician

## 2023-07-08 ENCOUNTER — Other Ambulatory Visit: Payer: Self-pay

## 2023-07-08 ENCOUNTER — Ambulatory Visit
Admission: RE | Admit: 2023-07-08 | Discharge: 2023-07-08 | Disposition: A | Discharge status: Home / Self Care | Payer: PPO | Source: Ambulatory Visit | Attending: Radiation Oncology | Admitting: Radiation Oncology

## 2023-07-08 DIAGNOSIS — Z510 Encounter for antineoplastic radiation therapy: Secondary | ICD-10-CM | POA: Diagnosis not present

## 2023-07-08 DIAGNOSIS — C029 Malignant neoplasm of tongue, unspecified: Secondary | ICD-10-CM | POA: Diagnosis not present

## 2023-07-08 DIAGNOSIS — C021 Malignant neoplasm of border of tongue: Secondary | ICD-10-CM | POA: Diagnosis not present

## 2023-07-08 LAB — RAD ONC ARIA SESSION SUMMARY
Course Elapsed Days: 7
Plan Fractions Treated to Date: 6
Plan Prescribed Dose Per Fraction: 2 Gy
Plan Total Fractions Prescribed: 30
Plan Total Prescribed Dose: 60 Gy
Reference Point Dosage Given to Date: 12 Gy
Reference Point Session Dosage Given: 2 Gy
Session Number: 6

## 2023-07-08 NOTE — Telephone Encounter (Signed)
Received VM from patient wife. Reports patient is feeling nauseous with bolus feeds. Unable to reach wife. Left message with contact information.

## 2023-07-08 NOTE — Telephone Encounter (Addendum)
Reports feeling "uncomfortable" feeling in stomach at PEG site when giving a feeding. Discomfort is lasting ~30 minutes after bolus. Patient states this has been ongoing since start of tube feedings, but thought it was "something he was going to have to deal with." Patient denies nausea.    Patient is giving 1 1/2 cartons of Glucerna 1.5 at 8AM, 12PM, 4PM, 8PM. She is flushing tube with 270 ml water QID. RD suggested reducing water flushes per feeding to reduce bolus volume as noted below.  Wife reports time of feedings has become challenging to adhere to with treatment as they live ~45 minutes from cancer center. Informed wife that adjustments can be made to schedule that works best for them. Recommended to provide ~3 hours in between feedings.   Glucerna 1.5 - give 1 1/2 cartons via PEG QID. Flush tube with 60 ml water before and after each bolus. Give additional 150 ml water flush QID in between feedings.  Instructions written out for patient. He will pick this up after therapy today.

## 2023-07-09 ENCOUNTER — Encounter: Payer: Self-pay | Admitting: Physical Therapy

## 2023-07-09 ENCOUNTER — Ambulatory Visit
Admission: RE | Admit: 2023-07-09 | Discharge: 2023-07-09 | Disposition: A | Discharge status: Home / Self Care | Payer: PPO | Source: Ambulatory Visit | Attending: Radiation Oncology | Admitting: Radiation Oncology

## 2023-07-09 ENCOUNTER — Other Ambulatory Visit: Payer: Self-pay

## 2023-07-09 ENCOUNTER — Ambulatory Visit: Payer: PPO | Attending: Radiation Oncology | Admitting: Physical Therapy

## 2023-07-09 DIAGNOSIS — C029 Malignant neoplasm of tongue, unspecified: Secondary | ICD-10-CM | POA: Diagnosis not present

## 2023-07-09 DIAGNOSIS — Z510 Encounter for antineoplastic radiation therapy: Secondary | ICD-10-CM | POA: Diagnosis not present

## 2023-07-09 DIAGNOSIS — C021 Malignant neoplasm of border of tongue: Secondary | ICD-10-CM | POA: Diagnosis not present

## 2023-07-09 DIAGNOSIS — R293 Abnormal posture: Secondary | ICD-10-CM | POA: Insufficient documentation

## 2023-07-09 LAB — RAD ONC ARIA SESSION SUMMARY
Course Elapsed Days: 8
Plan Fractions Treated to Date: 7
Plan Prescribed Dose Per Fraction: 2 Gy
Plan Total Fractions Prescribed: 30
Plan Total Prescribed Dose: 60 Gy
Reference Point Dosage Given to Date: 14 Gy
Reference Point Session Dosage Given: 2 Gy
Session Number: 7

## 2023-07-09 NOTE — Therapy (Signed)
OUTPATIENT PHYSICAL THERAPY HEAD AND NECK BASELINE EVALUATION   Patient Name: Jacob [name] MRN: [id number] DOB:Mar 19, 1951, 72 y.o., male Today's Date: 07/09/2023  END OF SESSION:  PT End of Session - 07/09/23 1412     Visit Number 1    Number of Visits 2    Date for PT Re-Evaluation 09/17/23    PT Start Time 1340    PT Stop Time 1411    PT Time Calculation (min) 31 min    Activity Tolerance Patient tolerated treatment well    Behavior During Therapy [facility] for tasks assessed/performed             Past Medical History:  Diagnosis Date   A-fib (HCC)    CAD (coronary artery disease)    Cancer of tongue (HCC) 05/15/2023   Dyslipidemia    Essential (primary) hypertension    Gout    Hyperlipidemia, unspecified    Hypertension    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 one vessel disease,severe MR   CARDIAC CATHETERIZATION  01/19/2006   normal   CARDIAC CATHETERIZATION  06/06/2010   mild nonobstructive CAD   COLONOSCOPY WITH PROPOFOL N/A 09/23/2021   Procedure: COLONOSCOPY WITH PROPOFOL;  Surgeon: Lanelle Bal, DO;  [facility];  Service: Endoscopy;  Laterality: N/A;  9:15am   cyst  1980   removed from wrist   ESOPHAGOGASTRODUODENOSCOPY (EGD) WITH PROPOFOL N/A 09/23/2021   Procedure: ESOPHAGOGASTRODUODENOSCOPY (EGD) WITH PROPOFOL;  Surgeon: Lanelle Bal, DO;  [facility];  Service: Endoscopy;  Laterality: N/A;   MITRAL VALVE  SURGERY  12/11/2004   MV repair with Randa Evens ring,closure of PFO   POLYPECTOMY  09/23/2021   Procedure: POLYPECTOMY;  Surgeon: Lanelle Bal, DO;  [facility];  Service: Endoscopy;;   RADIOFREQUENCY ABLATION  12/02/2004   Dr. 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   DOE (dyspnea on exertion) 04/26/2020   Chest pain with moderate risk for cardiac etiology 04/26/2020   CAD (coronary artery disease) 04/26/2020   Chronic anticoagulation 03/13/2020   Essential (primary) hypertension    Hyperlipidemia, unspecified    Other fatigue    Other specified soft tissue disorders    Rheumatic mitral valve disease, unspecified    Unspecified atrial fibrillation (HCC)    Atrial fibrillation (HCC) 11/03/2019   Encounter for therapeutic drug monitoring 11/03/2019   Diabetes mellitus type 2 in nonobese (HCC) 01/10/2016   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    PERTINENT HISTORY:  SCC of the tongue, stage IVA (T2N2cM0) He presented to Dr. Ellery Plunk at [facility]illofacial surgery on 03/19/23 for evaluation of an enlarging tongue lesion. Biopsy of the lateral tongue lesion collected by Dr. Ellery Plunk on 03/26/23 showing findings consistent with  invasive well differentiated keratinizing SCC with evidence of PNI exceeding 4.0 cm. Tumor was noted to be present in all margins of the specimen. 04/10/23 He saw Dr. Ernestene Kiel at [facility] to discuss options including glossectomy , left neck dissection and urgent referral was placed to [facility]. 04/15/23 PET which demonstrated: Primary and oral tongue and definite left-sided cervical adenopathy with possible contralateral right-sided adenopathy.  There is nonspecific hypermetabolic activity in the floor of mouth.  None of his lymph nodes are very large.  He has a focus of hypermetabolic activity in the distal transverse colon but he does state that he had a colonoscopy 2 years ago. 05/15/23 Surgery consisting of partial left sided glossectomy, bilateral neck dissection, tracheostomy (later decannulated), left radial forearm free flap, split thickness skin graft. Pathology revealed: tumor the size of 3.5 cm; histology of moderately differentiated squamous cell carcinoma invading a depth of at least 20 mm; positive for PNI (largest involved nerve measuring 0.3 mm); negative for LVI; margins negative for carcinoma and dysplasia; nodal status of 1/34 lymph nodes positive for carcinoma.  No ECE. He will receive 30 fractions of radiation to his Tongue and bilateral neck which started on 07/01/23 and will complete 08/11/23.  PATIENT GOALS:   to be educated about the signs and symptoms of lymphedema and learn post op HEP.   PAIN:  Are you having pain? Yes: NPRS scale: 5/10 Pain location: head Pain description: "just hurts" pressing pain Aggravating factors: wearing mask for radiation Relieving factors: goes away on it's own  PRECAUTIONS: Active CA  RED FLAGS: None   WEIGHT BEARING RESTRICTIONS: No  FALLS:  Has patient fallen in last 6 months? No Does the patient have a fear of falling that limits activity? No Is the patient reluctant to leave the house due to a fear of falling?No  LIVING  ENVIRONMENT: Patient lives with: wife Lives in: House/apartment Has following equipment at home:  occasionally uses a walk stick for long walks  OCCUPATION: retired  LEISURE:  prior to surgery pt was walking daily for about 40 min/day  PRIOR LEVEL OF FUNCTION: Independent   OBJECTIVE:  COGNITION: Overall cognitive status: Within functional limits for tasks assessed                  POSTURE:  Forward head and rounded shoulders posture  30 SEC SIT TO STAND: 10 reps in 30 sec without use of UEs which is  Below average for patient's age  SHOULDER AROM:   WFL   CERVICAL AROM:   Percent limited  Flexion 25% limited  Extension 25% limited  Right lateral flexion WFL  Left lateral flexion WFL  Right rotation WFL  Left rotation WFL    (Blank rows=not tested)  GAIT: Assessed: Yes Assistance needed: Independent Ambulation Distance: 10 feet Assistive Device: none Gait pattern: WFL Ambulation surface: Level  PATIENT EDUCATION:  Education details: Neck ROM,  importance of posture when sitting, standing and lying down, deep breathing, walking program and importance of staying active throughout treatment, CURE article on staying active, "Why exercise?" flyer, lymphedema and PT info Person educated: Patient Education method: Explanation, Demonstration, Handout Education comprehension: Patient verbalized understanding and returned demonstration  HOME EXERCISE PROGRAM: Patient was instructed today in a home exercise program today for head and neck range of motion exercises. These included active cervical flexion, active cervical extension, active cervical rotation to each direction, upper trap stretch, and shoulder retraction. Patient was encouraged to do these 2-3 times a day, holding for 5 sec each and completing for 5 reps. Pt was educated that once this becomes easier then hold the stretches for 30-60 seconds.    ASSESSMENT:  CLINICAL IMPRESSION: Pt arrives to PT with  recently diagnosed tongue cancer. He will receive 30 fractions of radiation to his Tongue and bilateral neck which started on 07/01/23 and will complete 08/11/23. Pt's cervical ROM was slightly limited in direction of flexion and extension. His neck dissection scar has healed well with minimal scar tissue and no signs of swelling.  Educated pt about signs and symptoms of lymphedema as well as anatomy and physiology of lymphatic system. Educated pt in importance of staying as active as possible throughout treatment to decrease fatigue as well as head and neck ROM exercises to decrease loss of ROM. Will see pt after completion of radiation to reassess ROM and assess for lymphedema and to determine therapy needs at that time.  Pt will benefit from skilled therapeutic intervention to improve on the following deficits: Decreased knowledge of precautions and postural dysfunction.  PT treatment/interventions: ADL/self-care home management, pt/family education, therapeutic exercise.   REHAB POTENTIAL: Good  CLINICAL DECISION MAKING: Stable/uncomplicated  EVALUATION COMPLEXITY: Low   GOALS: Goals reviewed with patient? YES  LONG TERM GOALS: (STG=LTG)   Name Target Date  Goal status  1 Patient will be able to verbalize understanding of a home exercise program for cervical range of motion, posture, and walking.   Baseline:  No knowledge 07/09/2023 Achieved at eval  2 Patient will be able to verbalize understanding of proper sitting and standing posture. Baseline:  No knowledge 07/09/2023 Achieved at eval  3 Patient will be able to verbalize understanding of lymphedema risk and availability of treatment for this condition Baseline:  No knowledge 07/09/2023 Achieved at eval  4 Pt will demonstrate a return to full cervical ROM and function post operatively compared to baselines and not demonstrate any signs or symptoms of lymphedema.  Baseline: See objective measurements taken today. 09/17/23 New     PLAN:  PT FREQUENCY/DURATION: EVAL and 1 follow up appointment.   PLAN FOR NEXT SESSION: will reassess 2 weeks after completion of radiation to determine needs.  Patient will follow up at outpatient cancer rehab 2 weeks after completion of radiation.  If the patient requires physical therapy at that time, a specific plan will be dictated and sent to the referring physician for approval. The patient was educated today on appropriate basic range of motion exercises to begin now and continue throughout radiation and educated on the signs and symptoms of lymphedema. Patient verbalized good understanding.     Physical Therapy Information for During and After Head/Neck Cancer Treatment: Lymphedema is a swelling condition that you may be at risk for in your neck and/or face if you have radiation treatment to the area and/or if you have surgery that includes removing lymph nodes.  There is treatment available for  this condition and it is not life-threatening.  Contact your physician or physical therapist with concerns. An excellent resource for those seeking information on lymphedema is the National Lymphedema Network's website.  It can be accessed at www.lymphnet.org If you notice swelling in your neck or face at any time following surgery (even if it is many years from now), please contact your doctor or physical therapist to discuss this.  Lymphedema can be treated at any time but it is easier for you if it is treated early on. If you have had surgery to your neck, please check with your surgeon about how soon to start doing neck range of motion exercises.  If you are not having surgery, I encourage you to start doing neck range of motion exercises today and continue these while undergoing treatment, UNLESS you have irritation of your skin or soft tissue that is aggravated by doing them.  These exercises are intended to help you prevent loss of range of motion and/or to gain range of motion in your  neck (which can be limited by tightening effects of radiation), and NOT to aggravate these tissues if they develop sensitivities from treatment. Neck range of motion exercises should be done to the point of feeling a GENTLE, TOLERABLE stretch only.  You are encouraged to start a walking or other exercise program tomorrow and continue this as much as you are able through and after treatment.  Please feel free to call me with any questions. Leonette Most, PT, CLT Physical Therapist and Certified Lymphedema Therapist [facility] [address], [place], [place] [address] [phone] [email or web address]  WALKING  Walking is a great form of exercise to increase your strength, endurance and overall fitness.  A walking program can help you start slowly and gradually build endurance as you go.  Everyone's ability is different, so each person's starting point will be different.  You do not have to follow them exactly.  The are just samples. You should simply find out what's right for you and stick to that program.   In the beginning, you'll start off walking 2-3 times a day for short distances.  As you get stronger, you'll be walking further at just 1-2 times per day.  A. You Can Walk For A Certain Length Of Time Each Day    Walk 5 minutes 3 times per day.  Increase 2 minutes every 2 days (3 times per day).  Work up to 25-30 minutes (1-2 times per day).   Example:   Day 1-2 5 minutes 3 times per day   Day 7-8 12 minutes 2-3 times per day   Day 13-14 25 minutes 1-2 times per day  B. You Can Walk For a Certain Distance Each Day     Distance can be substituted for time.    Example:   3 trips to mailbox (at road)   3 trips to corner of block   3 trips around the block  C. Go to local high school and use the track.    Walk for distance ____ around track  Or time ____ minutes  D. Walk ____ Jog ____ Run ___   Why exercise?  So many  benefits! Here are SOME of them: Heart health, including raising your good cholesterol level and reducing heart rate and blood pressure Lung health, including improved lung capacity It burns fats, and most of [place] can stand to be leaner, whether or not we are overweight. It increases the body's  natural painkillers and mood elevators, so makes you feel better. Not only makes you feel better, but look better too Improves sleep Takes a bite out of stress May decrease your risk of many types of cancer If you are currently undergoing cancer treatment, exercise may improve your ability to tolerate treatments including chemotherapy. For everybody, it can improve your energy level. Those with cancer-related fatigue report a 40-50% reduction in this symptom when exercising regularly. If you are a survivor of breast, colon, or prostate cancer, it may decrease your risk of a recurrence. (This may hold for other cancers too, but so far we have data just for these three types.)  How to exercise: Get your doctor's okay. Pick something you enjoy doing, like walking, Zumba, biking, swimming, or whatever. Start at low intensity and time, then gradually increase.  (See walking program handout.) Set a goal to achieve over time.  The American Cancer Society, American Heart Association, and U.S. Dept. of Health and Human Services recommend 150 minutes of moderate exercise, 75 minutes of vigorous exercise, or a combination of both per week. This should be done in episodes at least 10 minutes long, spread throughout the week.  Need help being motivated? Pick something you enjoy doing, because you'll be more inclined to stick with that activity than something that feels like a chore. Do it with a friend so that you are accountable to each other. Schedule it into your day. Place it on your calendar and keep that appointment just like you do any appointment that you make. Join an exercise group that meets at a specific  time.  That way, you have to show up on time, and that makes it harder to procrastinate about doing your workout.  It also keeps you accountable--people begin to expect you to be there. Join a gym where you feel comfortable and not intimidated, at the right cost. Sign up for something that you'll need to be in shape for on a specific date, like a 1K or a 5K to walk or run, a 20 or 30 mile bike ride, a mud run or something like that. If the date is looming, you know you'll need to train to be ready for it.  An added benefit is that many of these are fundraisers for good causes. If you've already paid for a gym membership, group exercise class or event, you might as well work out, so you haven't wasted your money!    [facility], PT 07/09/2023, 2:13 PM

## 2023-07-10 ENCOUNTER — Other Ambulatory Visit: Payer: Self-pay

## 2023-07-10 ENCOUNTER — Inpatient Hospital Stay: Payer: PPO | Admitting: Dietician

## 2023-07-10 ENCOUNTER — Ambulatory Visit
Admission: RE | Admit: 2023-07-10 | Discharge: 2023-07-10 | Disposition: A | Discharge status: Home / Self Care | Payer: PPO | Source: Ambulatory Visit | Attending: Radiation Oncology | Admitting: Radiation Oncology

## 2023-07-10 DIAGNOSIS — Z510 Encounter for antineoplastic radiation therapy: Secondary | ICD-10-CM | POA: Diagnosis not present

## 2023-07-10 DIAGNOSIS — C021 Malignant neoplasm of border of tongue: Secondary | ICD-10-CM | POA: Diagnosis not present

## 2023-07-10 DIAGNOSIS — C029 Malignant neoplasm of tongue, unspecified: Secondary | ICD-10-CM | POA: Diagnosis not present

## 2023-07-10 LAB — RAD ONC ARIA SESSION SUMMARY
Course Elapsed Days: 9
Plan Fractions Treated to Date: 8
Plan Prescribed Dose Per Fraction: 2 Gy
Plan Total Fractions Prescribed: 30
Plan Total Prescribed Dose: 60 Gy
Reference Point Dosage Given to Date: 16 Gy
Reference Point Session Dosage Given: 2 Gy
Session Number: 8

## 2023-07-10 NOTE — Progress Notes (Signed)
Nutrition Follow-up:  Patient with SCC of tongue. S/p left partial glossectomy with bilateral neck dissection 5/24 [facility]). He is receiving adjuvant radiation.   Gtube placed 5/29 6/11-6/15[facility] admission due to PEG infection   Met with patient in office following treatment. He reports forehead is sore. Says the strap is too tight during radiation. Patient reports tolerating mashed potatoes at SLP follow-up. He tried pudding. States that did not go too well. He is drinking lots of water and one arnold palmer orally without difficulty. Patient reports ongoing discomfort at PEG site with bolus feeds. Water flushes are not painful. Says it is only the formula. This has been ongoing since placement on 5/29. Patient is asking if this could be due to chocolate flavor. He is unsure if it is actually chocolate, but it looks chocolate to him.   Medications: reviewed   Labs: no new labs  Anthropometrics:    Estimated Energy Needs   Kcals: 8657-8469 Protein: 85-106 Fluid: >/=2.1L  NUTRITION DIAGNOSIS: Inadequate oral intake continues - addressing via TF   INTERVENTION:  Patient will try drinking Glucerna 1.5. If tolerated encouraged him to drink supplements to reduce bolus feedings Pt is not interested in having tube evaluated at this time Continue baking soda salt water rinses     MONITORING, EVALUATION, GOAL: weight trends, intake, TF   NEXT VISIT: Friday July 26 after radiation

## 2023-07-13 ENCOUNTER — Ambulatory Visit
Admission: RE | Admit: 2023-07-13 | Discharge: 2023-07-13 | Disposition: A | Discharge status: Home / Self Care | Payer: PPO | Source: Ambulatory Visit | Attending: Radiation Oncology | Admitting: Radiation Oncology

## 2023-07-13 ENCOUNTER — Other Ambulatory Visit: Payer: Self-pay

## 2023-07-13 ENCOUNTER — Other Ambulatory Visit: Payer: Self-pay | Admitting: Radiation Oncology

## 2023-07-13 DIAGNOSIS — C029 Malignant neoplasm of tongue, unspecified: Secondary | ICD-10-CM | POA: Diagnosis not present

## 2023-07-13 DIAGNOSIS — Z510 Encounter for antineoplastic radiation therapy: Secondary | ICD-10-CM | POA: Diagnosis not present

## 2023-07-13 DIAGNOSIS — C021 Malignant neoplasm of border of tongue: Secondary | ICD-10-CM | POA: Diagnosis not present

## 2023-07-13 LAB — RAD ONC ARIA SESSION SUMMARY
Course Elapsed Days: 12
Plan Fractions Treated to Date: 9
Plan Prescribed Dose Per Fraction: 2 Gy
Plan Total Fractions Prescribed: 30
Plan Total Prescribed Dose: 60 Gy
Reference Point Dosage Given to Date: 18 Gy
Reference Point Session Dosage Given: 2 Gy
Session Number: 9

## 2023-07-13 MED ORDER — NYSTATIN 100000 UNIT/ML MT SUSP
5.0000 mL | Freq: Four times a day (QID) | OROMUCOSAL | 0 refills | Status: AC
Start: 2023-07-13 — End: ?

## 2023-07-14 ENCOUNTER — Other Ambulatory Visit: Payer: Self-pay

## 2023-07-14 ENCOUNTER — Ambulatory Visit: Admission: RE | Admit: 2023-07-14 | Payer: PPO | Source: Ambulatory Visit

## 2023-07-14 DIAGNOSIS — C029 Malignant neoplasm of tongue, unspecified: Secondary | ICD-10-CM | POA: Diagnosis not present

## 2023-07-14 DIAGNOSIS — Z510 Encounter for antineoplastic radiation therapy: Secondary | ICD-10-CM | POA: Diagnosis not present

## 2023-07-14 DIAGNOSIS — C021 Malignant neoplasm of border of tongue: Secondary | ICD-10-CM | POA: Diagnosis not present

## 2023-07-14 LAB — RAD ONC ARIA SESSION SUMMARY
Course Elapsed Days: 13
Plan Fractions Treated to Date: 10
Plan Prescribed Dose Per Fraction: 2 Gy
Plan Total Fractions Prescribed: 30
Plan Total Prescribed Dose: 60 Gy
Reference Point Dosage Given to Date: 20 Gy
Reference Point Session Dosage Given: 2 Gy
Session Number: 10

## 2023-07-15 ENCOUNTER — Ambulatory Visit
Admission: RE | Admit: 2023-07-15 | Discharge: 2023-07-15 | Disposition: A | Discharge status: Home / Self Care | Payer: PPO | Source: Ambulatory Visit | Attending: Radiation Oncology | Admitting: Radiation Oncology

## 2023-07-15 ENCOUNTER — Other Ambulatory Visit: Payer: Self-pay

## 2023-07-15 DIAGNOSIS — C029 Malignant neoplasm of tongue, unspecified: Secondary | ICD-10-CM | POA: Diagnosis not present

## 2023-07-15 DIAGNOSIS — Z510 Encounter for antineoplastic radiation therapy: Secondary | ICD-10-CM | POA: Diagnosis not present

## 2023-07-15 DIAGNOSIS — Z9049 Acquired absence of other specified parts of digestive tract: Secondary | ICD-10-CM | POA: Diagnosis not present

## 2023-07-15 DIAGNOSIS — C021 Malignant neoplasm of border of tongue: Secondary | ICD-10-CM | POA: Diagnosis not present

## 2023-07-15 DIAGNOSIS — C020 Malignant neoplasm of dorsal surface of tongue: Secondary | ICD-10-CM | POA: Diagnosis not present

## 2023-07-15 DIAGNOSIS — E119 Type 2 diabetes mellitus without complications: Secondary | ICD-10-CM | POA: Diagnosis not present

## 2023-07-15 LAB — RAD ONC ARIA SESSION SUMMARY
Course Elapsed Days: 14
Plan Fractions Treated to Date: 11
Plan Prescribed Dose Per Fraction: 2 Gy
Plan Total Fractions Prescribed: 30
Plan Total Prescribed Dose: 60 Gy
Reference Point Dosage Given to Date: 22 Gy
Reference Point Session Dosage Given: 2 Gy
Session Number: 11

## 2023-07-16 ENCOUNTER — Other Ambulatory Visit: Payer: Self-pay

## 2023-07-16 ENCOUNTER — Ambulatory Visit
Admission: RE | Admit: 2023-07-16 | Discharge: 2023-07-16 | Disposition: A | Discharge status: Home / Self Care | Payer: PPO | Source: Ambulatory Visit | Attending: Radiation Oncology | Admitting: Radiation Oncology

## 2023-07-16 DIAGNOSIS — Z510 Encounter for antineoplastic radiation therapy: Secondary | ICD-10-CM | POA: Diagnosis not present

## 2023-07-16 DIAGNOSIS — C029 Malignant neoplasm of tongue, unspecified: Secondary | ICD-10-CM | POA: Diagnosis not present

## 2023-07-16 DIAGNOSIS — C021 Malignant neoplasm of border of tongue: Secondary | ICD-10-CM | POA: Diagnosis not present

## 2023-07-16 LAB — RAD ONC ARIA SESSION SUMMARY
Course Elapsed Days: 15
Plan Fractions Treated to Date: 12
Plan Prescribed Dose Per Fraction: 2 Gy
Plan Total Fractions Prescribed: 30
Plan Total Prescribed Dose: 60 Gy
Reference Point Dosage Given to Date: 24 Gy
Reference Point Session Dosage Given: 2 Gy
Session Number: 12

## 2023-07-17 ENCOUNTER — Other Ambulatory Visit: Payer: Self-pay

## 2023-07-17 ENCOUNTER — Ambulatory Visit: Admission: RE | Admit: 2023-07-17 | Payer: PPO | Source: Ambulatory Visit

## 2023-07-17 ENCOUNTER — Ambulatory Visit: Payer: PPO | Admitting: Dietician

## 2023-07-17 DIAGNOSIS — Z510 Encounter for antineoplastic radiation therapy: Secondary | ICD-10-CM | POA: Diagnosis not present

## 2023-07-17 DIAGNOSIS — C021 Malignant neoplasm of border of tongue: Secondary | ICD-10-CM | POA: Diagnosis not present

## 2023-07-17 DIAGNOSIS — C029 Malignant neoplasm of tongue, unspecified: Secondary | ICD-10-CM | POA: Diagnosis not present

## 2023-07-17 LAB — RAD ONC ARIA SESSION SUMMARY
Course Elapsed Days: 16
Plan Fractions Treated to Date: 13
Plan Prescribed Dose Per Fraction: 2 Gy
Plan Total Fractions Prescribed: 30
Plan Total Prescribed Dose: 60 Gy
Reference Point Dosage Given to Date: 26 Gy
Reference Point Session Dosage Given: 2 Gy
Session Number: 13

## 2023-07-17 NOTE — Progress Notes (Signed)
Nutrition Follow-up:  Patient with SCC of tongue. S/p left partial glossectomy with bilateral neck dissection 5/24 [facility]). He is receiving adjuvant radiation (start 7/10)  Low-profile Gtube placed 5/29 6/11-6/15[facility] admission due to PEG infection  DME - [employer]  Met with patient and daughter in office following radiation. Patient reports tongue feels "thicker" than usual. He is taking nystatin for thrush. Patient has thick saliva. Sprite rinses are working well. He is completing baking soda salt water rinses several times daily. Patient continues drinking thin liquids by mouth. He has not tried mashed potatoes or other soft foods in the last week. Patient followed by [facility].   Patient reports ongoing pain with bolus feeds. Currently giving 4 cartons (6 cartons is goal). His wife is user plunger to push glucerna. Patient is agreeable to RD providing gravity water flush in office today. Noted skin below tube is irritated. Patient reports this is from taping tube. Tube appears worn/kinked. He has requested additional tubes from [facility] he never received them.   Medications: nystatin (7/22)  Labs: no recent for review  Anthropometrics: Wt 153.4 lb on 7/22   7/15 - 154.4 lb  6/28 - 155 lb 3.2 oz    Estimated Energy Needs   Kcals: 8413-2440 Protein: 85-106 Fluid: >/=2.1L   NUTRITION DIAGNOSIS: Inadequate oral intake continues - addressing via TF   INTERVENTION:  Suspect reported bolus feeding discomfort r/t pushing formula with syringe. Suggested trying without syringe. This was demonstrated with water flush in office today - pt and daughter understanding Instructed pt to contact [employer] to request additional tubing extensions for low-profile feeding tube Provided pt with mesh briefs to reduce skin irritation from tape Reinforced importance of meeting nutrition goals to minimize further wt loss. Patient instructed at minimum giving 5 cartons Glucerna 1.5. Encouraged him to  meet goal of 6 cartons/day as often as possible Recommend discarding toothbrush with completion of nystatin Continue baking soda salt water gargle as well as sprite rinse Interventions written down for patient   MONITORING, EVALUATION, GOAL: wt trends, intake   NEXT VISIT: Thursday August 1 after radiation with Britta Mccreedy (pt aware)

## 2023-07-20 ENCOUNTER — Other Ambulatory Visit: Payer: Self-pay | Admitting: Radiation Oncology

## 2023-07-20 ENCOUNTER — Other Ambulatory Visit: Payer: Self-pay

## 2023-07-20 ENCOUNTER — Ambulatory Visit
Admission: RE | Admit: 2023-07-20 | Discharge: 2023-07-20 | Disposition: A | Discharge status: Home / Self Care | Payer: PPO | Source: Ambulatory Visit | Attending: Radiation Oncology | Admitting: Radiation Oncology

## 2023-07-20 DIAGNOSIS — C021 Malignant neoplasm of border of tongue: Secondary | ICD-10-CM | POA: Diagnosis not present

## 2023-07-20 DIAGNOSIS — Z510 Encounter for antineoplastic radiation therapy: Secondary | ICD-10-CM | POA: Diagnosis not present

## 2023-07-20 DIAGNOSIS — C029 Malignant neoplasm of tongue, unspecified: Secondary | ICD-10-CM | POA: Diagnosis not present

## 2023-07-20 LAB — RAD ONC ARIA SESSION SUMMARY
Course Elapsed Days: 19
Plan Fractions Treated to Date: 14
Plan Prescribed Dose Per Fraction: 2 Gy
Plan Total Fractions Prescribed: 30
Plan Total Prescribed Dose: 60 Gy
Reference Point Dosage Given to Date: 28 Gy
Reference Point Session Dosage Given: 2 Gy
Session Number: 14

## 2023-07-20 MED ORDER — LIDOCAINE VISCOUS HCL 2 % MT SOLN
OROMUCOSAL | 3 refills | Status: AC
Start: 2023-07-20 — End: ?

## 2023-07-21 ENCOUNTER — Ambulatory Visit: Admission: RE | Admit: 2023-07-21 | Payer: PPO | Source: Ambulatory Visit

## 2023-07-21 ENCOUNTER — Other Ambulatory Visit: Payer: Self-pay

## 2023-07-21 DIAGNOSIS — Z510 Encounter for antineoplastic radiation therapy: Secondary | ICD-10-CM | POA: Diagnosis not present

## 2023-07-21 DIAGNOSIS — C029 Malignant neoplasm of tongue, unspecified: Secondary | ICD-10-CM | POA: Diagnosis not present

## 2023-07-21 DIAGNOSIS — C021 Malignant neoplasm of border of tongue: Secondary | ICD-10-CM | POA: Diagnosis not present

## 2023-07-21 LAB — RAD ONC ARIA SESSION SUMMARY
Course Elapsed Days: 20
Plan Fractions Treated to Date: 15
Plan Prescribed Dose Per Fraction: 2 Gy
Plan Total Fractions Prescribed: 30
Plan Total Prescribed Dose: 60 Gy
Reference Point Dosage Given to Date: 30 Gy
Reference Point Session Dosage Given: 2 Gy
Session Number: 15

## 2023-07-22 ENCOUNTER — Other Ambulatory Visit: Payer: Self-pay

## 2023-07-22 ENCOUNTER — Ambulatory Visit: Payer: PPO | Attending: Cardiovascular Disease | Admitting: *Deleted

## 2023-07-22 ENCOUNTER — Ambulatory Visit
Admission: RE | Admit: 2023-07-22 | Discharge: 2023-07-22 | Disposition: A | Discharge status: Home / Self Care | Payer: PPO | Source: Ambulatory Visit | Attending: Radiation Oncology | Admitting: Radiation Oncology

## 2023-07-22 DIAGNOSIS — C020 Malignant neoplasm of dorsal surface of tongue: Secondary | ICD-10-CM | POA: Diagnosis not present

## 2023-07-22 DIAGNOSIS — Z9049 Acquired absence of other specified parts of digestive tract: Secondary | ICD-10-CM | POA: Diagnosis not present

## 2023-07-22 DIAGNOSIS — C021 Malignant neoplasm of border of tongue: Secondary | ICD-10-CM | POA: Diagnosis not present

## 2023-07-22 DIAGNOSIS — Z5181 Encounter for therapeutic drug level monitoring: Secondary | ICD-10-CM | POA: Diagnosis not present

## 2023-07-22 DIAGNOSIS — Z510 Encounter for antineoplastic radiation therapy: Secondary | ICD-10-CM | POA: Diagnosis not present

## 2023-07-22 DIAGNOSIS — C029 Malignant neoplasm of tongue, unspecified: Secondary | ICD-10-CM | POA: Diagnosis not present

## 2023-07-22 DIAGNOSIS — I4891 Unspecified atrial fibrillation: Secondary | ICD-10-CM

## 2023-07-22 DIAGNOSIS — Z930 Tracheostomy status: Secondary | ICD-10-CM | POA: Diagnosis not present

## 2023-07-22 LAB — RAD ONC ARIA SESSION SUMMARY
Course Elapsed Days: 21
Plan Fractions Treated to Date: 16
Plan Prescribed Dose Per Fraction: 2 Gy
Plan Total Fractions Prescribed: 30
Plan Total Prescribed Dose: 60 Gy
Reference Point Dosage Given to Date: 32 Gy
Reference Point Session Dosage Given: 2 Gy
Session Number: 16

## 2023-07-22 LAB — POCT INR: INR: 2.6 (ref 2.0–3.0)

## 2023-07-22 NOTE — Patient Instructions (Signed)
Continue warfarin 2 tablets daily. S/P oral surgery on tongue 5/24. On 12oz of Glucerna 5 x day via PEG tube Started radiation 07/01/23. Recheck in 3 weeks

## 2023-07-23 ENCOUNTER — Ambulatory Visit
Admission: RE | Admit: 2023-07-23 | Discharge: 2023-07-23 | Disposition: A | Discharge status: Home / Self Care | Payer: PPO | Source: Ambulatory Visit | Attending: Radiation Oncology | Admitting: Radiation Oncology

## 2023-07-23 ENCOUNTER — Other Ambulatory Visit: Payer: Self-pay

## 2023-07-23 ENCOUNTER — Inpatient Hospital Stay: Payer: PPO | Attending: Physician Assistant | Admitting: Nutrition

## 2023-07-23 DIAGNOSIS — Z510 Encounter for antineoplastic radiation therapy: Secondary | ICD-10-CM | POA: Insufficient documentation

## 2023-07-23 DIAGNOSIS — C029 Malignant neoplasm of tongue, unspecified: Secondary | ICD-10-CM | POA: Diagnosis not present

## 2023-07-23 DIAGNOSIS — C021 Malignant neoplasm of border of tongue: Secondary | ICD-10-CM | POA: Insufficient documentation

## 2023-07-23 LAB — RAD ONC ARIA SESSION SUMMARY
Course Elapsed Days: 22
Plan Fractions Treated to Date: 17
Plan Prescribed Dose Per Fraction: 2 Gy
Plan Total Fractions Prescribed: 30
Plan Total Prescribed Dose: 60 Gy
Reference Point Dosage Given to Date: 34 Gy
Reference Point Session Dosage Given: 2 Gy
Session Number: 17

## 2023-07-23 NOTE — Progress Notes (Signed)
Contacted patient by telephone to let him know that I arranged for [employer] to send an extension set for his low profile feeding tube. Patient appreciative.

## 2023-07-23 NOTE — Progress Notes (Signed)
Nutrition follow-up completed with patient and wife after radiation therapy for SCC of tongue.  He is status post left partial glossectomy with bilateral neck dissection May, 2024 from [facility].  He is receiving adjuvant radiation therapy.  He is followed by Dr. Basilio Cairo.  Low-profile G-tube placed on May 29 at [facility]. June 11 through June 15 patient was admitted to [facility] due to feeding tube infection. DME company -[employer].  Weight documented as 154.2 pounds today, in RD office.  Weight is stable. Last weight on July 22 was 153.4 pounds.  Estimated nutrition needs: 2100-2465 cal, 85-106 g protein, greater than 2.1 L fluid.  Tube feeding goal: 6 cartons Glucerna 1.5 daily to provide 2136 cal,117.6 g protein  Total fluid provides 1080 mL from tube feeding.  Patient requires an additional 1056 mL free water throughout the day.  Nutrition follow-up completed with patient and wife after radiation treatment.  Patient reports saliva is very thick.  He uses baking soda and salt water gargle and Sprite which does thin down secretions.  Patient has been eating very soft foods such as mashed potatoes however he has stopped oral diet because he cannot push food to the back of his mouth.  Currently tolerating 5 cartons of Glucerna 1.5 daily in 3 feedings.  Wife states 4 feedings a day was too much.  He is infusing tube feeding by gravity and reports it takes about 1 hour.  Wife gives additional 240 mL of free water with each feeding.  Patient often thins Glucerna 1.5 with some of that water to speed up bolus feeding.  Patient denies nausea, vomiting, constipation, and diarrhea.  Patient requested an extension set for his low-profile feeding tube from [employer].  He was told that they could not provide this.  Patient has adequate supplies of formula.  Nutrition diagnosis: Inadequate oral intake continues.  Intervention: Continue increasing Glucerna 1.5 to goal rate of 6 cartons  daily.  Tried 2 cartons 3 times daily. Resume 270 mL free water 4 times daily to meet free water needs. Oral intake as tolerated. Will inquire about an extra extension set from home health. Continue baking soda and salt water gargles/gargles with Sprite as needed.  Monitoring, evaluation, goals: Patient will tolerate tube feeding at goal rate to minimize weight loss and promote healing.  Next visit: Friday, August 9 after radiation therapy with Rosalita Chessman.  **Disclaimer: This note was dictated with voice recognition software. Similar sounding words can inadvertently be transcribed and this note may contain transcription errors which may not have been corrected upon publication of note.**

## 2023-07-24 ENCOUNTER — Ambulatory Visit
Admission: RE | Admit: 2023-07-24 | Discharge: 2023-07-24 | Disposition: A | Discharge status: Home / Self Care | Payer: PPO | Source: Ambulatory Visit | Attending: Radiation Oncology | Admitting: Radiation Oncology

## 2023-07-24 ENCOUNTER — Other Ambulatory Visit: Payer: Self-pay

## 2023-07-24 ENCOUNTER — Encounter: Payer: PPO | Admitting: Nutrition

## 2023-07-24 DIAGNOSIS — C021 Malignant neoplasm of border of tongue: Secondary | ICD-10-CM | POA: Diagnosis not present

## 2023-07-24 DIAGNOSIS — Z510 Encounter for antineoplastic radiation therapy: Secondary | ICD-10-CM | POA: Diagnosis not present

## 2023-07-24 DIAGNOSIS — C029 Malignant neoplasm of tongue, unspecified: Secondary | ICD-10-CM | POA: Diagnosis not present

## 2023-07-24 LAB — RAD ONC ARIA SESSION SUMMARY
Course Elapsed Days: 23
Plan Fractions Treated to Date: 18
Plan Prescribed Dose Per Fraction: 2 Gy
Plan Total Fractions Prescribed: 30
Plan Total Prescribed Dose: 60 Gy
Reference Point Dosage Given to Date: 36 Gy
Reference Point Session Dosage Given: 2 Gy
Session Number: 18

## 2023-07-27 ENCOUNTER — Other Ambulatory Visit: Payer: Self-pay

## 2023-07-27 ENCOUNTER — Ambulatory Visit
Admission: RE | Admit: 2023-07-27 | Discharge: 2023-07-27 | Disposition: A | Discharge status: Home / Self Care | Payer: PPO | Source: Ambulatory Visit | Attending: Radiation Oncology | Admitting: Radiation Oncology

## 2023-07-27 DIAGNOSIS — Z510 Encounter for antineoplastic radiation therapy: Secondary | ICD-10-CM | POA: Diagnosis not present

## 2023-07-27 DIAGNOSIS — C021 Malignant neoplasm of border of tongue: Secondary | ICD-10-CM | POA: Diagnosis not present

## 2023-07-27 DIAGNOSIS — C029 Malignant neoplasm of tongue, unspecified: Secondary | ICD-10-CM | POA: Diagnosis not present

## 2023-07-27 LAB — RAD ONC ARIA SESSION SUMMARY
Course Elapsed Days: 26
Plan Fractions Treated to Date: 19
Plan Prescribed Dose Per Fraction: 2 Gy
Plan Total Fractions Prescribed: 30
Plan Total Prescribed Dose: 60 Gy
Reference Point Dosage Given to Date: 38 Gy
Reference Point Session Dosage Given: 2 Gy
Session Number: 19

## 2023-07-28 ENCOUNTER — Ambulatory Visit: Admission: RE | Admit: 2023-07-28 | Payer: PPO | Source: Ambulatory Visit

## 2023-07-28 DIAGNOSIS — Z510 Encounter for antineoplastic radiation therapy: Secondary | ICD-10-CM | POA: Diagnosis not present

## 2023-07-28 DIAGNOSIS — C021 Malignant neoplasm of border of tongue: Secondary | ICD-10-CM | POA: Diagnosis not present

## 2023-07-28 DIAGNOSIS — Z9049 Acquired absence of other specified parts of digestive tract: Secondary | ICD-10-CM | POA: Diagnosis not present

## 2023-07-28 DIAGNOSIS — C029 Malignant neoplasm of tongue, unspecified: Secondary | ICD-10-CM | POA: Diagnosis not present

## 2023-07-28 DIAGNOSIS — C020 Malignant neoplasm of dorsal surface of tongue: Secondary | ICD-10-CM | POA: Diagnosis not present

## 2023-07-28 DIAGNOSIS — E119 Type 2 diabetes mellitus without complications: Secondary | ICD-10-CM | POA: Diagnosis not present

## 2023-07-29 ENCOUNTER — Ambulatory Visit
Admission: RE | Admit: 2023-07-29 | Discharge: 2023-07-29 | Disposition: A | Discharge status: Home / Self Care | Payer: PPO | Source: Ambulatory Visit | Attending: Radiation Oncology | Admitting: Radiation Oncology

## 2023-07-29 ENCOUNTER — Other Ambulatory Visit: Payer: Self-pay

## 2023-07-29 DIAGNOSIS — C029 Malignant neoplasm of tongue, unspecified: Secondary | ICD-10-CM | POA: Diagnosis not present

## 2023-07-29 DIAGNOSIS — Z510 Encounter for antineoplastic radiation therapy: Secondary | ICD-10-CM | POA: Diagnosis not present

## 2023-07-29 DIAGNOSIS — C021 Malignant neoplasm of border of tongue: Secondary | ICD-10-CM | POA: Diagnosis not present

## 2023-07-29 LAB — RAD ONC ARIA SESSION SUMMARY
Course Elapsed Days: 28
Plan Fractions Treated to Date: 21
Plan Prescribed Dose Per Fraction: 2 Gy
Plan Total Fractions Prescribed: 30
Plan Total Prescribed Dose: 60 Gy
Reference Point Dosage Given to Date: 42 Gy
Reference Point Session Dosage Given: 2.8587 Gy
Session Number: 20

## 2023-07-30 ENCOUNTER — Other Ambulatory Visit: Payer: Self-pay

## 2023-07-30 ENCOUNTER — Ambulatory Visit
Admission: RE | Admit: 2023-07-30 | Discharge: 2023-07-30 | Disposition: A | Discharge status: Home / Self Care | Payer: PPO | Source: Ambulatory Visit | Attending: Radiation Oncology | Admitting: Radiation Oncology

## 2023-07-30 DIAGNOSIS — C021 Malignant neoplasm of border of tongue: Secondary | ICD-10-CM | POA: Diagnosis not present

## 2023-07-30 DIAGNOSIS — C020 Malignant neoplasm of dorsal surface of tongue: Secondary | ICD-10-CM | POA: Diagnosis not present

## 2023-07-30 DIAGNOSIS — Z9049 Acquired absence of other specified parts of digestive tract: Secondary | ICD-10-CM | POA: Diagnosis not present

## 2023-07-30 DIAGNOSIS — C029 Malignant neoplasm of tongue, unspecified: Secondary | ICD-10-CM | POA: Diagnosis not present

## 2023-07-30 DIAGNOSIS — E119 Type 2 diabetes mellitus without complications: Secondary | ICD-10-CM | POA: Diagnosis not present

## 2023-07-30 DIAGNOSIS — Z510 Encounter for antineoplastic radiation therapy: Secondary | ICD-10-CM | POA: Diagnosis not present

## 2023-07-30 LAB — RAD ONC ARIA SESSION SUMMARY
Course Elapsed Days: 29
Plan Fractions Treated to Date: 22
Plan Prescribed Dose Per Fraction: 2 Gy
Plan Total Fractions Prescribed: 30
Plan Total Prescribed Dose: 60 Gy
Reference Point Dosage Given to Date: 44 Gy
Reference Point Session Dosage Given: 2 Gy
Session Number: 21

## 2023-07-31 ENCOUNTER — Other Ambulatory Visit: Payer: Self-pay

## 2023-07-31 ENCOUNTER — Inpatient Hospital Stay: Payer: PPO | Admitting: Dietician

## 2023-07-31 ENCOUNTER — Ambulatory Visit: Admission: RE | Admit: 2023-07-31 | Payer: PPO | Source: Ambulatory Visit

## 2023-07-31 DIAGNOSIS — C021 Malignant neoplasm of border of tongue: Secondary | ICD-10-CM | POA: Diagnosis not present

## 2023-07-31 DIAGNOSIS — Z510 Encounter for antineoplastic radiation therapy: Secondary | ICD-10-CM | POA: Diagnosis not present

## 2023-07-31 DIAGNOSIS — C029 Malignant neoplasm of tongue, unspecified: Secondary | ICD-10-CM | POA: Diagnosis not present

## 2023-07-31 LAB — RAD ONC ARIA SESSION SUMMARY
Course Elapsed Days: 30
Plan Fractions Treated to Date: 23
Plan Prescribed Dose Per Fraction: 2 Gy
Plan Total Fractions Prescribed: 30
Plan Total Prescribed Dose: 60 Gy
Reference Point Dosage Given to Date: 46 Gy
Reference Point Session Dosage Given: 2 Gy
Session Number: 22

## 2023-07-31 NOTE — Progress Notes (Signed)
Nutrition Follow-up:  Patient with SCC of tongue. S/p left partial glossectomy with bilateral neck dissection 5/24 [facility]). He is receiving adjuvant radiation (start 7/10)   Low-profile Gtube placed 5/29 6/11-6/15[facility] admission due to PEG infection  DME - [employer]  Met with pt and wife in office following radiation. Pt reports tolerating radiation well. He does have dry mouth. He is doing baking soda salt water rinses several times a day. Wife states she wishes pt would eat orally. Pt reports he has no taste which decreases desire for oral intake. He is drinking 1-2 glasses of tea/lemonade mix. Pt has increased tube feedings to goal of 6 cartons. Pt giving 2 cartons 3x/day. He reports less abdominal pain with gravity bolus. Pt denies nausea, vomiting, diarrhea, constipation.   Pt reports he has not received feeding tube ext set for low profile tube that was ordered by RD Vernell Leep) at last nutrition appointment. Wife reports she called to check on this and was informed a MD order was needed for this.   Medications: reviewed   Labs: no new labs  Anthropometrics: Wt 153.2 lb on 8/2  7/22 - 153.4 lb 7/15 - 154.4 lb  6/28 - 155 lb 3.2 oz   Estimated Energy Needs   Kcals: 0102-7253 Protein: 85-106 Fluid: >/=2.1L   NUTRITION DIAGNOSIS: Inadequate oral intake continues - addressing via TF   INTERVENTION:  Continue giving 6 cartons Glucerna 1.5 via tube  Encouraged soft smooth textures as tolerated RD contacted [employer] - 12" Enfit mickey tube ext set in route for delivery. Est date of arrival 8/12. Per rep, product not available in local warehouse - shipped from [facility] pt to provide update - left VM with above information    MONITORING, EVALUATION, GOAL: wt trends, TF   NEXT VISIT: Friday August 16 after radiation

## 2023-08-03 ENCOUNTER — Ambulatory Visit
Admission: RE | Admit: 2023-08-03 | Discharge: 2023-08-03 | Disposition: A | Discharge status: Home / Self Care | Payer: PPO | Source: Ambulatory Visit | Attending: Radiation Oncology | Admitting: Radiation Oncology

## 2023-08-03 ENCOUNTER — Other Ambulatory Visit: Payer: Self-pay

## 2023-08-03 DIAGNOSIS — C021 Malignant neoplasm of border of tongue: Secondary | ICD-10-CM | POA: Diagnosis not present

## 2023-08-03 DIAGNOSIS — Z510 Encounter for antineoplastic radiation therapy: Secondary | ICD-10-CM | POA: Diagnosis not present

## 2023-08-03 DIAGNOSIS — C029 Malignant neoplasm of tongue, unspecified: Secondary | ICD-10-CM | POA: Diagnosis not present

## 2023-08-03 LAB — RAD ONC ARIA SESSION SUMMARY
Course Elapsed Days: 33
Plan Fractions Treated to Date: 24
Plan Prescribed Dose Per Fraction: 2 Gy
Plan Total Fractions Prescribed: 30
Plan Total Prescribed Dose: 60 Gy
Reference Point Dosage Given to Date: 48 Gy
Reference Point Session Dosage Given: 2 Gy
Session Number: 23

## 2023-08-04 ENCOUNTER — Other Ambulatory Visit: Payer: Self-pay

## 2023-08-04 ENCOUNTER — Ambulatory Visit
Admission: RE | Admit: 2023-08-04 | Discharge: 2023-08-04 | Disposition: A | Discharge status: Home / Self Care | Payer: PPO | Source: Ambulatory Visit | Attending: Radiation Oncology | Admitting: Radiation Oncology

## 2023-08-04 DIAGNOSIS — C029 Malignant neoplasm of tongue, unspecified: Secondary | ICD-10-CM | POA: Diagnosis not present

## 2023-08-04 DIAGNOSIS — Z510 Encounter for antineoplastic radiation therapy: Secondary | ICD-10-CM | POA: Diagnosis not present

## 2023-08-04 DIAGNOSIS — C021 Malignant neoplasm of border of tongue: Secondary | ICD-10-CM | POA: Diagnosis not present

## 2023-08-04 LAB — RAD ONC ARIA SESSION SUMMARY
Course Elapsed Days: 34
Plan Fractions Treated to Date: 25
Plan Prescribed Dose Per Fraction: 2 Gy
Plan Total Fractions Prescribed: 30
Plan Total Prescribed Dose: 60 Gy
Reference Point Dosage Given to Date: 50 Gy
Reference Point Session Dosage Given: 2 Gy
Session Number: 24

## 2023-08-05 ENCOUNTER — Other Ambulatory Visit: Payer: Self-pay

## 2023-08-05 ENCOUNTER — Ambulatory Visit
Admission: RE | Admit: 2023-08-05 | Discharge: 2023-08-05 | Disposition: A | Discharge status: Home / Self Care | Payer: PPO | Source: Ambulatory Visit | Attending: Radiation Oncology | Admitting: Radiation Oncology

## 2023-08-05 DIAGNOSIS — C021 Malignant neoplasm of border of tongue: Secondary | ICD-10-CM | POA: Diagnosis not present

## 2023-08-05 DIAGNOSIS — Z510 Encounter for antineoplastic radiation therapy: Secondary | ICD-10-CM | POA: Diagnosis not present

## 2023-08-05 DIAGNOSIS — C029 Malignant neoplasm of tongue, unspecified: Secondary | ICD-10-CM | POA: Diagnosis not present

## 2023-08-05 LAB — RAD ONC ARIA SESSION SUMMARY
Course Elapsed Days: 35
Plan Fractions Treated to Date: 26
Plan Prescribed Dose Per Fraction: 2 Gy
Plan Total Fractions Prescribed: 30
Plan Total Prescribed Dose: 60 Gy
Reference Point Dosage Given to Date: 52 Gy
Reference Point Session Dosage Given: 2 Gy
Session Number: 25

## 2023-08-06 ENCOUNTER — Other Ambulatory Visit: Payer: Self-pay

## 2023-08-06 ENCOUNTER — Telehealth: Payer: Self-pay | Admitting: *Deleted

## 2023-08-06 ENCOUNTER — Ambulatory Visit
Admission: RE | Admit: 2023-08-06 | Discharge: 2023-08-06 | Disposition: A | Discharge status: Home / Self Care | Payer: PPO | Source: Ambulatory Visit | Attending: Radiation Oncology | Admitting: Radiation Oncology

## 2023-08-06 DIAGNOSIS — Z510 Encounter for antineoplastic radiation therapy: Secondary | ICD-10-CM | POA: Diagnosis not present

## 2023-08-06 DIAGNOSIS — C029 Malignant neoplasm of tongue, unspecified: Secondary | ICD-10-CM | POA: Diagnosis not present

## 2023-08-06 DIAGNOSIS — C021 Malignant neoplasm of border of tongue: Secondary | ICD-10-CM | POA: Diagnosis not present

## 2023-08-06 LAB — RAD ONC ARIA SESSION SUMMARY
Course Elapsed Days: 36
Plan Fractions Treated to Date: 27
Plan Prescribed Dose Per Fraction: 2 Gy
Plan Total Fractions Prescribed: 30
Plan Total Prescribed Dose: 60 Gy
Reference Point Dosage Given to Date: 54 Gy
Reference Point Session Dosage Given: 2 Gy
Session Number: 26

## 2023-08-06 MED ORDER — WARFARIN SODIUM 5 MG PO TABS: ORAL_TABLET | ORAL | 5 refills | Status: DC

## 2023-08-06 NOTE — Telephone Encounter (Signed)
Refill sent.

## 2023-08-06 NOTE — Telephone Encounter (Signed)
Needs new Rx for warfarin sent with new dose

## 2023-08-07 ENCOUNTER — Other Ambulatory Visit: Payer: Self-pay

## 2023-08-07 ENCOUNTER — Inpatient Hospital Stay: Payer: PPO | Admitting: Dietician

## 2023-08-07 ENCOUNTER — Ambulatory Visit: Admission: RE | Admit: 2023-08-07 | Payer: PPO | Source: Ambulatory Visit

## 2023-08-07 DIAGNOSIS — C029 Malignant neoplasm of tongue, unspecified: Secondary | ICD-10-CM | POA: Diagnosis not present

## 2023-08-07 DIAGNOSIS — C021 Malignant neoplasm of border of tongue: Secondary | ICD-10-CM | POA: Diagnosis not present

## 2023-08-07 DIAGNOSIS — Z510 Encounter for antineoplastic radiation therapy: Secondary | ICD-10-CM | POA: Diagnosis not present

## 2023-08-07 LAB — RAD ONC ARIA SESSION SUMMARY
Course Elapsed Days: 37
Plan Fractions Treated to Date: 28
Plan Prescribed Dose Per Fraction: 2 Gy
Plan Total Fractions Prescribed: 30
Plan Total Prescribed Dose: 60 Gy
Reference Point Dosage Given to Date: 56 Gy
Reference Point Session Dosage Given: 2 Gy
Session Number: 27

## 2023-08-07 NOTE — Progress Notes (Signed)
Nutrition Follow-up:  Patient with SCC of tongue. S/p left partial glossectomy with bilateral neck dissection 5/24 [facility]). He is receiving adjuvant radiation (start 7/10)   Low-profile Gtube placed 5/29 6/11-6/15[facility] admission due to PEG infection  DME - [employer]   Met with patient and wife following therapy. He is tolerating tube feedings at goal. Pt reports thick saliva. He is doing baking soda salt water rinses. Wife has brought low-profile feeding tube kit that was given at [facility] s/p placement (tube ext, 2 syringes, cath tip) Wife reports tube this RD attached is falling out. Pt agreeable to let RD look at tube. Feeding extension locked in place and functioning. Observed tube at insertion site is loose. There is no button to secure close to skin. Suspect balloon needs to be inflated.   Wife reports pt plans to remove tube at end of September at [facility] follow-up. Pt is not eating orally. RD educated on importance of wt maintenance with po diet prior to having tube removed. Wife states he is not even eating ice cream anymore. States remaining tongue is not strong enough to push food to the back of throat, even ice cream. He is tolerating liquids orally without difficulty. Pt is currently followed by SLP at [facility]. Last seen on 7/9 - noted cleared for liquids/puree at that time. Pt would prefer to receive post treatment ST closer to home as they live in [place].   Medications: reviewed   Labs: no new labs   Anthropometrics: Wt 155.8 lb on 8/12 increased   8/2 - 153.2 lb 7/22 - 153.4 lb 7/15 - 154.4 lb  6/28 - 155 lb 3.2 oz   Estimated Energy Needs   Kcals: 4540-9811 Protein: 85-106 Fluid: >/=2.1L   NUTRITION DIAGNOSIS: Inadequate oral intake continues - addressing via TF    INTERVENTION:  Continue giving 6 cartons Glucerna 1.5 via tube Pt to trial Ensure Plus + CIB this weekend and monitor blood sugar. If tolerated will begin weaning from bolus feedings Nurse navigator to reach  out to [facility] IR for possible tube evaluation vs returning to [facility] for this RD educated to seek medical care should tube fall out over the weekend - he agrees Given pt preference of continuing ST with provider closer to home - nurse navigator has placed referral with [facility] SLP    MONITORING, EVALUATION, GOAL: wt trends, intake, TF   NEXT VISIT: Tuesday August 20 after RT

## 2023-08-10 ENCOUNTER — Other Ambulatory Visit: Payer: Self-pay

## 2023-08-10 ENCOUNTER — Ambulatory Visit: Payer: PPO | Admitting: Nutrition

## 2023-08-10 ENCOUNTER — Ambulatory Visit
Admission: RE | Admit: 2023-08-10 | Discharge: 2023-08-10 | Disposition: A | Discharge status: Home / Self Care | Payer: PPO | Source: Ambulatory Visit | Attending: Radiation Oncology | Admitting: Radiation Oncology

## 2023-08-10 ENCOUNTER — Ambulatory Visit: Payer: PPO

## 2023-08-10 DIAGNOSIS — C021 Malignant neoplasm of border of tongue: Secondary | ICD-10-CM | POA: Diagnosis not present

## 2023-08-10 DIAGNOSIS — Z510 Encounter for antineoplastic radiation therapy: Secondary | ICD-10-CM | POA: Diagnosis not present

## 2023-08-10 DIAGNOSIS — C029 Malignant neoplasm of tongue, unspecified: Secondary | ICD-10-CM | POA: Diagnosis not present

## 2023-08-10 LAB — RAD ONC ARIA SESSION SUMMARY
Course Elapsed Days: 40
Plan Fractions Treated to Date: 29
Plan Prescribed Dose Per Fraction: 2 Gy
Plan Total Fractions Prescribed: 30
Plan Total Prescribed Dose: 60 Gy
Reference Point Dosage Given to Date: 58 Gy
Reference Point Session Dosage Given: 2 Gy
Session Number: 28

## 2023-08-10 NOTE — Progress Notes (Signed)
Patient and wife arrived in waiting area asking to speak to RD.  They are waiting to hear when his feeding tube can be checked.  It is loose but they are managing his feedings without difficulty.  Contacted nurse navigator who is in touch with IR to determine who can evaluate feeding tube.  Nurse navigator to follow-up with patient/wife by telephone.

## 2023-08-11 ENCOUNTER — Other Ambulatory Visit: Payer: Self-pay | Admitting: Radiation Oncology

## 2023-08-11 ENCOUNTER — Encounter (HOSPITAL_COMMUNITY): Payer: Self-pay | Admitting: Radiology

## 2023-08-11 ENCOUNTER — Other Ambulatory Visit: Payer: Self-pay

## 2023-08-11 ENCOUNTER — Ambulatory Visit: Admission: RE | Admit: 2023-08-11 | Payer: PPO | Source: Ambulatory Visit

## 2023-08-11 ENCOUNTER — Inpatient Hospital Stay: Payer: PPO | Admitting: Dietician

## 2023-08-11 ENCOUNTER — Ambulatory Visit (HOSPITAL_COMMUNITY)
Admission: RE | Admit: 2023-08-11 | Discharge: 2023-08-11 | Disposition: A | Discharge status: Home / Self Care | Payer: PPO | Source: Ambulatory Visit | Attending: Radiation Oncology | Admitting: Radiation Oncology

## 2023-08-11 DIAGNOSIS — C029 Malignant neoplasm of tongue, unspecified: Secondary | ICD-10-CM | POA: Diagnosis not present

## 2023-08-11 DIAGNOSIS — C021 Malignant neoplasm of border of tongue: Secondary | ICD-10-CM | POA: Diagnosis not present

## 2023-08-11 DIAGNOSIS — Z510 Encounter for antineoplastic radiation therapy: Secondary | ICD-10-CM | POA: Diagnosis not present

## 2023-08-11 HISTORY — PX: IR PATIENT EVAL TECH 0-60 MINS: IMG5564

## 2023-08-11 LAB — RAD ONC ARIA SESSION SUMMARY
Course Elapsed Days: 41
Plan Fractions Treated to Date: 30
Plan Prescribed Dose Per Fraction: 2 Gy
Plan Total Fractions Prescribed: 30
Plan Total Prescribed Dose: 60 Gy
Reference Point Dosage Given to Date: 60 Gy
Reference Point Session Dosage Given: 2 Gy
Session Number: 29

## 2023-08-11 NOTE — Progress Notes (Signed)
Oncology Nurse Navigator Documentation   Met with Jacob [name] after final RT to offer support and to celebrate end of radiation treatment.   Provided verbal/written post-RT guidance: Importance of keeping all follow-up appts, especially those with Nutrition and SLP. Importance of protecting treatment area from sun. Continuation of Sonafine application 2-3 times daily, application of antibiotic ointment to areas of raw skin; when supply of Sonafine exhausted transition to OTC lotion with vitamin E.  Explained my role as navigator will continue for several more months, encouraged him to call me with needs/concerns.    Hedda Slade RN, BSN, OCN Head & Neck Oncology Nurse Navigator [facility] Phone # [phone]  Fax # [phone]

## 2023-08-11 NOTE — Progress Notes (Signed)
Jacob [name] presents today for follow up after completing radiation treatment on 08-11-23 for tongue cancer.   Pain issues, if any: Denies any mouth or throat pain Using a feeding tube?: Yes--instilling 6  cartons of glucerna supplement daily. Reports some nausea after feedings Weight changes, if any:  Wt Readings from Last 3 Encounters:  08/25/23 156 lb 6 oz (70.9 kg)  07/23/23 154 lb 3.2 oz (69.9 kg)  06/19/23 155 lb 3.2 oz (70.4 kg)   Swallowing issues, if any: Yes--is not consuming anything by mouth (PEG continues to be main source of nutrition/hydration)  Smoking or chewing tobacco? None Using fluoride toothpaste daily? Yes Last ENT visit was on: Not since diagnosis Other notable issues, if any: Continues to deal with thick saliva and dry mouth. Skin in treatment field still mildly red, but otherwise appears intact and healed. Overall reports he's doing well and pleased with continued improvement

## 2023-08-11 NOTE — Progress Notes (Signed)
Patient ID: Jacob [name], male   DOB: [birth year], 72 y.o.   MRN: [id number] Pt presented to IR dept today for G tube assessment due to leakage. The tube was placed at [facility] and we currently do not have that version in stock in our dept . We did clean, dress the tube insertion site and refilled balloon with saline. Recommend that pt f/u with team at [facility] regarding possible tube exchange.

## 2023-08-11 NOTE — Procedures (Signed)
Patient was brought to the IR department for evaluation of his g-tube placed at [facility]. It was cleaned and assessed. The patient was seen by Jeananne Rama PA-C. It was determined that since [facility] did not have the specialized tube he required in stock, that the patient would return to [facility] for replacement. A new dry dressing was applied to the tube before the patient was escorted to the Cancer Center per his request.    See Lovie Chol note for further information.

## 2023-08-11 NOTE — Progress Notes (Signed)
Nutrition Follow-up:  Patient with SCC of tongue. S/p left partial glossectomy with bilateral neck dissection 5/24 [facility]). He is receiving adjuvant radiation (start 7/10)   Low-profile Gtube placed 5/29 6/11-6/15[facility] admission due to PEG infection  DME - [employer]   Met with patient and family in office after final radiation. Pt seen by IR this morning for evaluation of tube. Unfortunately tube exchange could not be completed as low profile tube not stocked. Pt is able to give bolus feedings without difficulty at this time. Pt reports increased fatigue. Thick saliva continues. Baking soda salt water rinses do not help with this. Pt has tried sprite rinse without improvement.    Medications: reviewed   Labs: no new labs  Anthropometrics: Wt 157.2 lb on 8/19 increased   8/12 - 155.8 lb  8/2 - 153.2 lb 7/22 - 153.4 lb 7/15 - 154.4 lb  6/28 - 155 lb 3.2 oz   Estimated Energy Needs   Kcals: 1324-4010 Protein: 85-106 Fluid: >/=2.1L   NUTRITION DIAGNOSIS: Inadequate oral intake continues - addressing via TF   INTERVENTION:  Continue 6 cartons Glucerna 1.5 via tube SLP evaluation planned 8/22 - diet advancement per ST    MONITORING, EVALUATION, GOAL: wt trends, TF, diet advancement   NEXT VISIT: Tuesday September 3

## 2023-08-12 ENCOUNTER — Ambulatory Visit: Payer: PPO | Attending: Cardiovascular Disease | Admitting: *Deleted

## 2023-08-12 DIAGNOSIS — I4891 Unspecified atrial fibrillation: Secondary | ICD-10-CM

## 2023-08-12 DIAGNOSIS — Z5189 Encounter for other specified aftercare: Secondary | ICD-10-CM | POA: Diagnosis not present

## 2023-08-12 DIAGNOSIS — Z931 Gastrostomy status: Secondary | ICD-10-CM | POA: Diagnosis not present

## 2023-08-12 DIAGNOSIS — Z5181 Encounter for therapeutic drug level monitoring: Secondary | ICD-10-CM | POA: Diagnosis not present

## 2023-08-12 LAB — POCT INR: INR: 1.9 — AB (ref 2.0–3.0)

## 2023-08-12 NOTE — Radiation Completion Notes (Signed)
Patient Name: Jacob [name], Jacob [name] MRN: [id number] Date of Birth: 05/03/1951 Referring Physician: Jeanie Sewer, M.D. Date of Service: 2023-08-12 Radiation Oncologist: Lonie Peak, M.D. 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 Staging on 2023-04-17: Squamous cell carcinoma of tongue (HCC) T=cT2, N=cN2c, M=cM0 Intent: Curative     ==========DELIVERED PLANS==========  First Treatment Date: 2023-06-26 - Last Treatment Date: 2023-08-11   Plan Name: HN_Tongue Site: Tongue Technique: IMRT Mode: Photon Dose Per Fraction: 2 Gy Prescribed Dose (Delivered / Prescribed): 60 Gy / 60 Gy Prescribed Fxs (Delivered / Prescribed): 30 / 30     ==========ON TREATMENT VISIT DATES========== 2023-07-06, 2023-07-13, 2023-07-20, 2023-07-24, 2023-08-03, 2023-08-10     ==========UPCOMING VISITS========== 2023-12-09 No Location Listed Wait List No Provider Listed        ==========APPENDIX - ON TREATMENT VISIT NOTES==========   See weekly On Treatment Notes in Epic for details.

## 2023-08-12 NOTE — Patient Instructions (Signed)
Take warfarin 3 tablets tonight then resume 2 tablets daily. S/P oral surgery on tongue 5/24. On 12oz of Glucerna 5 x day via PEG tube Started radiation 07/01/23. Recheck in 3 weeks

## 2023-08-13 ENCOUNTER — Ambulatory Visit: Payer: PPO

## 2023-08-22 DIAGNOSIS — C020 Malignant neoplasm of dorsal surface of tongue: Secondary | ICD-10-CM | POA: Diagnosis not present

## 2023-08-22 DIAGNOSIS — Z9049 Acquired absence of other specified parts of digestive tract: Secondary | ICD-10-CM | POA: Diagnosis not present

## 2023-08-22 DIAGNOSIS — Z930 Tracheostomy status: Secondary | ICD-10-CM | POA: Diagnosis not present

## 2023-08-25 ENCOUNTER — Inpatient Hospital Stay: Payer: PPO | Attending: Physician Assistant | Admitting: Dietician

## 2023-08-25 ENCOUNTER — Ambulatory Visit
Admission: RE | Admit: 2023-08-25 | Discharge: 2023-08-25 | Disposition: A | Discharge status: Home / Self Care | Payer: PPO | Source: Ambulatory Visit | Attending: Radiation Oncology | Admitting: Radiation Oncology

## 2023-08-25 VITALS — BP 115/81 | HR 82 | Temp 97.3°F | Resp 18 | Ht 73.0 in | Wt 156.4 lb

## 2023-08-25 DIAGNOSIS — Z7984 Long term (current) use of oral hypoglycemic drugs: Secondary | ICD-10-CM | POA: Insufficient documentation

## 2023-08-25 DIAGNOSIS — Z7901 Long term (current) use of anticoagulants: Secondary | ICD-10-CM | POA: Diagnosis not present

## 2023-08-25 DIAGNOSIS — Z923 Personal history of irradiation: Secondary | ICD-10-CM | POA: Diagnosis not present

## 2023-08-25 DIAGNOSIS — C021 Malignant neoplasm of border of tongue: Secondary | ICD-10-CM | POA: Insufficient documentation

## 2023-08-25 DIAGNOSIS — Z79899 Other long term (current) drug therapy: Secondary | ICD-10-CM | POA: Diagnosis not present

## 2023-08-25 DIAGNOSIS — C029 Malignant neoplasm of tongue, unspecified: Secondary | ICD-10-CM

## 2023-08-25 NOTE — Progress Notes (Signed)
Nutrition Follow-up:  Patient with SCC of tongue. S/p left partial glossectomy with bilateral neck dissection 5/24 [facility]). He is receiving adjuvant radiation (start 7/10)   Low-profile Gtube placed 5/29 6/11-6/15[facility] admission due to PEG infection  DME - [employer]   Met with pt and wife in office. Pt reports thick saliva persists, however this has been less the last couple of days. He denies sore throat/painful swallow. Pt reports trying to drink a Boost by mouth. The consistency was too thick. Pt has ST/PT scheduled 9/4. He is tolerating 6 cartons Glucerna. Reports 360 ml water flush TID. Pt reports occasional water stools. He denies nausea, vomiting.    Medications: reviewed   Labs: no new labs for review  Anthropometrics: Wt 156 lb 6 oz today  8/19 - 157.2 lb  8/12 - 155.8 lb  8/2 - 153.2 lb 7/22 - 153.4 lb 7/15 - 154.4 lb  6/28 - 155 lb 3.2 oz    Estimated Energy Needs   Kcals: 2841-3244 Protein: 85-106 Fluid: >/=2.1L   NUTRITION DIAGNOSIS: Inadequate oral intake continues - addressing via TF     INTERVENTION:  Continue 6 cartons Glucerna 1.5 via tube SLP evaluation planned 9/4 - diet advancement per ST  Support and encouragement   MONITORING, EVALUATION, GOAL: wt trends, intake   NEXT VISIT: No follow-up scheduled at this time

## 2023-08-25 NOTE — Therapy (Signed)
OUTPATIENT PHYSICAL THERAPY HEAD AND NECK POST RADIATION FOLLOW UP   Patient Name: Jacob [name] MRN: [id number] DOB:September 12, 1951, 72 y.o., male Today's Date: 08/26/2023  END OF SESSION:  PT End of Session - 08/26/23 1005     Visit Number 2    Number of Visits 2    Date for PT Re-Evaluation 09/17/23    PT Start Time 1004    PT Stop Time 1036    PT Time Calculation (min) 32 min    Activity Tolerance Patient tolerated treatment well    Behavior During Therapy [facility] for tasks assessed/performed             Past Medical History:  Diagnosis Date   A-fib (HCC)    CAD (coronary artery disease)    Cancer of tongue (HCC) 05/15/2023   Dyslipidemia    Essential (primary) hypertension    Gout    Hyperlipidemia, unspecified    Hypertension    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 one vessel disease,severe MR   CARDIAC CATHETERIZATION  01/19/2006   normal   CARDIAC CATHETERIZATION  06/06/2010   mild nonobstructive CAD   COLONOSCOPY WITH PROPOFOL N/A 09/23/2021   Procedure: COLONOSCOPY WITH PROPOFOL;  Surgeon: Lanelle Bal, DO;  Location: [facility];  Service: Endoscopy;  Laterality: N/A;  9:15am   cyst  1980   removed from wrist   ESOPHAGOGASTRODUODENOSCOPY (EGD) WITH PROPOFOL N/A 09/23/2021   Procedure: ESOPHAGOGASTRODUODENOSCOPY (EGD) WITH PROPOFOL;  Surgeon: Lanelle Bal, DO;  Location: [facility];  Service: Endoscopy;  Laterality: N/A;   IR PATIENT  EVAL TECH 0-60 MINS  08/11/2023   MITRAL VALVE SURGERY  12/11/2004   MV repair with Randa Evens ring,closure of PFO   POLYPECTOMY  09/23/2021   Procedure: POLYPECTOMY;  Surgeon: Lanelle Bal, DO;  Location: [facility];  Service: Endoscopy;;   RADIOFREQUENCY ABLATION  12/02/2004   Dr. 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   DOE (dyspnea on exertion) 04/26/2020   Chest pain with moderate risk for cardiac etiology 04/26/2020   CAD (coronary artery disease) 04/26/2020   Chronic anticoagulation 03/13/2020   Essential (primary) hypertension    Hyperlipidemia, unspecified    Other fatigue    Other specified soft tissue disorders    Rheumatic mitral valve disease, unspecified    Unspecified atrial fibrillation (HCC)    Atrial fibrillation (HCC) 11/03/2019   Encounter for therapeutic drug monitoring 11/03/2019   Diabetes mellitus type 2 in nonobese (HCC) 01/10/2016   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   Atypical atrial flutter (HCC) 05/04/2013   H/O mitral valve repair 05/04/2013   Gout 12/28/2012   Esophagitis 12/28/2012    PCP: Benita Stabile, MD   REFERRING PROVIDER: Maralyn Sago  Basilio Cairo, MD   REFERRING DIAG: C02.9 (ICD-10-CM) - Squamous cell carcinoma of tongue (HCC)   THERAPY DIAG:  Abnormal posture  Squamous cell carcinoma of tongue (HCC)  Rationale for Evaluation and Treatment: Rehabilitation  ONSET DATE: 05/15/23  SUBJECTIVE:                                                                                                                                                                                           SUBJECTIVE STATEMENT: I am mostly just tired. I have not been able to see a difference in my fatigue levels.   PERTINENT HISTORY:  SCC of the tongue, stage IVA (T2N2cM0) He presented to  Dr. Ellery Plunk at [facility] and Maxillofacial surgery on 03/19/23 for evaluation of an enlarging tongue lesion. Biopsy of the lateral tongue lesion collected by Dr. Ellery Plunk on 03/26/23 showing findings consistent with invasive well differentiated keratinizing SCC with evidence of PNI exceeding 4.0 cm. Tumor was noted to be present in all margins of the specimen. 04/10/23 He saw Dr. Ernestene Kiel at [facility] ENT to discuss options including glossectomy , left neck dissection and urgent referral was placed to [facility]. 04/15/23 PET which demonstrated: Primary and oral tongue and definite left-sided cervical adenopathy with possible contralateral right-sided adenopathy. There is nonspecific hypermetabolic activity in the floor of mouth. None of his lymph nodes are very large. He has a focus of hypermetabolic activity in the distal transverse colon but he does state that he had a colonoscopy 2 years ago. 05/15/23 Surgery consisting of partial left sided glossectomy, bilateral neck dissection, tracheostomy (later decannulated), left radial forearm free flap, split thickness skin graft. Pathology revealed: tumor the size of 3.5 cm; histology of moderately differentiated squamous cell carcinoma invading a depth of at least 20 mm; positive for PNI (largest involved nerve measuring 0.3 mm); negative for LVI; margins negative for carcinoma and dysplasia; nodal status of 1/34 lymph nodes positive for carcinoma. No ECE. He will receive 30 fractions of radiation to his Tongue and bilateral neck which started on 07/01/23 and will complete 08/11/23.   PATIENT GOALS:  Reassess how my recovery is going related to neck ROM, cervical pain, fatigue, and swelling.  PAIN:  Are you having pain? No  PRECAUTIONS: Recent radiation, Head and neck lymphedema risk,    OBJECTIVE:   POSTURE:  Forward head and rounded shoulders posture   30 SEC SIT TO STAND: 08/26/23: 13 reps in 30 sec without use of UEs which is close to average At eval: 10 reps  in 30 sec without use of UEs which is  Below average for patient's age   SHOULDER AROM:  [facility]     CERVICAL AROM:     Percent limited 08/26/23  Flexion 25% limited WFL  Extension 25% limited 40% limited  Right lateral flexion WFL 25% limited  Left lateral flexion WFL 25% limited  Right rotation [facility] WFL  Left rotation WFL WFL                          (Blank rows=not tested)    LYMPHEDEMA ASSESSMENT:    Circumference in cm  4 cm superior to sternal notch around neck 36.7  6 cm superior to sternal notch around neck 36  8 cm superior to sternal notch around neck 36  R lateral nostril from base of nose to medial tragus   L lateral nostril from base of nose to medial tragus   R corner of mouth to where ear lobe meets face   L corner of mouth to where ear lobe meets face   10 cm superior to sternal notch around neck 36.4     (Blank rows=not tested)   OTHER SYMPTOMS: Pain No Fibrosis No Pitting edema No Infections No Decreased scar mobility No  PATIENT EDUCATION:  Education details: scar massage, importance of walking program to decrease fatigue, monitor for signs/symptoms of lymphedema, post op exercises Person educated: Patient and Spouse Education method: Explanation, Demonstration, and Handouts Education comprehension: verbalized understanding  HOME EXERCISE PROGRAM: Reviewed previously given post op HEP.   ASSESSMENT:  CLINICAL IMPRESSION: Pt returns to PT after completing radiation to his tongue and bilateral neck for treatment of SCC of tongue. He reports he has not been doing his head and neck stretches that he was given at baseline eval. His neck ROM is more tight today. Re issued exercises and educated pt on importance of stretching to help improve ROM. Pt has been walking 10 min a day. Encouraged pt to slowly increase this over time to help decrease fatigue levels. Pt does not demonstrate any signs of lymphedema at this point. He was educated to return if he  continues to have increased fatigue or begins to develop swelling.    Pt will benefit from skilled therapeutic intervention to improve on the following deficits: decreased knowledge of condition and decreased ROM  PT treatment/interventions: ADL/Self care home management, Therapeutic exercises, Therapeutic activity, Patient/Family education, and Self Care     GOALS: Goals reviewed with patient? Yes  LONG TERM GOALS:  (STG=LTG)   GOALS Name Target Date  Goal status  1 Pt will demonstrate a return to baseline cervical ROM measurements and not demonstrate any signs or symptoms of lymphedema. Baseline: 08/26/23 PARTIALLY MET - 08/26/23- no signs of lymphedema, most ROM has returned to baseline - some tightness but pt has not been stretching and was re educated today in stretches        PLAN:  PT FREQUENCY/DURATION: d/c this visit  PLAN FOR NEXT SESSION: d/c this visit   [facility]  [address], [address]  [place] [address]  [phone]   Scar massage You can begin gentle scar massage to you incision sites. Gently place one hand on the incision and move the skin (without sliding on the skin) in various directions. Do this for a few minutes and then you can gently massage either coconut oil or vitamin E cream into the scars.  Home exercise Program Continue doing the exercises you were given until you feel like you can do them without feeling any tightness at the  end. It is best to do them for several months after completion of radiation since the effects of radiation continue past completion.   Walking Program Studies show that 30 minutes of walking per day (fast enough to elevate your heart rate) can significantly reduce the risk of a cancer recurrence. If you can't walk due to other medical reasons, we encourage you to find another activity you could do (like a stationary bike or water exercise).  Posture After treatment for head and neck cancer,  people frequently sit with rounded shoulders and forward head posture because the front of the neck has become tight and it feels better. If you sit like this, you can become very tight and have pain in sitting or standing with good posture. Try to be aware of your posture and sit and stand up tall to heal properly.  Follow up PT: Please let you doctor know as soon as possible if you develop any swelling in your face or neck in the future. Lymphedema (swelling) can occur months after completion of radiation. The sooner you can let the doctor know, the sooner they can refer you back to PT. It is much easier to treat the swelling early on.   Breslin Burklow Breedlove [place]on, PT 08/26/2023, 10:50 AM  PHYSICAL THERAPY DISCHARGE SUMMARY  Visits from Start of Care: 2  Current functional level related to goals / functional outcomes: See above   Remaining deficits: Some tightness with end ROM    Education / Equipment: HEP, scar massage, lymphedema, walking program   Patient agrees to discharge. Patient goals were partially met. Patient is being discharged due to meeting the stated rehab goals.  [facility] [place], [place] 08/26/23 10:50 AM

## 2023-08-25 NOTE — Progress Notes (Signed)
Radiation Oncology         [phone] ________________________________  Name: Jacob [name] MRN: [id number]  Date: 08/25/2023  DOB: Feb 18, 1951  Follow-Up Visit Note  CC: Benita Stabile, MD  Benita Stabile, MD  Diagnosis and Prior Radiotherapy:       ICD-10-CM   1. Squamous cell carcinoma of tongue (HCC)  C02.9 CT Chest W Contrast    CT Soft Tissue Neck W Contrast    CANCELED: CT Soft Tissue Neck W Contrast    CANCELED: CT Chest W Contrast      Cancer Staging  Squamous cell carcinoma of tongue (HCC) Staging form: Oral Cavity, AJCC 8th Edition - Clinical stage from 04/17/2023: Stage IVA (cT2, cN2c, cM0) - Signed by Lonie Peak, MD on 04/17/2023 Stage prefix: Initial diagnosis   CHIEF COMPLAINT:  Here for follow-up and surveillance of tongue cancer  ==========DELIVERED PLANS==========  First Treatment Date: 2023-06-26 - Last Treatment Date: 2023-08-11   Plan Name: HN_Tongue Site: Tongue Technique: IMRT Mode: Photon Dose Per Fraction: 2 Gy Prescribed Dose (Delivered / Prescribed): 60 Gy / 60 Gy Prescribed Fxs (Delivered / Prescribed): 30 / 30  Narrative:  The patient returns today for routine follow-up.  He completed his radiation treatment 2 weeks ago.               Pain issues, if any: Denies any mouth or throat pain Using a feeding tube?: Yes--instilling 6  cartons of glucerna supplement daily. Reports some nausea after feedings Weight changes, if any:     Wt Readings from Last 3 Encounters:  08/25/23 156 lb 6 oz (70.9 kg)  07/23/23 154 lb 3.2 oz (69.9 kg)  06/19/23 155 lb 3.2 oz (70.4 kg)    Swallowing issues, if any: Yes--is not consuming anything by mouth (PEG continues to be main source of nutrition/hydration)  Smoking or chewing tobacco? None Using fluoride toothpaste daily? Yes Last ENT visit was on: Not since diagnosis Other notable issues, if any: Continues to deal with thick saliva and dry mouth. Skin in treatment field still mildly red, but otherwise  appears intact and healed. Overall reports he's doing well and pleased with continued improvement  ALLERGIES:  is allergic to ace inhibitors, atenolol, cardizem [diltiazem hcl], latex, melatonin, metoprolol, and clindamycin/lincomycin.  Meds: Current Outpatient Medications  Medication Sig Dispense Refill   SODIUM FLUORIDE 5000 PPM 1.1 % PSTE Take 1 Application by mouth at bedtime.     acetaminophen (TYLENOL) 500 MG tablet Take 1,000 mg by mouth 2 (two) times daily as needed for mild pain or headache.     allopurinol (ZYLOPRIM) 300 MG tablet TAKE 1 TABLET BY MOUTH ONCE A DAY. 90 tablet 0   cholecalciferol (VITAMIN D3) 25 MCG (1000 UNIT) tablet Take 1,000 Units by mouth daily.     gabapentin (NEURONTIN) 100 MG capsule Take 100 mg by mouth 3 (three) times daily.     lidocaine (XYLOCAINE) 2 % solution Patient: Mix 1part 2% viscous lidocaine, 1part H20. Swish & swallow 10mL of diluted mixture, before meals and at bedtime, up to QID 200 mL 3   metFORMIN (GLUCOPHAGE) 1000 MG tablet Take 1,000 mg by mouth daily with breakfast.     nystatin (MYCOSTATIN) 100000 UNIT/ML suspension Take 5 mLs (500,000 Units total) by mouth 4 (four) times daily. Swish for 1 minute and then swallow. Continue for 12 days. 240 mL 0   omeprazole (PRILOSEC) 20 MG capsule Take 20 mg by mouth daily.     pioglitazone (  ACTOS) 15 MG tablet Take 15 mg by mouth daily.     rosuvastatin (CRESTOR) 20 MG tablet Take 20 mg by mouth daily.     sitaGLIPtin (JANUVIA) 100 MG tablet Take 100 mg by mouth daily.     torsemide (DEMADEX) 20 MG tablet Take 1-2 tablets (20-40 mg total) by mouth daily. (Patient taking differently: Take 20 mg by mouth daily.) 90 tablet 1   traMADol (ULTRAM) 50 MG tablet Take 1 tablet (50 mg total) by mouth every 6 (six) hours as needed. (Patient taking differently: Take 50 mg by mouth every 6 (six) hours as needed for moderate pain or severe pain.) 30 tablet 0   warfarin (COUMADIN) 5 MG tablet TAKE 2 TABLETS BY  MOUTH ONCE DAILY OR AS DIRECTED 60 tablet 5   No current facility-administered medications for this encounter.    Physical Findings: The patient is in no acute distress. Patient is alert and oriented. Wt Readings from Last 3 Encounters:  08/25/23 156 lb 6 oz (70.9 kg)  07/23/23 154 lb 3.2 oz (69.9 kg)  06/19/23 155 lb 3.2 oz (70.4 kg)    height is 6\' 1"  (1.854 m) and weight is 156 lb 6 oz (70.9 kg). His temporal temperature is 97.3 F (36.3 C) (abnormal). His blood pressure is 115/81 and his pulse is 82. His respiration is 18 and oxygen saturation is 100%. .  General: Alert and oriented, in no acute distress HEENT: Head is normocephalic. Extraocular movements are intact. Oropharynx is notable for tongue graft in place. Increase in oral/mucous secretions throughout the oral cavity. No concerning oral lesions.  Neck: Neck is notable for no palpable submental, cervical, or supraclavicular lymphadenopathy.  Skin: Skin in treatment fields shows satisfactory healing in the radiation fields with mild hyperpigmentation and dry skin.  Heart: Regular in rate and rhythm with no murmurs, rubs, or gallops. Chest: Clear to auscultation bilaterally, with no rhonchi, wheezes, or rales. Abdomen: Soft, nontender, nondistended, with no rigidity or guarding. Extremities: No cyanosis or edema. Lymphatics: see Neck Exam Psychiatric: Judgment and insight are intact. Affect is appropriate.   Lab Findings: Lab Results  Component Value Date   WBC 10.3 06/01/2023   HGB 11.8 (L) 06/01/2023   HCT 36.5 (L) 06/01/2023   MCV 88.0 06/01/2023   PLT 451 (H) 06/01/2023    Lab Results  Component Value Date   TSH 0.900 04/07/2023    Radiographic Findings: IR PATIENT EVAL TECH 0-60 MINS  Result Date: 08/11/2023 Arby Barrette     08/11/2023 10:14 AM Patient was brought to the IR department for evaluation of his g-tube placed at [facility]. It was cleaned and assessed. The patient was seen by Jeananne Rama  PA-C. It was determined that since [facility] did not have the specialized tube he required in stock, that the patient would return to [facility] for replacement. A new dry dressing was applied to the tube before the patient was escorted to the Cancer Center per his request.   See Lovie Chol note for further information.   Impression/Plan:    1) Head and Neck Cancer Status: Patient is recovering well from the radiation treatment. We are pleased with his progress to date.  He may try guaifenesin for thick secretions  2) Nutritional Status: Weight is stable, meeting with nutrition later today. Wt Readings from Last 3 Encounters:  08/25/23 156 lb 6 oz (70.9 kg)  07/23/23 154 lb 3.2 oz (69.9 kg)  06/19/23 155 lb 3.2 oz (70.4 kg)  PEG  tube: Using completely  3) Risk Factors: The patient has been educated about risk factors including alcohol and tobacco abuse; they understand that avoidance of alcohol and tobacco is important to prevent recurrences as well as other cancers  4) Swallowing: Meeting with SLP tomorrow for the first time.   5) Dental: Encouraged to continue regular followup with dentistry, and dental hygiene including fluoride rinses. He is requesting trays for his fluoride past today. Our team navigator is working on this.   6) Thyroid function: Will check annually  Lab Results  Component Value Date   TSH 0.900 04/07/2023    7) Other: Patient wants to know if he can start driving. Dr. Hezzie Bump has recommended that he stopped driving after his skin graft in May. From a radiation standpoint, there are no driving restrictions. Patient encouraged to follow-up with Dr. Hezzie Bump for driving clearance.   8) CT of the neck and chest scan in 2.5 months with a follow-up appointment to review the scan. The patient was encouraged to call with any issues or questions before then.  On date of service, in total, I spent 20 minutes on this encounter. Patient was seen in person. Note signed after  encounter date; minutes pertain to date of service, only.  _____________________________________   Joyice Faster, PA-C   Lonie Peak, MD

## 2023-08-26 ENCOUNTER — Encounter: Payer: Self-pay | Admitting: Physical Therapy

## 2023-08-26 ENCOUNTER — Other Ambulatory Visit: Payer: Self-pay

## 2023-08-26 ENCOUNTER — Ambulatory Visit: Payer: PPO | Attending: Radiation Oncology

## 2023-08-26 ENCOUNTER — Ambulatory Visit: Payer: PPO | Attending: Radiation Oncology | Admitting: Physical Therapy

## 2023-08-26 ENCOUNTER — Encounter: Payer: Self-pay | Admitting: Radiation Oncology

## 2023-08-26 DIAGNOSIS — C029 Malignant neoplasm of tongue, unspecified: Secondary | ICD-10-CM | POA: Diagnosis not present

## 2023-08-26 DIAGNOSIS — R1312 Dysphagia, oropharyngeal phase: Secondary | ICD-10-CM | POA: Insufficient documentation

## 2023-08-26 DIAGNOSIS — R471 Dysarthria and anarthria: Secondary | ICD-10-CM | POA: Diagnosis not present

## 2023-08-26 DIAGNOSIS — R293 Abnormal posture: Secondary | ICD-10-CM | POA: Insufficient documentation

## 2023-08-26 NOTE — Therapy (Signed)
OUTPATIENT SPEECH LANGUAGE PATHOLOGY ONCOLOGY EVALUATION   Patient Name: Jacob [name] MRN: [id number] DOB:03/03/1951, 72 y.o., male Today's Date: 08/26/2023  PCP: Benita Stabile, MD REFERRING PROVIDER: Lonie Peak, MD  END OF SESSION:  End of Session - 08/26/23 1637     Visit Number 1    Number of Visits 7    Date for SLP Re-Evaluation 11/24/23    SLP Start Time 1106    SLP Stop Time  1155    SLP Time Calculation (min) 49 min    Activity Tolerance Patient tolerated treatment well             Past Medical History:  Diagnosis Date   A-fib (HCC)    CAD (coronary artery disease)    Cancer of tongue (HCC) 05/15/2023   Dyslipidemia    Essential (primary) hypertension    Gout    Hyperlipidemia, unspecified    Hypertension    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 one vessel disease,severe MR   CARDIAC CATHETERIZATION  01/19/2006   normal   CARDIAC CATHETERIZATION  06/06/2010   mild nonobstructive CAD   COLONOSCOPY WITH PROPOFOL N/A 09/23/2021   Procedure: COLONOSCOPY WITH PROPOFOL;  Surgeon: Lanelle Bal, DO;  Location: [facility];  Service: Endoscopy;  Laterality: N/A;  9:15am   cyst  1980   removed from wrist   ESOPHAGOGASTRODUODENOSCOPY (EGD) WITH PROPOFOL N/A 09/23/2021   Procedure: ESOPHAGOGASTRODUODENOSCOPY (EGD) WITH PROPOFOL;  Surgeon: Lanelle Bal, DO;  Location: [facility];  Service: Endoscopy;  Laterality: N/A;   IR PATIENT EVAL  TECH 0-60 MINS  08/11/2023   MITRAL VALVE SURGERY  12/11/2004   MV repair with Randa Evens ring,closure of PFO   POLYPECTOMY  09/23/2021   Procedure: POLYPECTOMY;  Surgeon: Lanelle Bal, DO;  Location: [facility];  Service: Endoscopy;;   RADIOFREQUENCY ABLATION  12/02/2004   Dr. 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   DOE (dyspnea on exertion) 04/26/2020   Chest pain with moderate risk for cardiac etiology 04/26/2020   CAD (coronary artery disease) 04/26/2020   Chronic anticoagulation 03/13/2020   Essential (primary) hypertension    Hyperlipidemia, unspecified    Other fatigue    Other specified soft tissue disorders    Rheumatic mitral valve disease, unspecified    Unspecified atrial fibrillation (HCC)    Atrial fibrillation (HCC) 11/03/2019   Encounter for therapeutic drug monitoring 11/03/2019   Diabetes mellitus type 2 in nonobese (HCC) 01/10/2016   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 Pt accompanied by: significant other wife, Elease Hashimoto  PERTINENT HISTORY: SCC of the tongue, stage IVA (T2N2cM0) He presented to Dr. Ellery Plunk at [facility] on 03/19/23 for evaluation of an enlarging tongue lesion. Biopsy of the lateral tongue lesion collected by Dr. Ellery Plunk on 03/26/23 showing findings consistent with invasive well differentiated keratinizing SCC with evidence of PNI exceeding  4.0 cm. Tumor was noted to be present in all margins of the specimen. 04/10/23 He saw Dr. Ernestene Kiel at [facility] to discuss options including glossectomy , left neck dissection and urgent referral was placed to [facility]. 04/15/23 PET which demonstrated: 05/15/23 Surgery consisting of partial left sided glossectomy, bilateral neck dissection, tracheostomy (later decannulated), left radial forearm free flap, split thickness skin graft. Pathology revealed: tumor the size of 3.5 cm; histology of moderately differentiated squamous cell carcinoma invading a depth of at least 20 mm; positive for PNI (largest involved nerve measuring 0.3 mm); negative for LVI; margins negative for carcinoma and dysplasia; nodal status of 1/34 lymph nodes positive for carcinoma. No ECE. He will receive 30 fractions of radiation to his Tongue and bilateral neck which started on 07/01/23 and completed 08/11/23.   PAIN:  Are you having pain? No  FALLS: Has patient fallen in last 6 months?  See PT evaluation for details  LIVING ENVIRONMENT: Lives with: lives with their spouse Lives in: House/apartment  PLOF:  Level of assistance: Independent with ADLs, Independent with IADLs Employment: Retired  PATIENT GOALS: Oral nutrition   OBJECTIVE:   DIAGNOSTIC FINDINGS  RECOMMENDATIONS FROM OBJECTIVE SWALLOW STUDY (FEES):   Pt to have follow up swallow eval (FEES) at [facility] in approx 3 weeks, on 09/15/23.   Previous FEES: At the request of Dr. Hezzie Bump, a Flexible Endoscopic Evaluation of Swallowing (FEES) was conducted on June 30, 2023. Treatment Diagnosis: Dysphagia, oropharyngeal phase Impression:  Patient demonstrates improving in swallowing function with improved ability to transport and clear puree and liquid boluses if placed posterior within the oral cavity, as well as through the pharynx with additional swallows and puree/liquid bolus alternation. Airway protection is improved without aspiration. Patient is  pleased with his swallow progress. Encouraged patient to continue with puree and liquids throughout treatment with adherence to swallow guidelines/aspiration precautions. G-tube in place to supplement oral intake as needed.  Patient is scheduled to start radiation treatment tomorrow (OSH). SLP role in swallowing management during/after treatment discussed in detail. Swallow exercises provided. Plan for a repeat swallow evaluation 4-6 weeks post-treatment in coordination with ENT.  Recommendations: Diet: puree and full range of liquids encourage continued PO intake throughout treatment TFs via G-tube to supplement oral intake RD management during RT Aspiration precautions:  syringe vs straw (pt preference) alternate bites/sips and additional swallows as needed throat clear intermittently oral cavity for clearance as needed Medications: via alternate means  Additional Recommendations: Encouraged increased water intake to combat xerostomia and promote swallowing function and comfort Perform dysphagia therapy exercises daily, as directed FEES 4-6 weeks post treatment completion; coordinate with return ENT appointment   ------------------------ At the request of Dr. Hezzie Bump, a Flexible Endoscopic Evaluation of Swallowing (FEES) was conducted on June 10, 2023. Treatment Diagnosis: Dysphagia, oropharyngeal phase  Impression:  Patient presents with oral deficits post-surgery, as expected given anterior hemiglossectomy, in addition to pharyngeal deficits related to decreased bolus propulsion and several week  NPO status. Patient is able to compensate for oral deficits adequately by bolus placement to the posterior oral cavity (syringe or straw) and a slight head back posturing; pharyngeally, with multiple swallows to support bolus clearance and a volitional throat clear for airway clearance. Liquids were easier for patient to manipulate than pureed foods, although anticipate this will improve given  additional time post-surgery. For now, recommend patient initiate small amounts of liquids with adherence to aspiration precautions outlined below. We want patient to begin to swallow, but for now, he should continue TFs for primary nutrition, hydration, and medication. Upcoming RT [facility]); will have patient return prior to treatment to follow-up on swallowing function and provide additional swallow education/support in relation to RT.   Recommendations: Diet: initiate small amounts of liquids, daily; runny pureed foods as tolerated continue G-tube for primary nutrition, hydration and medication Aspiration precautions:  syringe vs straw (pt preference) small sips throat clear regularly suction oral cavity for clearance as needed  Additional Recommendations: Repeat swallow evaluation in ~3 weeks, prior to RT initiation   COGNITION: Overall cognitive status: Within functional limits for tasks assessed  LANGUAGE: Receptive and Expressive language appeared WNL.  ORAL MOTOR EXAMINATION: Overall status: Impaired: Lingual: Bilateral (ROM, Symmetry, Strength, Sensation, and Coordination) Velum: see below Mandible: appears WFL Comments: SLP appreciates surgical changes to lingual musculature. Pt states he feels a tingling intermittently in the lingual structure at this time. Today it was difficult to visualize velum due to surgical changes of the lingual musculature. Today pt able to move tongue structure very minimally in any direction (<32mm?) and majority of movement was observed at the root of tongue.  MOTOR SPEECH: Overall motor speech: impaired Level of impairment: Word Respiration: thoracic breathing and diaphragmatic/abdominal breathing Phonation: normal Resonance:  impaired due to surgical changes; VP port appears unaffected Articulation: Impaired: word Intelligibility: Intelligibility reduced Motor planning: Appears intact Motor speech errors: aware and  consistent Interfering components: anatomical limitations Effective technique: over articulate  CLINICAL SWALLOW ASSESSMENT:   Current diet: thin liquids, nectar thick liquids, and current diet modifications: placing bolus in posterior oral cavity but has not done solids in >3 weeks due to s/e of radiation tx. Pt uses straw for liquids. Usually suctions oral residue after swallowing. Objective recommended compensations: FROM JULY FEES: Alternate solids/liquids, throat clear intermittently, clear oral cavity as needed, syringe vs straw pt preference.  Dentition: missing dentition Patient directly observed with POs: Yes: thin liquids  Feeding: able to feed self Liquids provided by: straw Oral phase signs and symptoms:  oral residue consistently Pharyngeal phase signs and symptoms: delayed cough assumedly with oral residue spilling to pharynx; Consistent cough noted < 3 seconds after thyroid returning to resting position.  SLP suggested pt take at least 5 sequences of sips at LEAST twice daily - more if possible, to inhibit muscle disuse atrophy as well as muscle fibrosis.    TODAY'S TREATMENT:  DATE:  08/26/23 (eval): Research states the risk for dysphagia increases due to radiation and/or chemotherapy treatment due to a variety of factors, so SLP educated the pt about the possibility of reduced/limited ability for PO intake during rad tx. SLP also educated pt regarding possible changes to swallowing musculature after rad tx, and why adherence to dysphagia HEP provided today and PO consumption was necessary to inhibit muscle fibrosis following rad tx and to mitigate muscle disuse atrophy. SLP informed pt why this would be detrimental to their swallowing status and to their pulmonary health.  SLP then reviewed an individualized HEP from [facility] involving  jaw and  pharyngeal  strengthening and/or ROM. After SLP demonstration, SLP verified pt correctly performed each exercise. Pt was modified independent to perform HEP. Pt was instructed to complete this program with at least 20 reps/day, with no less than 5 reps at one time.  SLP suggested pt take at least 5 sequences of sips at LEAST twice daily - more if possible, to inhibit muscle disuse atrophy as well as muscle fibrosis. Pt was also strongly encouraged to exaggerate oral speech movements and the rationale provided for this. Pt demo'd understanding.   PATIENT EDUCATION: Education details: late effects head/neck radiation on swallow function, HEP procedure, and see "today's treatment" Person educated: Patient and Spouse Education method: Explanation, Demonstration, and Verbal cues Education comprehension: verbalized understanding, returned demonstration, verbal cues required, and needs further education   ASSESSMENT:  CLINICAL IMPRESSION: Patient is a 72 y.o. M who was seen today for assessment of swallowing as they have completed radiation therapy. Today pt drank thin liquids without overt s/s oral or pharyngeal difficulty. At this time pt swallowing is deemed WNL/WFL with these POs. No oral or overt s/sx pharyngeal deficits, including aspiration were observed. There are no overt s/s aspiration PNA observed by SLP nor any reported by pt at this time. Data indicate that pt's swallow ability will likely decrease over the course of radiation/chemoradiation therapy and could very well decline over time following the conclusion of that therapy due to muscle disuse atrophy and/or muscle fibrosis. Pt will cont to need to be seen by SLP in order to assess safety of PO intake, assess the need for recommending any objective swallow assessment, and ensuring pt is correctly completing the individualized HEP. SLP may suggest pt undergo follow up FEES/MBSS during this therapy course, after 09/15/23. Lastly, SLP suggested  to pt to exaggerate oral speech movements and then provided the rationale for this. Pt demo'd understanding.  OBJECTIVE IMPAIRMENTS: include dysarthria and dysphagia. These impairments are limiting patient from household responsibilities, ADLs/IADLs, effectively communicating at home and in community, and safety when swallowing. Factors affecting potential to achieve goals and functional outcome are severity of impairments. Patient will benefit from skilled SLP services to address above impairments and improve overall function.  REHAB POTENTIAL: Good   GOALS: Goals reviewed with patient? No  SHORT TERM GOALS: Target date: 10/26/23  Pt will perform HEP with independence x3 sessions Baseline: Goal status: INITIAL  2.  Pt will demo safe swallow strategies from latest FEES independently Baseline:  Goal status: INITIAL  3.  pt will tell SLP why pt is completing HEP with modified independence Baseline:  Goal status: INITIAL  4.  pt will describe 3 overt s/s aspiration PNA with modified independence Baseline:  Goal status: INITIAL  5.  pt will tell SLP how a food journal could hasten return to a more normalized diet Baseline:  Goal status: INITIAL  LONG TERM GOALS: Target date: 11/24/23  Pt will demo safe swallow strategies from latest FEES x2 sessions (after 09/15/23) Baseline:  Goal status: INITIAL  2.   pt will describe how to modify HEP over time, and the timeline associated with reduction in HEP frequency with modified independence over two sessions Baseline:  Goal status: INITIAL  3  When asked, pt will repeat utterance and use speech strategies to improve articulation and bring functional intelligibility 100%, in 3 sessions Baseline:  Goal status: INITIAL  4. If clinically appropriate, pt may undergo follow up MBSS or FEES after 09/15/23. Baseline:  Goal status: INITIAL   PLAN:  SLP FREQUENCY: every other week  SLP DURATION:  6 sessions  PLANNED INTERVENTIONS:  Aspiration precaution training, Pharyngeal strengthening exercises, Diet toleration management , Environmental controls, Trials of upgraded texture/liquids, Cueing hierachy, Internal/external aids, Oral motor exercises, Multimodal communication approach, SLP instruction and feedback, Compensatory strategies, Patient/family education, and Re-evaluation    Jahziah Simonin, CCC-SLP 08/26/2023, 4:40 PM

## 2023-08-27 NOTE — Progress Notes (Signed)
Oncology Nurse Navigator Documentation   I was notified by Jacob Littler RN that [name] had a question about fluoride trays to use with the fluoride gel provided by the Dentist, Dr. Steva Ready. I contacted Dr. Richardean Sale office and was told that he should use the scatter protective devices provided to use during radiation. I called [name] to provide him with this information and he told me that he had already thrown out the scatter protective devices. He sees a Theatre stage manager regularly and I advised him to discuss with them to see if they could make fluoride trays for him which he verbalized he will do. He has my direct contact information and knows to call me if he has any questions in the future.   Hedda Slade RN, BSN, OCN Head & Neck Oncology Nurse Navigator [facility] at [facility] Phone # [phone]  Fax # [phone]

## 2023-08-31 ENCOUNTER — Telehealth: Payer: Self-pay | Admitting: Radiation Oncology

## 2023-08-31 DIAGNOSIS — Z9049 Acquired absence of other specified parts of digestive tract: Secondary | ICD-10-CM | POA: Diagnosis not present

## 2023-08-31 DIAGNOSIS — C020 Malignant neoplasm of dorsal surface of tongue: Secondary | ICD-10-CM | POA: Diagnosis not present

## 2023-08-31 DIAGNOSIS — E119 Type 2 diabetes mellitus without complications: Secondary | ICD-10-CM | POA: Diagnosis not present

## 2023-08-31 NOTE — Telephone Encounter (Signed)
Jacob [name] OF PT'S FU W/ DR. Basilio Cairo [facility] FOR 11-10-23 @ 2:40 PM TO REVIEW CT SCAN RESULTS - SHE VOICED UNDERSTANDING

## 2023-09-03 ENCOUNTER — Ambulatory Visit: Payer: PPO | Attending: Cardiovascular Disease | Admitting: *Deleted

## 2023-09-03 DIAGNOSIS — I4891 Unspecified atrial fibrillation: Secondary | ICD-10-CM

## 2023-09-03 DIAGNOSIS — Z5181 Encounter for therapeutic drug level monitoring: Secondary | ICD-10-CM | POA: Diagnosis not present

## 2023-09-03 LAB — POCT INR: INR: 2.8 (ref 2.0–3.0)

## 2023-09-03 NOTE — Patient Instructions (Signed)
Continue warfarin 2 tablets daily. S/P oral surgery on tongue 5/24. On 12oz of Glucerna 5 x day via PEG tube Started radiation 07/01/23. Recheck in 4 weeks

## 2023-09-07 ENCOUNTER — Ambulatory Visit: Payer: PPO

## 2023-09-15 DIAGNOSIS — Z931 Gastrostomy status: Secondary | ICD-10-CM | POA: Diagnosis not present

## 2023-09-15 DIAGNOSIS — C029 Malignant neoplasm of tongue, unspecified: Secondary | ICD-10-CM | POA: Diagnosis not present

## 2023-09-15 DIAGNOSIS — R1312 Dysphagia, oropharyngeal phase: Secondary | ICD-10-CM | POA: Diagnosis not present

## 2023-09-21 DIAGNOSIS — Z930 Tracheostomy status: Secondary | ICD-10-CM | POA: Diagnosis not present

## 2023-09-21 DIAGNOSIS — Z9049 Acquired absence of other specified parts of digestive tract: Secondary | ICD-10-CM | POA: Diagnosis not present

## 2023-09-21 DIAGNOSIS — C020 Malignant neoplasm of dorsal surface of tongue: Secondary | ICD-10-CM | POA: Diagnosis not present

## 2023-09-22 ENCOUNTER — Ambulatory Visit: Payer: PPO | Attending: Radiation Oncology

## 2023-09-22 DIAGNOSIS — R1312 Dysphagia, oropharyngeal phase: Secondary | ICD-10-CM | POA: Diagnosis not present

## 2023-09-22 DIAGNOSIS — R471 Dysarthria and anarthria: Secondary | ICD-10-CM | POA: Insufficient documentation

## 2023-09-22 NOTE — Therapy (Signed)
OUTPATIENT SPEECH LANGUAGE PATHOLOGY ONCOLOGY TREATMENT   Patient Name: Jacob [name] MRN: [id number] DOB:1951-12-14, 72 y.o., male Today's Date: 09/22/2023  PCP: Benita Stabile, MD REFERRING PROVIDER: Lonie Peak, MD  END OF SESSION:  End of Session - 09/22/23 1024     Visit Number 2    Number of Visits 7    Date for SLP Re-Evaluation 11/24/23    SLP Start Time 1020    SLP Stop Time  1100    SLP Time Calculation (min) 40 min    Activity Tolerance Patient tolerated treatment well             Past Medical History:  Diagnosis Date   A-fib (HCC)    CAD (coronary artery disease)    Cancer of tongue (HCC) 05/15/2023   Dyslipidemia    Essential (primary) hypertension    Gout    Hyperlipidemia, unspecified    Hypertension    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 one vessel disease,severe MR   CARDIAC CATHETERIZATION  01/19/2006   normal   CARDIAC CATHETERIZATION  06/06/2010   mild nonobstructive CAD   COLONOSCOPY WITH PROPOFOL N/A 09/23/2021   Procedure: COLONOSCOPY WITH PROPOFOL;  Surgeon: Lanelle Bal, DO;  Location: [facility];  Service: Endoscopy;  Laterality: N/A;  9:15am   cyst  1980   removed from wrist   ESOPHAGOGASTRODUODENOSCOPY (EGD) WITH PROPOFOL N/A 09/23/2021   Procedure: ESOPHAGOGASTRODUODENOSCOPY (EGD) WITH PROPOFOL;  Surgeon: Lanelle Bal, DO;  Location: [facility];  Service: Endoscopy;  Laterality: N/A;   IR PATIENT EVAL  TECH 0-60 MINS  08/11/2023   MITRAL VALVE SURGERY  12/11/2004   MV repair with Randa Evens ring,closure of PFO   POLYPECTOMY  09/23/2021   Procedure: POLYPECTOMY;  Surgeon: Lanelle Bal, DO;  Location: [facility];  Service: Endoscopy;;   RADIOFREQUENCY ABLATION  12/02/2004   Dr. 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   DOE (dyspnea on exertion) 04/26/2020   Chest pain with moderate risk for cardiac etiology 04/26/2020   CAD (coronary artery disease) 04/26/2020   Chronic anticoagulation 03/13/2020   Essential (primary) hypertension    Hyperlipidemia, unspecified    Other fatigue    Other specified soft tissue disorders    Rheumatic mitral valve disease, unspecified    Unspecified atrial fibrillation (HCC)    Atrial fibrillation (HCC) 11/03/2019   Encounter for therapeutic drug monitoring 11/03/2019   Diabetes mellitus type 2 in nonobese (HCC) 01/10/2016   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 Pt accompanied by: significant other wife, Elease Hashimoto  PERTINENT HISTORY: SCC of the tongue, stage IVA (T2N2cM0) He presented to Dr. Ellery Plunk at [facility] and Maxillofacial surgery on 03/19/23 for evaluation of an enlarging tongue lesion. Biopsy of the lateral tongue lesion collected by Dr. Ellery Plunk on 03/26/23 showing findings consistent with invasive well differentiated keratinizing SCC with evidence of PNI  exceeding 4.0 cm. Tumor was noted to be present in all margins of the specimen. 04/10/23 He saw Dr. Ernestene Kiel at [facility] to discuss options including glossectomy , left neck dissection and urgent referral was placed to [facility]. 04/15/23 PET which demonstrated: 05/15/23 Surgery consisting of partial left sided glossectomy, bilateral neck dissection, tracheostomy (later decannulated), left radial forearm free flap, split thickness skin graft. Pathology revealed: tumor the size of 3.5 cm; histology of moderately differentiated squamous cell carcinoma invading a depth of at least 20 mm; positive for PNI (largest involved nerve measuring 0.3 mm); negative for LVI; margins negative for carcinoma and dysplasia; nodal status of 1/34 lymph nodes positive for carcinoma. No ECE. He will receive 30 fractions of radiation to his Tongue and bilateral neck which started on 07/01/23 and completed 08/11/23.   PAIN:  Are you having pain? No  FALLS: Has patient fallen in last 6 months?  See PT evaluation for details  LIVING ENVIRONMENT: Lives with: lives with their spouse Lives in: House/apartment  PLOF:  Level of assistance: Independent with ADLs, Independent with IADLs Employment: Retired  PATIENT GOALS: Oral nutrition   OBJECTIVE:   DIAGNOSTIC FINDINGS  ST visit with Anna Genre, SLP at [facility] 09/15/23 Impression:  Patient presents with moderate oropharyngeal dysphagia related to [facility] and associated treatment. He has been NPO for at least the past month related to treatment toxicities. These are improving, and patient is ready to resume oral intake. He is able to swallow liquids and runny pureed foods by placing boluses posterior within the oral cavity to compensate for structural changes and performing additional swallows, which patient performs as needed. Patient is appropriate to resume liquids and runny pureed foods with adherence to swallow/aspiration precautions. He has been set-up with  outpatient swallow therapy. Encourage continued treatment with PO goals in addition to strengthening exercises. We will plan for a repeat swallow evaluation here in 4 months in coordination with ENT, sooner as needed.   Recommendations: Diet: Resume runny puree and full range of liquids  TFs to supplement oral intake - encourage gradual reduction in TFs as PO intake improves Aspiration precautions:  syringe vs straw (pt preference) bolus placement posterior within the oral cavity alternate bites/sips and additional swallows as needed suction oral cavity for clearance as needed Medications:  via alternate means and/or via liquid form or crushed/mixed with puree   Additional Recommendations: Swallowing therapy - continue with treating therapist as scheduled Repeat FEES in coordination with ENT - scheduled today for 01/19/24   RECOMMENDATIONS FROM OBJECTIVE SWALLOW STUDY (FEES):   Previous FEES: At the request of Dr. Hezzie Bump, a Flexible Endoscopic Evaluation of Swallowing (FEES) was conducted on June 30, 2023. Treatment Diagnosis: Dysphagia, oropharyngeal phase Impression:  Patient demonstrates improving in swallowing function with improved ability to transport and clear puree and liquid boluses if placed posterior within the oral cavity, as well as through the pharynx with additional swallows and puree/liquid bolus alternation. Airway protection is improved  without aspiration. Patient is pleased with his swallow progress. Encouraged patient to continue with puree and liquids throughout treatment with adherence to swallow guidelines/aspiration precautions. G-tube in place to supplement oral intake as needed.  Patient is scheduled to start radiation treatment tomorrow (OSH). SLP role in swallowing management during/after treatment discussed in detail. Swallow exercises provided. Plan for a repeat swallow evaluation 4-6 weeks post-treatment in coordination with ENT.  Recommendations: Diet:  puree and full range of liquids encourage continued PO intake throughout treatment TFs via G-tube to supplement oral intake RD management during RT Aspiration precautions:  syringe vs straw (pt preference) alternate bites/sips and additional swallows as needed throat clear intermittently oral cavity for clearance as needed Medications: via alternate means  Additional Recommendations: Encouraged increased water intake to combat xerostomia and promote swallowing function and comfort Perform dysphagia therapy exercises daily, as directed FEES 4-6 weeks post treatment completion; coordinate with return ENT appointment   ------------------------ At the request of Dr. Hezzie Bump, a Flexible Endoscopic Evaluation of Swallowing (FEES) was conducted on June 10, 2023. Treatment Diagnosis: Dysphagia, oropharyngeal phase  Impression:  Patient presents with oral deficits post-surgery, as expected given anterior hemiglossectomy, in addition to pharyngeal deficits related to decreased bolus propulsion and several week NPO status. Patient is able to compensate for oral deficits adequately by bolus placement to the posterior oral cavity (syringe or straw) and a slight head back posturing; pharyngeally, with multiple swallows to support bolus clearance and a volitional throat clear for airway clearance. Liquids were easier for patient to manipulate than pureed foods, although anticipate this will improve given additional time post-surgery. For now, recommend patient initiate small amounts of liquids with adherence to aspiration precautions outlined below. We want patient to begin to swallow, but for now, he should continue TFs for primary nutrition, hydration, and medication. Upcoming RT [facility]); will have patient return prior to treatment to follow-up on swallowing function and provide additional swallow education/support in relation to RT.   Recommendations: Diet: initiate small amounts of liquids,  daily; runny pureed foods as tolerated continue G-tube for primary nutrition, hydration and medication Aspiration precautions:  syringe vs straw (pt preference) small sips throat clear regularly suction oral cavity for clearance as needed  Additional Recommendations: Repeat swallow evaluation in ~3 weeks, prior to RT initiation    TODAY'S TREATMENT:                                                                                                                                         DATE:  09/22/23:  CLINICAL SWALLOW ASSESSMENT:   Current diet: NPO, but SLP recommended wet puree and thin liquids Objective recommended compensations: Alternate solids/liquids, extra swallows PRN, syringe vs straw pt preference, meds in PEG or crushed in wet puree, or liquid form.  Dentition: missing dentition Patient directly observed with POs: Yes: thin liquids and wet puree  Feeding: able to feed self  Solids and liquids provided by: straw Oral phase signs and symptoms: oral residue consistently, pt req'd to place bolus in posterior oral cavity with a straw, needed to tilt head for bolus transfer to posterior oral cavity with liquids Pharyngeal phase signs and symptoms: delayed cough with thin liquids assumedly with oral residue spilling to pharynx  Wife and pt state Zachery Conch, [place], and [place] all stated pt would not be able to consume caloric intake adequate via PO for pt to sustain himself.  Pt performed HEP with independence. SLP educated pt about aspiration PNA s/sx and pt told SLP 3 with mod I. Pt also told SLP benefits of food journal after SLP explained this to pt.   08/26/23 (eval): Research states the risk for dysphagia increases due to radiation and/or chemotherapy treatment due to a variety of factors, so SLP educated the pt about the possibility of reduced/limited ability for PO intake during rad tx. SLP also educated pt regarding possible changes to swallowing musculature after rad tx, and why  adherence to dysphagia HEP provided today and PO consumption was necessary to inhibit muscle fibrosis following rad tx and to mitigate muscle disuse atrophy. SLP informed pt why this would be detrimental to their swallowing status and to their pulmonary health.  SLP then reviewed an individualized HEP from [facility] involving  jaw and pharyngeal  strengthening and/or ROM. After SLP demonstration, SLP verified pt correctly performed each exercise. Pt was modified independent to perform HEP. Pt was instructed to complete this program with at least 20 reps/day, with no less than 5 reps at one time.  SLP suggested pt take at least 5 sequences of sips at LEAST twice daily - more if possible, to inhibit muscle disuse atrophy as well as muscle fibrosis. Pt was also strongly encouraged to exaggerate oral speech movements and the rationale provided for this. Pt demo'd understanding.   PATIENT EDUCATION: Education details: late effects head/neck radiation on swallow function, HEP procedure, and see "today's treatment" Person educated: Patient and Spouse Education method: Explanation, Demonstration, and Verbal cues Education comprehension: verbalized understanding, returned demonstration, verbal cues required, and needs further education   ASSESSMENT:  CLINICAL IMPRESSION: Patient is a 72 y.o. M who was seen today for treatment of swallowing having completed radiation therapy. Today pt drank thin liquids and runny puree with one instance of delayed throat clear with 1/6 sips water. SLP told pt to decr sip size, which he did and reported less difficulty. At this time pt swallowing is deemed WNL/WFL with these POs when following precautions of alternate solids/liquids, extra swallows PRN, syringe vs straw pt preference, meds in PEG or crushed in wet puree, or liquid form.  There are no overt s/s aspiration PNA observed by SLP nor any reported by pt at this time. Data indicate that pt's swallow ability will  likely decrease over the course of radiation/chemoradiation therapy and could very well decline over time following the conclusion of that therapy due to muscle disuse atrophy and/or muscle fibrosis. Pt will cont to need to be seen by SLP in order to assess safety of PO intake, assess the need for recommending any objective swallow assessment, and ensuring pt is correctly completing the individualized HEP. SLP may suggest pt undergo follow up FEES/MBSS during this therapy course, after 09/15/23.  OBJECTIVE IMPAIRMENTS: include dysarthria and dysphagia. These impairments are limiting patient from household responsibilities, ADLs/IADLs, effectively communicating at home and in community, and safety when swallowing. Factors affecting potential to achieve goals and functional outcome are  severity of impairments. Patient will benefit from skilled SLP services to address above impairments and improve overall function.  REHAB POTENTIAL: Good   GOALS: Goals reviewed with patient? No  SHORT TERM GOALS: Target date: 10/26/23  Pt will perform HEP with independence x3 sessions Baseline: 09/22/23 Goal status: INITIAL  2.  Pt will demo safe swallow strategies from latest FEES independently Baseline:  Goal status: Met  3.  pt will tell SLP why pt is completing HEP with modified independence Baseline:  Goal status: Met  4.  pt will describe 3 overt s/s aspiration PNA with modified independence Baseline:  Goal status: Met  5.  pt will tell SLP how a food journal could hasten return to a more normalized diet Baseline:  Goal status: Met   LONG TERM GOALS: Target date: 11/24/23  Pt will demo safe swallow strategies from latest FEES x2 sessions (after 09/15/23) Baseline:  Goal status: INITIAL  2.   pt will describe how to modify HEP over time, and the timeline associated with reduction in HEP frequency with modified independence over two sessions Baseline:  Goal status: INITIAL  3  When asked, pt  will repeat utterance and use speech strategies to improve articulation and bring functional intelligibility 100%, in 3 sessions Baseline:  Goal status: INITIAL  4. If clinically appropriate, pt may undergo follow up MBSS or FEES after 09/15/23. Baseline:  Goal status: INITIAL   PLAN:  SLP FREQUENCY: every other week  SLP DURATION:  6 sessions  PLANNED INTERVENTIONS: Aspiration precaution training, Pharyngeal strengthening exercises, Diet toleration management , Environmental controls, Trials of upgraded texture/liquids, Cueing hierachy, Internal/external aids, Oral motor exercises, Multimodal communication approach, SLP instruction and feedback, Compensatory strategies, Patient/family education, and Re-evaluation    Tionna Gigante, CCC-SLP 09/22/2023, 10:24 AM

## 2023-09-22 NOTE — Patient Instructions (Signed)
   Signs of Aspiration Pneumonia   Chest pain/tightness Fever (can be low grade) Cough  With foul-smelling phlegm (sputum) With sputum containing pus or blood With greenish sputum Fatigue  Shortness of breath  Wheezing   **IF YOU HAVE THESE SIGNS, CONTACT YOUR DOCTOR OR GO TO THE EMERGENCY DEPARTMENT OR URGENT CARE AS SOON AS POSSIBLE**     

## 2023-09-29 ENCOUNTER — Telehealth: Payer: Self-pay | Admitting: *Deleted

## 2023-09-29 NOTE — Telephone Encounter (Signed)
CALLED PATIENT TO INFORM OF CT FOR 11-09-23- ARRIVAL TIME- 12 PM @ [facility] NO RESTRICTIONS TO TEST, PATIENT TO RECEIVE CT RESULTS FROM DR., SQUIRE ON 11-10-23 @ 2:40 PM, SPOKE WITH PATIENT AND HE IS AWARE OF THESE APPTS. AND THE INSTRUCTIONS

## 2023-10-01 ENCOUNTER — Ambulatory Visit: Payer: PPO | Attending: Cardiovascular Disease | Admitting: *Deleted

## 2023-10-01 DIAGNOSIS — I4891 Unspecified atrial fibrillation: Secondary | ICD-10-CM

## 2023-10-01 DIAGNOSIS — Z5181 Encounter for therapeutic drug level monitoring: Secondary | ICD-10-CM | POA: Diagnosis not present

## 2023-10-01 LAB — POCT INR: INR: 2.7 (ref 2.0–3.0)

## 2023-10-01 NOTE — Patient Instructions (Signed)
Continue warfarin 2 tablets daily. S/P oral surgery on tongue 5/24. On 12oz of Glucerna 5 x day via PEG tube Started radiation 07/01/23. Recheck in 6 weeks

## 2023-10-05 ENCOUNTER — Ambulatory Visit: Payer: PPO

## 2023-10-22 DIAGNOSIS — C020 Malignant neoplasm of dorsal surface of tongue: Secondary | ICD-10-CM | POA: Diagnosis not present

## 2023-10-22 DIAGNOSIS — Z9049 Acquired absence of other specified parts of digestive tract: Secondary | ICD-10-CM | POA: Diagnosis not present

## 2023-10-22 DIAGNOSIS — Z930 Tracheostomy status: Secondary | ICD-10-CM | POA: Diagnosis not present

## 2023-10-23 DIAGNOSIS — E119 Type 2 diabetes mellitus without complications: Secondary | ICD-10-CM | POA: Diagnosis not present

## 2023-10-23 DIAGNOSIS — C020 Malignant neoplasm of dorsal surface of tongue: Secondary | ICD-10-CM | POA: Diagnosis not present

## 2023-10-23 DIAGNOSIS — Z9049 Acquired absence of other specified parts of digestive tract: Secondary | ICD-10-CM | POA: Diagnosis not present

## 2023-10-27 ENCOUNTER — Ambulatory Visit: Payer: PPO | Attending: Radiation Oncology

## 2023-10-27 DIAGNOSIS — R471 Dysarthria and anarthria: Secondary | ICD-10-CM | POA: Insufficient documentation

## 2023-10-27 DIAGNOSIS — R1312 Dysphagia, oropharyngeal phase: Secondary | ICD-10-CM | POA: Diagnosis not present

## 2023-10-27 NOTE — Therapy (Signed)
OUTPATIENT SPEECH LANGUAGE PATHOLOGY ONCOLOGY TREATMENT   Patient Name: Jacob [name] MRN: [id number] DOB:1951/01/02, 72 y.o., male Today's Date: 10/27/2023  PCP: Benita Stabile, MD REFERRING PROVIDER: Lonie Peak, MD  END OF SESSION:  End of Session - 10/27/23 1244     Visit Number 3    Number of Visits 7    Date for SLP Re-Evaluation 11/24/23    SLP Start Time 1105    SLP Stop Time  1145    SLP Time Calculation (min) 40 min    Activity Tolerance Patient tolerated treatment well              Past Medical History:  Diagnosis Date   A-fib (HCC)    CAD (coronary artery disease)    Cancer of tongue (HCC) 05/15/2023   Dyslipidemia    Essential (primary) hypertension    Gout    Hyperlipidemia, unspecified    Hypertension    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 one vessel disease,severe MR   CARDIAC CATHETERIZATION  01/19/2006   normal   CARDIAC CATHETERIZATION  06/06/2010   mild nonobstructive CAD   COLONOSCOPY WITH PROPOFOL N/A 09/23/2021   Procedure: COLONOSCOPY WITH PROPOFOL;  Surgeon: Lanelle Bal, DO;  [facility]: [facility];  Service: Endoscopy;  Laterality: N/A;  9:15am   cyst  1980   removed from wrist   ESOPHAGOGASTRODUODENOSCOPY (EGD) WITH PROPOFOL N/A 09/23/2021   Procedure: ESOPHAGOGASTRODUODENOSCOPY (EGD) WITH PROPOFOL;  Surgeon: Lanelle Bal, DO;  [facility];  Service: Endoscopy;  Laterality: N/A;   IR PATIENT EVAL  TECH 0-60 MINS  08/11/2023   MITRAL VALVE SURGERY  12/11/2004   MV repair with Randa Evens ring,closure of PFO   POLYPECTOMY  09/23/2021   Procedure: POLYPECTOMY;  Surgeon: Lanelle Bal, DO;  [facility];  Service: Endoscopy;;   RADIOFREQUENCY ABLATION  12/02/2004   Dr. 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   DOE (dyspnea on exertion) 04/26/2020   Chest pain with moderate risk for cardiac etiology 04/26/2020   CAD (coronary artery disease) 04/26/2020   Chronic anticoagulation 03/13/2020   Essential (primary) hypertension    Hyperlipidemia, unspecified    Other fatigue    Other specified soft tissue disorders    Rheumatic mitral valve disease, unspecified    Unspecified atrial fibrillation (HCC)    Atrial fibrillation (HCC) 11/03/2019   Encounter for therapeutic drug monitoring 11/03/2019   Diabetes mellitus type 2 in nonobese (HCC) 01/10/2016   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  Pt accompanied by: self   PERTINENT HISTORY: SCC of the tongue, stage IVA ([id number]) He presented to Dr. Ellery Plunk at [facility] on 03/19/23 for evaluation of an enlarging tongue lesion. Biopsy of the lateral tongue lesion collected by Dr. Ellery Plunk on 03/26/23 showing findings consistent with invasive well differentiated keratinizing SCC with evidence of PNI exceeding 4.0 cm.  Tumor was noted to be present in all margins of the specimen. 04/10/23 He saw Dr. Ernestene Kiel at [facility] to discuss options including glossectomy , left neck dissection and urgent referral was placed to [facility]. 04/15/23 PET which demonstrated: 05/15/23 Surgery consisting of partial left sided glossectomy, bilateral neck dissection, tracheostomy (later decannulated), left radial forearm free flap, split thickness skin graft. Pathology revealed: tumor the size of 3.5 cm; histology of moderately differentiated squamous cell carcinoma invading a depth of at least 20 mm; positive for PNI (largest involved nerve measuring 0.3 mm); negative for LVI; margins negative for carcinoma and dysplasia; nodal status of 1/34 lymph nodes positive for carcinoma. No ECE. He will receive 30 fractions of radiation to his Tongue and bilateral neck which started on 07/01/23 and completed 08/11/23.   PAIN:  Are you having pain? No  FALLS: Has patient fallen in last 6 months?  See PT evaluation for details  LIVING ENVIRONMENT: Lives with: lives with their spouse Lives in: House/apartment  PLOF:  Level of assistance: Independent with ADLs, Independent with IADLs Employment: Retired  PATIENT GOALS: Oral nutrition   OBJECTIVE:   DIAGNOSTIC FINDINGS  ST visit with Anna Genre, SLP at [facility] 09/15/23 Impression:  Patient presents with moderate oropharyngeal dysphagia related to [facility] and associated treatment. He has been NPO for at least the past month related to treatment toxicities. These are improving, and patient is ready to resume oral intake. He is able to swallow liquids and runny pureed foods by placing boluses posterior within the oral cavity to compensate for structural changes and performing additional swallows, which patient performs as needed. Patient is appropriate to resume liquids and runny pureed foods with adherence to swallow/aspiration precautions. He has been set-up with outpatient swallow  therapy. Encourage continued treatment with PO goals in addition to strengthening exercises. We will plan for a repeat swallow evaluation here in 4 months in coordination with ENT, sooner as needed.   Recommendations: Diet: Resume runny puree and full range of liquids  TFs to supplement oral intake - encourage gradual reduction in TFs as PO intake improves Aspiration precautions:  syringe vs straw (pt preference) bolus placement posterior within the oral cavity alternate bites/sips and additional swallows as needed suction oral cavity for clearance as needed Medications:  via alternate means and/or via liquid form or crushed/mixed with puree   Additional Recommendations: Swallowing therapy - continue with treating therapist as scheduled Repeat FEES in coordination with ENT - scheduled today for 01/19/24   RECOMMENDATIONS FROM OBJECTIVE SWALLOW STUDY (FEES):   Previous FEES: At the request of Dr. Hezzie Bump, a Flexible Endoscopic Evaluation of Swallowing (FEES) was conducted on June 30, 2023. Treatment Diagnosis: Dysphagia, oropharyngeal phase Impression:  Patient demonstrates improving in swallowing function with improved ability to transport and clear puree and liquid boluses if placed posterior within the oral cavity, as well as through the pharynx with additional swallows and puree/liquid bolus alternation. Airway  protection is improved without aspiration. Patient is pleased with his swallow progress. Encouraged patient to continue with puree and liquids throughout treatment with adherence to swallow guidelines/aspiration precautions. G-tube in place to supplement oral intake as needed.  Patient is scheduled to start radiation treatment tomorrow ([facility]). SLP role in swallowing management during/after treatment discussed in detail. Swallow exercises provided. Plan for a repeat swallow evaluation 4-6 weeks post-treatment in coordination with ENT.  Recommendations: Diet: puree and full range of  liquids encourage continued PO intake throughout treatment TFs via G-tube to supplement oral intake RD management during RT Aspiration precautions:  syringe vs straw (pt preference) alternate bites/sips and additional swallows as needed throat clear intermittently oral cavity for clearance as needed Medications: via alternate means  Additional Recommendations: Encouraged increased water intake to combat xerostomia and promote swallowing function and comfort Perform dysphagia therapy exercises daily, as directed FEES 4-6 weeks post treatment completion; coordinate with return ENT appointment   ------------------------ At the request of Dr. Hezzie Bump, a Flexible Endoscopic Evaluation of Swallowing (FEES) was conducted on June 10, 2023. Treatment Diagnosis: Dysphagia, oropharyngeal phase  Impression:  Patient presents with oral deficits post-surgery, as expected given anterior hemiglossectomy, in addition to pharyngeal deficits related to decreased bolus propulsion and several week NPO status. Patient is able to compensate for oral deficits adequately by bolus placement to the posterior oral cavity (syringe or straw) and a slight head back posturing; pharyngeally, with multiple swallows to support bolus clearance and a volitional throat clear for airway clearance. Liquids were easier for patient to manipulate than pureed foods, although anticipate this will improve given additional time post-surgery. For now, recommend patient initiate small amounts of liquids with adherence to aspiration precautions outlined below. We want patient to begin to swallow, but for now, he should continue TFs for primary nutrition, hydration, and medication. Upcoming RT [facility]); will have patient return prior to treatment to follow-up on swallowing function and provide additional swallow education/support in relation to RT.   Recommendations: Diet: initiate small amounts of liquids, daily; runny pureed foods as  tolerated continue G-tube for primary nutrition, hydration and medication Aspiration precautions:  syringe vs straw (pt preference) small sips throat clear regularly suction oral cavity for clearance as needed  Additional Recommendations: Repeat swallow evaluation in ~3 weeks, prior to RT initiation    TODAY'S TREATMENT:                                                                                                                                         DATE:  10/27/23: SWALLOW: Pt has been eating applesauce once every two weeks. Reports he has done exercises x3/week average, 5 reps a day of each. SLP reiterated the goal of 20 reps/day for each exercise, and the rationale. Pt affirmed he would like to eat, so SLP stressed that he needed to incr his HEP completion.  With water consumed with  a straw, pt did so x3 without any overt s/sx aspiration, but had consistent oral residue. He spontaneously swallowed when he needed to do so today to manage oral saliva. SLP encouraged him to try POs more often at home with syringe. He will bring syringe next visit. SPEECH: SLP reiterated to pt about overarticulation to improve clarity of speech. Pt read 9-10 word sentences with 92% intelligibility. He responded to stimuli with sentence level responses with overarticulation 85%.   09/22/23:  CLINICAL SWALLOW ASSESSMENT:   Current diet: NPO, but SLP recommended wet puree and thin liquids Objective recommended compensations: Alternate solids/liquids, extra swallows PRN, syringe vs straw pt preference, meds in PEG or crushed in wet puree, or liquid form.  Dentition: missing dentition Patient directly observed with POs: Yes: thin liquids and wet puree  Feeding: able to feed self Solids and liquids provided by: straw Oral phase signs and symptoms: oral residue consistently, pt req'd to place bolus in posterior oral cavity with a straw, needed to tilt head for bolus transfer to posterior oral cavity with  liquids Pharyngeal phase signs and symptoms: delayed cough with thin liquids assumedly with oral residue spilling to pharynx  Wife and pt state Zachery Conch, [place], and [place] all stated pt would not be able to consume caloric intake adequate via PO for pt to sustain himself.  Pt performed HEP with independence. SLP educated pt about aspiration PNA s/sx and pt told SLP 3 with mod I. Pt also told SLP benefits of food journal after SLP explained this to pt.   08/26/23 (eval): Research states the risk for dysphagia increases due to radiation and/or chemotherapy treatment due to a variety of factors, so SLP educated the pt about the possibility of reduced/limited ability for PO intake during rad tx. SLP also educated pt regarding possible changes to swallowing musculature after rad tx, and why adherence to dysphagia HEP provided today and PO consumption was necessary to inhibit muscle fibrosis following rad tx and to mitigate muscle disuse atrophy. SLP informed pt why this would be detrimental to their swallowing status and to their pulmonary health.  SLP then reviewed an individualized HEP from [facility] involving  jaw and pharyngeal  strengthening and/or ROM. After SLP demonstration, SLP verified pt correctly performed each exercise. Pt was modified independent to perform HEP. Pt was instructed to complete this program with at least 20 reps/day, with no less than 5 reps at one time.  SLP suggested pt take at least 5 sequences of sips at LEAST twice daily - more if possible, to inhibit muscle disuse atrophy as well as muscle fibrosis. Pt was also strongly encouraged to exaggerate oral speech movements and the rationale provided for this. Pt demo'd understanding.   PATIENT EDUCATION: Education details: late effects head/neck radiation on swallow function, HEP procedure, and see "today's treatment" Person educated: Patient and Spouse Education method: Explanation, Demonstration, and Verbal cues Education  comprehension: verbalized understanding, returned demonstration, verbal cues required, and needs further education   ASSESSMENT:  CLINICAL IMPRESSION: Patient is a 72 y.o. M who was seen today for treatment of swallowing and also of speech intelligibility, having completed radiation therapy. Today pt drank thin liquids SMALL SIPS (with straw). At this time pt swallowing is deemed WNL/WFL with these type and amount of POs when following precautions of alternate solids/liquids, extra swallows PRN, syringe vs straw pt preference, meds in PEG or crushed in wet puree, or liquid form.  There are no overt s/s aspiration PNA observed by SLP nor any  reported by pt at this time. Data indicate that pt's swallow ability will likely decrease over the course of radiation/chemoradiation therapy and could very well decline over time following the conclusion of that therapy due to muscle disuse atrophy and/or muscle fibrosis. Pt will cont to need to be seen by SLP in order to assess safety of PO intake, assess the need for recommending any objective swallow assessment, and ensuring pt is correctly completing the individualized HEP. SLP may suggest pt undergo follow up FEES/MBSS during this therapy course, after 09/15/23.  OBJECTIVE IMPAIRMENTS: include dysarthria and dysphagia. These impairments are limiting patient from household responsibilities, ADLs/IADLs, effectively communicating at home and in community, and safety when swallowing. Factors affecting potential to achieve goals and functional outcome are severity of impairments. Patient will benefit from skilled SLP services to address above impairments and improve overall function.  REHAB POTENTIAL: Good   GOALS: Goals reviewed with patient? No  SHORT TERM GOALS: Target date: 10/26/23  Pt will perform HEP with independence x3 sessions Baseline: 09/22/23, 10/27/23 Goal status: Partially met  2.  Pt will demo safe swallow strategies from latest FEES  independently Baseline:  Goal status: Met  3.  pt will tell SLP why pt is completing HEP with modified independence Baseline:  Goal status: Met  4.  pt will describe 3 overt s/s aspiration PNA with modified independence Baseline:  Goal status: Met  5.  pt will tell SLP how a food journal could hasten return to a more normalized diet Baseline:  Goal status: Met   LONG TERM GOALS: Target date: 11/24/23  Pt will demo safe swallow strategies from latest FEES x2 sessions (after 09/15/23) Baseline:  Goal status: INITIAL  2.   pt will describe how to modify HEP over time, and the timeline associated with reduction in HEP frequency with modified independence over two sessions Baseline:  Goal status: INITIAL  3  When asked, pt will repeat utterance and use speech strategies to improve articulation and bring functional intelligibility 100%, in 3 sessions Baseline: 10/27/23, Goal status: INITIAL  4. If clinically appropriate, pt may undergo follow up MBSS or FEES after 09/15/23. Baseline:  Goal status: INITIAL   PLAN:  SLP FREQUENCY: every other week  SLP DURATION:  6 sessions  PLANNED INTERVENTIONS: Aspiration precaution training, Pharyngeal strengthening exercises, Diet toleration management , Environmental controls, Trials of upgraded texture/liquids, Cueing hierachy, Internal/external aids, Oral motor exercises, Multimodal communication approach, SLP instruction and feedback, Compensatory strategies, Patient/family education, and Re-evaluation    Osamu Olguin, CCC-SLP 10/27/2023, 12:44 PM

## 2023-11-02 ENCOUNTER — Ambulatory Visit (HOSPITAL_COMMUNITY)
Admission: RE | Admit: 2023-11-02 | Discharge: 2023-11-02 | Disposition: A | Discharge status: Home / Self Care | Payer: PPO | Source: Ambulatory Visit | Attending: Radiology | Admitting: Radiology

## 2023-11-02 ENCOUNTER — Encounter (HOSPITAL_COMMUNITY): Payer: Self-pay

## 2023-11-02 DIAGNOSIS — C029 Malignant neoplasm of tongue, unspecified: Secondary | ICD-10-CM | POA: Insufficient documentation

## 2023-11-02 DIAGNOSIS — J432 Centrilobular emphysema: Secondary | ICD-10-CM | POA: Diagnosis not present

## 2023-11-02 DIAGNOSIS — E119 Type 2 diabetes mellitus without complications: Secondary | ICD-10-CM | POA: Diagnosis not present

## 2023-11-02 DIAGNOSIS — C760 Malignant neoplasm of head, face and neck: Secondary | ICD-10-CM | POA: Diagnosis not present

## 2023-11-02 DIAGNOSIS — I700 Atherosclerosis of aorta: Secondary | ICD-10-CM | POA: Diagnosis not present

## 2023-11-02 DIAGNOSIS — R220 Localized swelling, mass and lump, head: Secondary | ICD-10-CM | POA: Diagnosis not present

## 2023-11-02 LAB — POCT I-STAT CREATININE: Creatinine, Ser: 0.8 mg/dL (ref 0.61–1.24)

## 2023-11-02 MED ORDER — IOHEXOL 300 MG/ML  SOLN
75.0000 mL | Freq: Once | INTRAMUSCULAR | Status: AC | PRN
  Administered 2023-11-02: 75 mL via INTRAVENOUS

## 2023-11-09 ENCOUNTER — Ambulatory Visit (HOSPITAL_COMMUNITY): Payer: PPO

## 2023-11-09 NOTE — Progress Notes (Incomplete)
Radiation Oncology         [phone] ________________________________  Name: Jacob [name] MRN: [id number]  Date: 11/10/2023  DOB: 09-17-1951  Follow-Up Visit Note  CC: Jacob Stabile, MD  Jacob Stabile, MD  Diagnosis and Prior Radiotherapy:    C02.1   ICD-10-CM   1. Squamous cell carcinoma of tongue (HCC)  C02.9     2. 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)  C02.1        Cancer Staging  Squamous cell carcinoma of tongue (HCC) Staging form: Oral Cavity, AJCC 8th Edition - Clinical stage from 04/17/2023: Stage IVA (cT2, cN2c, cM0) - Signed by Jacob Peak, MD on 04/17/2023 Stage prefix: Initial diagnosis   CHIEF COMPLAINT:  Here for follow-up and surveillance of tongue cancer  ==========DELIVERED PLANS==========  First Treatment Date: 2023-06-26 - Last Treatment Date: 2023-08-11   Plan Name: HN_Tongue Site: Tongue Technique: IMRT Mode: Photon Dose Per Fraction: 2 Gy Prescribed Dose (Delivered / Prescribed): 60 Gy / 60 Gy Prescribed Fxs (Delivered / Prescribed): 30 / 30  Narrative:  The patient returns today for routine follow-up.  He completed his radiation treatment 2 weeks ago.  [name] is here for a  follow-up appointment today. First Treatment Date: 2023-06-26 - Last Treatment Date: 2023-08-11  Pain issues, if any: Denies Using a feeding tube?:  Yes Weight changes, if WUJ:WJXBJY that his wt is staying the same. Swallowing issues, if any: Patient is npo  Smoking or chewing tobacco? no Using fluoride toothpaste daily? yes Last ENT visit was on: States that he went in [place] appointment in January 2025. Other notable issues, if any: Denies  any other issues Vitals:   11/10/23 1439  BP: 104/65  Pulse: 80  Resp: 18  Temp: (!) 97.1 F (36.2 C)  TempSrc: Temporal  SpO2: 100%  Weight: 160 lb 2 oz (72.6 kg)  Height: 6\' 1"  (1.854 m)               CT of the neck and chest showed no evidence of recurrent or metastatic disease.       ALLERGIES:  is allergic to ace inhibitors, atenolol, cardizem [diltiazem hcl], latex, melatonin, metoprolol, and clindamycin/lincomycin.  Meds: Current Outpatient Medications  Medication Sig Dispense Refill   acetaminophen (TYLENOL) 500 MG tablet Take 1,000 mg by mouth 2 (two) times daily as needed for mild pain or headache.     allopurinol (ZYLOPRIM) 300 MG tablet TAKE 1 TABLET BY MOUTH ONCE A DAY. 90 tablet 0   gabapentin (NEURONTIN) 100 MG capsule Take 100 mg by mouth 3 (three) times daily.     metFORMIN (GLUCOPHAGE) 1000 MG tablet Take 1,000 mg by mouth daily with breakfast.     pioglitazone (ACTOS) 15 MG tablet Take 15 mg by mouth daily.     rosuvastatin (CRESTOR) 20 MG tablet Take 20 mg by mouth daily.     sitaGLIPtin (JANUVIA) 100 MG tablet Take 100 mg by mouth daily.     SODIUM FLUORIDE 5000 PPM 1.1 % PSTE Take 1 Application by mouth at bedtime.     torsemide (DEMADEX) 20 MG tablet Take 1-2 tablets (20-40 mg total) by mouth daily. (Patient taking differently: Take 20 mg by mouth daily.) 90 tablet 1   warfarin (COUMADIN) 5 MG tablet TAKE 2 TABLETS BY MOUTH ONCE DAILY OR AS DIRECTED 60 tablet 5   cholecalciferol (VITAMIN D3) 25 MCG (1000 UNIT) tablet Take 1,000 Units by mouth daily. (Patient not taking: Reported on 11/10/2023)  lidocaine (XYLOCAINE) 2 % solution Patient: Mix 1part 2% viscous lidocaine, 1part H20. Swish & swallow 10mL of diluted mixture, before meals and at bedtime, up to QID (Patient not taking: Reported on 11/10/2023) 200 mL 3   nystatin (MYCOSTATIN) 100000 UNIT/ML suspension Take 5 mLs (500,000 Units total) by mouth 4 (four) times daily. Swish for 1 minute and then swallow. Continue for 12 days. (Patient not taking: Reported on 11/10/2023) 240 mL 0   omeprazole (PRILOSEC) 20 MG capsule Take 20 mg by mouth daily.     traMADol (ULTRAM) 50 MG tablet Take 1 tablet (50 mg total) by mouth every 6 (six) hours as needed. (Patient not taking: Reported on 11/10/2023) 30  tablet 0   No current facility-administered medications for this encounter.    Physical Findings: The patient is in no acute distress. Patient is alert and oriented. Wt Readings from Last 3 Encounters:  11/10/23 160 lb 2 oz (72.6 kg)  08/25/23 156 lb 6 oz (70.9 kg)  07/23/23 154 lb 3.2 oz (69.9 kg)    height is 6\' 1"  (1.854 m) and weight is 160 lb 2 oz (72.6 kg). His temporal temperature is 97.1 F (36.2 C) (abnormal). His blood pressure is 104/65 and his pulse is 80. His respiration is 18 and oxygen saturation is 100%. .  General: Alert and oriented, in no acute distress HEENT: Head is normocephalic. Extraocular movements are intact. Oropharynx is notable for tongue graft in place. thick oral/mucous secretions throughout the oral cavity. No concerning oral lesions.  Neck: Neck is notable for no palpable submental, cervical, or supraclavicular lymphadenopathy.  No edema. Skin: Skin in treatment fields shows satisfactory healing in the radiation fields  Extremities: No cyanosis or edema. Lymphatics: see Neck Exam Psychiatric: Judgment and insight are intact. Affect is appropriate.   Lab Findings: Lab Results  Component Value Date   WBC 10.3 06/01/2023   HGB 11.8 (L) 06/01/2023   HCT 36.5 (L) 06/01/2023   MCV 88.0 06/01/2023   PLT 451 (H) 06/01/2023    Lab Results  Component Value Date   TSH 0.900 04/07/2023    Radiographic Findings: CT Chest W Contrast  Result Date: 11/09/2023 CLINICAL DATA:  Tongue cancer, chemotherapy and radiation therapy complete. * Tracking Code: BO * EXAM: CT CHEST WITH CONTRAST TECHNIQUE: Multidetector CT imaging of the chest was performed during intravenous contrast administration. RADIATION DOSE REDUCTION: This exam was performed according to the departmental dose-optimization program which includes automated exposure control, adjustment of the mA and/or kV according to patient size and/or use of iterative reconstruction technique. CONTRAST:  75mL  OMNIPAQUE IOHEXOL 300 MG/ML  SOLN COMPARISON:  PET 04/15/2023 and CT chest 11/28/2021. FINDINGS: Cardiovascular: Atherosclerotic calcification of the aorta and aortic valve. Heart size normal. No pericardial effusion. Mediastinum/Nodes: No pathologically enlarged mediastinal, hilar or axillary lymph nodes. Esophagus is grossly unremarkable. Lungs/Pleura: Biapical pleuroparenchymal scarring. Calcified granulomas. Tiny subpleural nodules measure 3 mm or less in size, unchanged and benign. Minimal subsegmental and dependent atelectasis in the lower lobes. Mild centrilobular emphysema. No pleural fluid. Airway is unremarkable. Upper Abdomen: Probable tiny cysts in the liver. Percutaneous gastrostomy. Visualized portions of the liver, gallbladder, adrenal glands, kidneys, spleen, pancreas, stomach and bowel are otherwise grossly unremarkable. No upper abdominal adenopathy. Musculoskeletal: Minimal degenerative change in the spine. IMPRESSION: 1. No evidence of metastatic disease. 2.  Aortic atherosclerosis (ICD10-I70.0). 3.  Emphysema (ICD10-J43.9). 4. CT neck dictated separately. Electronically Signed   By: Leanna Battles M.D.   On: 11/09/2023 09:00  CT Soft Tissue Neck W Contrast  Result Date: 11/09/2023 CLINICAL DATA:  Head neck cancer, monitoring. History of tongue cancer diagnosed in 2024. EXAM: CT NECK WITH CONTRAST TECHNIQUE: Multidetector CT imaging of the neck was performed using the standard protocol following the bolus administration of intravenous contrast. RADIATION DOSE REDUCTION: This exam was performed according to the departmental dose-optimization program which includes automated exposure control, adjustment of the mA and/or kV according to patient size and/or use of iterative reconstruction technique. CONTRAST:  75mL OMNIPAQUE IOHEXOL 300 MG/ML  SOLN COMPARISON:  04/30/2023 FINDINGS: Pharynx and larynx: Extensive architectural distortion after anterior tongue resection and grafting. No  definitive primary recurrence, there is significant limitation due to the degree of artifact from tooth amalgam. Salivary glands: Post treatment atrophy. Thyroid: Normal Lymph nodes: None enlarged or heterogeneous. Vascular: Atheromatous calcification diffusely scratched atheromatous carotid wall thickening diffusely. Sequela of free flap anastomoses. Limited intracranial: Negative Visualized orbits: Negative Mastoids and visualized paranasal sinuses: Clear Skeleton: No acute or aggressive finding Upper chest: Reported separately IMPRESSION: First baseline after tongue mass resection with graft. No evidence of recurrent disease. Electronically Signed   By: Tiburcio Pea M.D.   On: 11/09/2023 08:47    Impression/Plan:    1) Head and Neck Cancer Status: NED per imaging and exam  2) Nutritional Status: Weight is stable to increased - continue to follow w SLP and nutrition Wt Readings from Last 3 Encounters:  11/10/23 160 lb 2 oz (72.6 kg)  08/25/23 156 lb 6 oz (70.9 kg)  07/23/23 154 lb 3.2 oz (69.9 kg)   Using PEG  3) Risk Factors: The patient has been educated about risk factors including alcohol and tobacco abuse; they understand that avoidance of alcohol and tobacco is important to prevent recurrences as well as other cancers    4) Swallowing: Meeting with SLP - continue.  5) Dental: Encouraged to continue regular followup with dentistry, and dental hygiene including fluoride supplements   6) Thyroid function: Will check annually  - check at next visit Lab Results  Component Value Date   TSH 0.900 04/07/2023    7) Next visit:  f/u in April w/ TSH lab.  On date of service, in total, I spent 30 minutes on this encounter. Patient was seen in person.      Jacob Peak, MD

## 2023-11-10 ENCOUNTER — Ambulatory Visit
Admission: RE | Admit: 2023-11-10 | Discharge: 2023-11-10 | Disposition: A | Discharge status: Home / Self Care | Payer: PPO | Source: Ambulatory Visit | Attending: Radiation Oncology | Admitting: Radiation Oncology

## 2023-11-10 ENCOUNTER — Encounter: Payer: Self-pay | Admitting: Radiation Oncology

## 2023-11-10 ENCOUNTER — Ambulatory Visit: Payer: PPO | Attending: Cardiovascular Disease | Admitting: *Deleted

## 2023-11-10 ENCOUNTER — Inpatient Hospital Stay: Payer: PPO | Attending: Physician Assistant | Admitting: Dietician

## 2023-11-10 VITALS — BP 104/65 | HR 80 | Temp 97.1°F | Resp 18 | Ht 73.0 in | Wt 160.1 lb

## 2023-11-10 DIAGNOSIS — Z923 Personal history of irradiation: Secondary | ICD-10-CM | POA: Diagnosis not present

## 2023-11-10 DIAGNOSIS — I700 Atherosclerosis of aorta: Secondary | ICD-10-CM | POA: Insufficient documentation

## 2023-11-10 DIAGNOSIS — Z7984 Long term (current) use of oral hypoglycemic drugs: Secondary | ICD-10-CM | POA: Diagnosis not present

## 2023-11-10 DIAGNOSIS — Z5181 Encounter for therapeutic drug level monitoring: Secondary | ICD-10-CM | POA: Diagnosis not present

## 2023-11-10 DIAGNOSIS — C029 Malignant neoplasm of tongue, unspecified: Secondary | ICD-10-CM

## 2023-11-10 DIAGNOSIS — I4891 Unspecified atrial fibrillation: Secondary | ICD-10-CM | POA: Diagnosis not present

## 2023-11-10 DIAGNOSIS — Z7901 Long term (current) use of anticoagulants: Secondary | ICD-10-CM | POA: Diagnosis not present

## 2023-11-10 DIAGNOSIS — C021 Malignant neoplasm of border of tongue: Secondary | ICD-10-CM | POA: Insufficient documentation

## 2023-11-10 DIAGNOSIS — Z79899 Other long term (current) drug therapy: Secondary | ICD-10-CM | POA: Diagnosis not present

## 2023-11-10 DIAGNOSIS — J432 Centrilobular emphysema: Secondary | ICD-10-CM | POA: Diagnosis not present

## 2023-11-10 LAB — POCT INR: INR: 2.5 (ref 2.0–3.0)

## 2023-11-10 NOTE — Progress Notes (Signed)
[name] is here for a  follow-up appointment today. First Treatment Date: 2023-06-26 - Last Treatment Date: 2023-08-11  Pain issues, if any: Denies Using a feeding tube?:  Yes Weight changes, if WUJ:WJXBJY that his wt is staying the same. Swallowing issues, if any: Patient is npo  Smoking or chewing tobacco? no Using fluoride toothpaste daily? yes Last ENT visit was on: States that he went in [place] appointment in January 2025. Other notable issues, if any: Denies  any other issues Vitals:   11/10/23 1439  BP: 104/65  Pulse: 80  Resp: 18  Temp: (!) 97.1 F (36.2 C)  TempSrc: Temporal  SpO2: 100%  Weight: 72.6 kg  Height: 6\' 1"  (1.854 m)

## 2023-11-10 NOTE — Progress Notes (Signed)
Nutrition Follow-up:  Patient with SCC of tongue. S/p left partial glossectomy with bilateral neck dissection 5/24 [facility]). He completed adjuvant radiation (start 08/11/23) under the care of Dr. Sherlynn Carbon Gtube placed 5/29 6/11-6/15- [facility] admission due to PEG infection  DME - [employer]   Met with patient and wife in office following MD office visit. Patient tearful this afternoon. Reports Dr. Basilio Cairo discussed recent scans with him today and he is in remission. Patient is very thankful. He is unable to eat orally. Patient reports surgeon informed him that he would like remain tube dependent. He is giving 6 cartons Glucerna/day. Patient is working with SLP at [facility]. He is completed swallowing exercises, but not as much as prescribed. Patient will work to increase reps to meet goals. Patient reports he is tolerating thin liquids (water, hot chocolate, tea). Occasionally will have applesauce. Patient denies nausea, vomiting, diarrhea, constipation.   Medications: reviewed   Labs: no new labs   Anthropometrics: Wt 160 lb 2 oz today increased   9/3 - 156 lb 6 oz  8/19 - 157.2 lb  8/12 - 155.8 lb   Estimated Energy Needs   Kcals: 7829-5621 Protein: 85-106 Fluid: >/=2.1L   NUTRITION DIAGNOSIS: Inadequate oral intake continues - addressing via TF     INTERVENTION:  Continue 6 cartons Glucerna 1.5 via tube Encouraged completing daily swallowing exercises as prescribed - diet advancement per ST  Support and encouragement    MONITORING, EVALUATION, GOAL: wt trends, intake     NEXT VISIT: No follow-up scheduled at this time. Patient has contact information.

## 2023-11-10 NOTE — Patient Instructions (Signed)
Continue warfarin 2 tablets daily. S/P oral surgery on tongue 5/24. On 12oz of Glucerna 5 x day via PEG tube Started radiation 07/01/23. Recheck in 6 weeks

## 2023-11-10 NOTE — Progress Notes (Signed)
Oncology Nurse Navigator Documentation   I met with Jacob [name] and his wife during his post treatment follow up with Dr. Basilio Cairo today. He is doing well and recovering from his head and neck cancer treatment. He will continue to follow with Dr. Hezzie Bump in January and will see Dr. Basilio Cairo again in April of next year with a TSH lab beforehand. They have my contact number and know to call if there is anything they need.   Hedda Slade RN, BSN, OCN Head & Neck Oncology Nurse Navigator [facility] Phone # [phone]  Fax # [phone]

## 2023-11-13 ENCOUNTER — Other Ambulatory Visit: Payer: Self-pay

## 2023-11-13 DIAGNOSIS — C029 Malignant neoplasm of tongue, unspecified: Secondary | ICD-10-CM

## 2023-11-13 DIAGNOSIS — R5381 Other malaise: Secondary | ICD-10-CM

## 2023-11-21 DIAGNOSIS — C020 Malignant neoplasm of dorsal surface of tongue: Secondary | ICD-10-CM | POA: Diagnosis not present

## 2023-11-21 DIAGNOSIS — Z9049 Acquired absence of other specified parts of digestive tract: Secondary | ICD-10-CM | POA: Diagnosis not present

## 2023-11-21 DIAGNOSIS — Z930 Tracheostomy status: Secondary | ICD-10-CM | POA: Diagnosis not present

## 2023-11-25 ENCOUNTER — Ambulatory Visit: Payer: PPO

## 2023-11-25 DIAGNOSIS — Z9049 Acquired absence of other specified parts of digestive tract: Secondary | ICD-10-CM | POA: Diagnosis not present

## 2023-11-25 DIAGNOSIS — E119 Type 2 diabetes mellitus without complications: Secondary | ICD-10-CM | POA: Diagnosis not present

## 2023-11-25 DIAGNOSIS — C020 Malignant neoplasm of dorsal surface of tongue: Secondary | ICD-10-CM | POA: Diagnosis not present

## 2023-12-01 ENCOUNTER — Telehealth: Payer: Self-pay | Admitting: Cardiovascular Disease

## 2023-12-01 NOTE — Telephone Encounter (Signed)
Thank you.  Sharing with Vashti Hey who manages his INR.

## 2023-12-01 NOTE — Telephone Encounter (Signed)
New Message:    Shawna Orleans called. she wanted Dr C to know that the manufacture had to be changed for the patient's Warfarin.She said to please notate this in the patient's record.

## 2023-12-09 ENCOUNTER — Ambulatory Visit: Payer: PPO | Admitting: Cardiovascular Disease

## 2023-12-22 DIAGNOSIS — Z930 Tracheostomy status: Secondary | ICD-10-CM | POA: Diagnosis not present

## 2023-12-22 DIAGNOSIS — C020 Malignant neoplasm of dorsal surface of tongue: Secondary | ICD-10-CM | POA: Diagnosis not present

## 2023-12-22 DIAGNOSIS — Z9049 Acquired absence of other specified parts of digestive tract: Secondary | ICD-10-CM | POA: Diagnosis not present

## 2023-12-25 ENCOUNTER — Ambulatory Visit: Payer: PPO | Attending: Cardiovascular Disease | Admitting: *Deleted

## 2023-12-25 DIAGNOSIS — C020 Malignant neoplasm of dorsal surface of tongue: Secondary | ICD-10-CM | POA: Diagnosis not present

## 2023-12-25 DIAGNOSIS — Z5181 Encounter for therapeutic drug level monitoring: Secondary | ICD-10-CM | POA: Diagnosis not present

## 2023-12-25 DIAGNOSIS — Z9049 Acquired absence of other specified parts of digestive tract: Secondary | ICD-10-CM | POA: Diagnosis not present

## 2023-12-25 DIAGNOSIS — I4891 Unspecified atrial fibrillation: Secondary | ICD-10-CM

## 2023-12-25 DIAGNOSIS — E119 Type 2 diabetes mellitus without complications: Secondary | ICD-10-CM | POA: Diagnosis not present

## 2023-12-25 LAB — POCT INR: INR: 3.5 — AB (ref 2.0–3.0)

## 2023-12-25 NOTE — Patient Instructions (Signed)
 Hold warfarin tonight then resume 2 tablets daily. S/P oral surgery on tongue 5/24. On 12oz of Glucerna 5 x day via PEG tube Started radiation 07/01/23. Recheck in 3 weeks

## 2023-12-30 DIAGNOSIS — H02831 Dermatochalasis of right upper eyelid: Secondary | ICD-10-CM | POA: Diagnosis not present

## 2023-12-30 DIAGNOSIS — H33321 Round hole, right eye: Secondary | ICD-10-CM | POA: Diagnosis not present

## 2023-12-30 DIAGNOSIS — H43821 Vitreomacular adhesion, right eye: Secondary | ICD-10-CM | POA: Diagnosis not present

## 2023-12-30 DIAGNOSIS — H2513 Age-related nuclear cataract, bilateral: Secondary | ICD-10-CM | POA: Diagnosis not present

## 2023-12-30 DIAGNOSIS — H0102A Squamous blepharitis right eye, upper and lower eyelids: Secondary | ICD-10-CM | POA: Diagnosis not present

## 2023-12-30 DIAGNOSIS — H0102B Squamous blepharitis left eye, upper and lower eyelids: Secondary | ICD-10-CM | POA: Diagnosis not present

## 2023-12-30 DIAGNOSIS — H02834 Dermatochalasis of left upper eyelid: Secondary | ICD-10-CM | POA: Diagnosis not present

## 2023-12-30 DIAGNOSIS — H40013 Open angle with borderline findings, low risk, bilateral: Secondary | ICD-10-CM | POA: Diagnosis not present

## 2023-12-30 DIAGNOSIS — H04123 Dry eye syndrome of bilateral lacrimal glands: Secondary | ICD-10-CM | POA: Diagnosis not present

## 2023-12-30 DIAGNOSIS — E119 Type 2 diabetes mellitus without complications: Secondary | ICD-10-CM | POA: Diagnosis not present

## 2024-01-14 ENCOUNTER — Ambulatory Visit: Payer: PPO | Attending: Cardiovascular Disease | Admitting: *Deleted

## 2024-01-14 DIAGNOSIS — I4891 Unspecified atrial fibrillation: Secondary | ICD-10-CM | POA: Diagnosis not present

## 2024-01-14 DIAGNOSIS — Z5181 Encounter for therapeutic drug level monitoring: Secondary | ICD-10-CM

## 2024-01-14 LAB — POCT INR: INR: 4.1 — AB (ref 2.0–3.0)

## 2024-01-14 NOTE — Patient Instructions (Signed)
Hold warfarin tonight then decrease dose to 2 tablets daily except 1 tablet on Mondays, Wednesdays and Fridays. S/P oral surgery on tongue 5/24. On 12oz of Glucerna 5 x day via PEG tube Started radiation 07/01/23. Recheck in 2 weeks

## 2024-01-19 DIAGNOSIS — C029 Malignant neoplasm of tongue, unspecified: Secondary | ICD-10-CM | POA: Diagnosis not present

## 2024-01-19 DIAGNOSIS — Z931 Gastrostomy status: Secondary | ICD-10-CM | POA: Diagnosis not present

## 2024-01-19 DIAGNOSIS — R1312 Dysphagia, oropharyngeal phase: Secondary | ICD-10-CM | POA: Diagnosis not present

## 2024-01-20 DIAGNOSIS — E1169 Type 2 diabetes mellitus with other specified complication: Secondary | ICD-10-CM | POA: Diagnosis not present

## 2024-01-21 DIAGNOSIS — E1169 Type 2 diabetes mellitus with other specified complication: Secondary | ICD-10-CM | POA: Diagnosis not present

## 2024-01-21 DIAGNOSIS — I10 Essential (primary) hypertension: Secondary | ICD-10-CM | POA: Diagnosis not present

## 2024-01-22 DIAGNOSIS — C020 Malignant neoplasm of dorsal surface of tongue: Secondary | ICD-10-CM | POA: Diagnosis not present

## 2024-01-22 DIAGNOSIS — Z930 Tracheostomy status: Secondary | ICD-10-CM | POA: Diagnosis not present

## 2024-01-22 DIAGNOSIS — Z9049 Acquired absence of other specified parts of digestive tract: Secondary | ICD-10-CM | POA: Diagnosis not present

## 2024-01-27 ENCOUNTER — Ambulatory Visit: Payer: PPO | Attending: Cardiovascular Disease | Admitting: *Deleted

## 2024-01-27 DIAGNOSIS — Z5181 Encounter for therapeutic drug level monitoring: Secondary | ICD-10-CM | POA: Diagnosis not present

## 2024-01-27 DIAGNOSIS — I4891 Unspecified atrial fibrillation: Secondary | ICD-10-CM | POA: Diagnosis not present

## 2024-01-27 DIAGNOSIS — E1159 Type 2 diabetes mellitus with other circulatory complications: Secondary | ICD-10-CM | POA: Diagnosis not present

## 2024-01-27 DIAGNOSIS — E1169 Type 2 diabetes mellitus with other specified complication: Secondary | ICD-10-CM | POA: Diagnosis not present

## 2024-01-27 DIAGNOSIS — I2510 Atherosclerotic heart disease of native coronary artery without angina pectoris: Secondary | ICD-10-CM | POA: Diagnosis not present

## 2024-01-27 DIAGNOSIS — I10 Essential (primary) hypertension: Secondary | ICD-10-CM | POA: Diagnosis not present

## 2024-01-27 DIAGNOSIS — M109 Gout, unspecified: Secondary | ICD-10-CM | POA: Diagnosis not present

## 2024-01-27 DIAGNOSIS — R600 Localized edema: Secondary | ICD-10-CM | POA: Diagnosis not present

## 2024-01-27 DIAGNOSIS — Z860100 Personal history of colon polyps, unspecified: Secondary | ICD-10-CM | POA: Diagnosis not present

## 2024-01-27 DIAGNOSIS — E782 Mixed hyperlipidemia: Secondary | ICD-10-CM | POA: Diagnosis not present

## 2024-01-27 DIAGNOSIS — R911 Solitary pulmonary nodule: Secondary | ICD-10-CM | POA: Diagnosis not present

## 2024-01-27 DIAGNOSIS — D6869 Other thrombophilia: Secondary | ICD-10-CM | POA: Diagnosis not present

## 2024-01-27 DIAGNOSIS — Z9889 Other specified postprocedural states: Secondary | ICD-10-CM | POA: Diagnosis not present

## 2024-01-27 DIAGNOSIS — Z23 Encounter for immunization: Secondary | ICD-10-CM | POA: Diagnosis not present

## 2024-01-27 LAB — POCT INR: POC INR: 2.3

## 2024-01-27 NOTE — Patient Instructions (Signed)
 Description   Continue to take warfarin  2 tablets daily except 1 tablet on Mondays, Wednesdays and Fridays. S/P oral surgery on tongue 5/24. On 12oz of Glucerna 5 x day via PEG tube Started radiation 07/01/23. Recheck in 3 weeks

## 2024-02-02 DIAGNOSIS — E119 Type 2 diabetes mellitus without complications: Secondary | ICD-10-CM | POA: Diagnosis not present

## 2024-02-02 DIAGNOSIS — C020 Malignant neoplasm of dorsal surface of tongue: Secondary | ICD-10-CM | POA: Diagnosis not present

## 2024-02-02 DIAGNOSIS — Z9049 Acquired absence of other specified parts of digestive tract: Secondary | ICD-10-CM | POA: Diagnosis not present

## 2024-02-07 ENCOUNTER — Other Ambulatory Visit: Payer: Self-pay

## 2024-02-07 ENCOUNTER — Emergency Department (HOSPITAL_COMMUNITY)
Admission: EM | Admit: 2024-02-07 | Discharge: 2024-02-07 | Disposition: A | Discharge status: Home / Self Care | Payer: PPO | Attending: Emergency Medicine | Admitting: Emergency Medicine

## 2024-02-07 ENCOUNTER — Encounter (HOSPITAL_COMMUNITY): Payer: Self-pay | Admitting: *Deleted

## 2024-02-07 DIAGNOSIS — Z91040 Latex allergy status: Secondary | ICD-10-CM | POA: Diagnosis not present

## 2024-02-07 DIAGNOSIS — Z79899 Other long term (current) drug therapy: Secondary | ICD-10-CM | POA: Insufficient documentation

## 2024-02-07 DIAGNOSIS — R21 Rash and other nonspecific skin eruption: Secondary | ICD-10-CM | POA: Diagnosis not present

## 2024-02-07 DIAGNOSIS — K9423 Gastrostomy malfunction: Secondary | ICD-10-CM | POA: Diagnosis not present

## 2024-02-07 DIAGNOSIS — Z85810 Personal history of malignant neoplasm of tongue: Secondary | ICD-10-CM | POA: Insufficient documentation

## 2024-02-07 DIAGNOSIS — E119 Type 2 diabetes mellitus without complications: Secondary | ICD-10-CM | POA: Diagnosis not present

## 2024-02-07 DIAGNOSIS — I2510 Atherosclerotic heart disease of native coronary artery without angina pectoris: Secondary | ICD-10-CM | POA: Diagnosis not present

## 2024-02-07 DIAGNOSIS — Z7984 Long term (current) use of oral hypoglycemic drugs: Secondary | ICD-10-CM | POA: Insufficient documentation

## 2024-02-07 DIAGNOSIS — I10 Essential (primary) hypertension: Secondary | ICD-10-CM | POA: Diagnosis not present

## 2024-02-07 LAB — URINALYSIS, ROUTINE W REFLEX MICROSCOPIC
Bilirubin Urine: NEGATIVE
Glucose, UA: NEGATIVE mg/dL
Hgb urine dipstick: NEGATIVE
Ketones, ur: NEGATIVE mg/dL
Leukocytes,Ua: NEGATIVE
Nitrite: NEGATIVE
Protein, ur: NEGATIVE mg/dL
Specific Gravity, Urine: 1.012 (ref 1.005–1.030)
pH: 5 (ref 5.0–8.0)

## 2024-02-07 LAB — COMPREHENSIVE METABOLIC PANEL
ALT: 19 U/L (ref 0–44)
AST: 23 U/L (ref 15–41)
Albumin: 4.6 g/dL (ref 3.5–5.0)
Alkaline Phosphatase: 45 U/L (ref 38–126)
Anion gap: 13 (ref 5–15)
BUN: 37 mg/dL — ABNORMAL HIGH (ref 8–23)
CO2: 25 mmol/L (ref 22–32)
Calcium: 9.9 mg/dL (ref 8.9–10.3)
Chloride: 101 mmol/L (ref 98–111)
Creatinine, Ser: 0.76 mg/dL (ref 0.61–1.24)
GFR, Estimated: >60 mL/min (ref >60)
Glucose, Bld: 97 mg/dL (ref 70–99)
Potassium: 4.3 mmol/L (ref 3.5–5.1)
Sodium: 139 mmol/L (ref 135–145)
Total Bilirubin: 1 mg/dL (ref 0.0–1.2)
Total Protein: 7.6 g/dL (ref 6.5–8.1)

## 2024-02-07 LAB — CBC WITH DIFFERENTIAL/PLATELET
Abs Immature Granulocytes: 0.01 10*3/uL (ref 0.00–0.07)
Basophils Absolute: 0.1 10*3/uL (ref 0.0–0.1)
Basophils Relative: 1 %
Eosinophils Absolute: 0.8 10*3/uL — ABNORMAL HIGH (ref 0.0–0.5)
Eosinophils Relative: 14 %
HCT: 46.8 % (ref 39.0–52.0)
Hemoglobin: 15.6 g/dL (ref 13.0–17.0)
Immature Granulocytes: 0 %
Lymphocytes Relative: 13 %
Lymphs Abs: 0.7 10*3/uL (ref 0.7–4.0)
MCH: 30.5 pg (ref 26.0–34.0)
MCHC: 33.3 g/dL (ref 30.0–36.0)
MCV: 91.6 fL (ref 80.0–100.0)
Monocytes Absolute: 0.4 10*3/uL (ref 0.1–1.0)
Monocytes Relative: 7 %
Neutro Abs: 3.7 10*3/uL (ref 1.7–7.7)
Neutrophils Relative %: 65 %
Platelets: 117 10*3/uL — ABNORMAL LOW (ref 150–400)
RBC: 5.11 MIL/uL (ref 4.22–5.81)
RDW: 13.9 % (ref 11.5–15.5)
WBC: 5.7 10*3/uL (ref 4.0–10.5)
nRBC: 0 % (ref 0.0–0.2)

## 2024-02-07 LAB — LIPASE, BLOOD: Lipase: 75 U/L — ABNORMAL HIGH (ref 11–51)

## 2024-02-07 MED ORDER — HYDROCORTISONE 1 % EX CREA: TOPICAL_CREAM | CUTANEOUS | 0 refills | Status: AC

## 2024-02-07 NOTE — ED Triage Notes (Signed)
 BIB family from home for "feeding tube leaking", onset this am around 0900, appeared normal upon waking this am. Endorses TTP and redness around tube. Denies abd pain, back/ flank pain,. NVD, fever or other sx. Placed last May 2024 at [facility]. D/c'd fropm [place] and sees [place], but hasn't seen them in 3 yrs. Tube last used this am.

## 2024-02-07 NOTE — ED Provider Notes (Signed)
 [place] EMERGENCY DEPARTMENT AT [facility] Provider Note   CSN: [id number] Arrival date & time: 02/07/24  1251     History  Chief Complaint  Patient presents with   feeding tube leaking    Jacob [name] is a 73 y.o. male.  HPI   73 year old male presents emergency department with complaints of feeding tube issue.  Patient reports has chronic G-tube placement secondary to tongue cancer.  Reports having redness around the site of his G-tube for the past couple of days.  Reports noting drainage from the G-tube site from the unclosed port on the front that began yesterday.  States that he usually keeps it open.  Denies any drainage from around the tube itself.  States he is also been having some skin reaction to the tape he has been using.  States the area is itchy but does not necessarily hurt to press on.  Denies any fever, chills, nausea, vomit, urinary symptoms, change in bowel habits.  Past medical history significant for hypertension, hyperlipidemia, medic valve disease, diabetes type 2, atrial fibrillation, CAD, dyslipidemia, tongue cancer  Home Medications Prior to Admission medications   Medication Sig Start Date End Date Taking? Authorizing Provider  hydrocortisone cream 1 % Apply to affected area 2 times daily.  Placed over areas of redness where the bandages were but not directly over where the tube inserts. 02/07/24  Yes Sherian Maroon A, PA  acetaminophen (TYLENOL) 500 MG tablet Take 1,000 mg by mouth 2 (two) times daily as needed for mild pain or headache.    [provider]  allopurinol (ZYLOPRIM) 300 MG tablet TAKE 1 TABLET BY MOUTH ONCE A DAY. 01/08/21   Croitoru, Mihai, MD  cholecalciferol (VITAMIN D3) 25 MCG (1000 UNIT) tablet Take 1,000 Units by mouth daily. Patient not taking: Reported on 11/10/2023    [provider]  gabapentin (NEURONTIN) 100 MG capsule Take 100 mg by mouth 3 (three) times daily. 04/22/23 04/21/24  [provider]  lidocaine (XYLOCAINE) 2 % solution Patient: Mix 1part 2% viscous lidocaine, 1part H20. Swish & swallow 10mL of diluted mixture, before meals and at bedtime, up to QID Patient not taking: Reported on 11/10/2023 07/20/23   Lonie Peak, MD  metFORMIN (GLUCOPHAGE) 1000 MG tablet Take 1,000 mg by mouth daily with breakfast. 08/06/22   [provider]  nystatin (MYCOSTATIN) 100000 UNIT/ML suspension Take 5 mLs (500,000 Units total) by mouth 4 (four) times daily. Swish for 1 minute and then swallow. Continue for 12 days. Patient not taking: Reported on 11/10/2023 07/13/23   Lonie Peak, MD  omeprazole (PRILOSEC) 20 MG capsule Take 20 mg by mouth daily. 05/23/23 06/22/23  [provider]  pioglitazone (ACTOS) 15 MG tablet Take 15 mg by mouth daily.    [provider]  rosuvastatin (CRESTOR) 20 MG tablet Take 20 mg by mouth daily. 06/23/22   [provider]  sitaGLIPtin (JANUVIA) 100 MG tablet Take 100 mg by mouth daily.    [provider]  SODIUM FLUORIDE 5000 PPM 1.1 % PSTE Take 1 Application by mouth at bedtime. 06/18/23   [provider]  torsemide (DEMADEX) 20 MG tablet Take 1-2 tablets (20-40 mg total) by mouth daily. Patient taking differently: Take 20 mg by mouth daily. 06/22/20   Abelino Derrick, PA-C  traMADol (ULTRAM) 50 MG tablet Take 1 tablet (50 mg total) by mouth every 6 (six) hours as needed. Patient not taking: Reported on 11/10/2023 04/13/23   Jeanie Sewer  T IV, MD  warfarin (COUMADIN) 5 MG tablet TAKE 2 TABLETS BY MOUTH ONCE DAILY OR AS DIRECTED 08/06/23   Croitoru, Mihai, MD      Allergies    Ace inhibitors, Atenolol, Cardizem [diltiazem hcl], Latex, Melatonin, Metoprolol, and Clindamycin/lincomycin    Review of Systems   Review of Systems  All other systems reviewed and are negative.   Physical Exam Updated Vital Signs BP 128/82 (BP Location: Left Arm)   Pulse 81   Temp (!) 96.4 F (35.8 C) (Axillary)   Resp  16   Wt 70.3 kg   SpO2 100%   BMI 20.45 kg/m  Physical Exam Vitals and nursing note reviewed.  Constitutional:      General: He is not in acute distress.    Appearance: He is well-developed.  HENT:     Head: Normocephalic and atraumatic.  Eyes:     Conjunctiva/sclera: Conjunctivae normal.  Cardiovascular:     Rate and Rhythm: Normal rate and regular rhythm.     Heart sounds: No murmur heard. Pulmonary:     Effort: Pulmonary effort is normal. No respiratory distress.     Breath sounds: Normal breath sounds. No wheezing, rhonchi or rales.  Abdominal:     Palpations: Abdomen is soft.     Tenderness: There is no abdominal tenderness.     Comments: No real tenderness to abdominal palpation.  G-tube present with scant appearance as below.  Area not tender to the touch or warm to palpation.  Patient describes area as pruritic.  Musculoskeletal:        General: No swelling.     Cervical back: Neck supple.  Skin:    General: Skin is warm and dry.     Capillary Refill: Capillary refill takes less than 2 seconds.  Neurological:     Mental Status: He is alert.  Psychiatric:        Mood and Affect: Mood normal.     ED Results / Procedures / Treatments   Labs (all labs ordered are listed, but only abnormal results are displayed) Labs Reviewed  LIPASE, BLOOD - Abnormal; Notable for the following components:      Result Value   Lipase 75 (*)    All other components within normal limits  COMPREHENSIVE METABOLIC PANEL - Abnormal; Notable for the following components:   BUN 37 (*)    All other components within normal limits  CBC WITH DIFFERENTIAL/PLATELET - Abnormal; Notable for the following components:   Platelets 117 (*)    Eosinophils Absolute 0.8 (*)    All other components within normal limits  URINALYSIS, ROUTINE W REFLEX MICROSCOPIC    EKG None  Radiology No results found.  Procedures Procedures    Medications Ordered in ED Medications - No data to  display  ED Course/ Medical Decision Making/ A&P                                 Medical Decision Making Amount and/or Complexity of Data Reviewed Labs: ordered.   This patient presents to the ED for concern of rash, this involves an extensive number of treatment options, and is a complaint that carries with it a high risk of complications and morbidity.  The differential diagnosis includes lightest, erysipelas, necrotizing infection, G-tube malfunction, other   Co morbidities that complicate the patient evaluation  See HPI   Additional history obtained:  Additional history obtained from EMR External  records from outside source obtained and reviewed including hospital records   Lab Tests:  Triage staff ordered, and personally interpreted labs.  The pertinent results include: No leukocytosis.  No evidence of anemia.  Thrombocytopenia of 117.  No Electra abnormalities.  No transaminitis.  No renal dysfunction.  Lipase of 75.  UA without abnormality.   Imaging Studies ordered:  N/a   Cardiac Monitoring: / EKG:  The patient was maintained on a cardiac monitor.  I personally viewed and interpreted the cardiac monitored which showed an underlying rhythm of: sinus rhythm   Consultations Obtained:  N/a   Problem List / ED Course / Critical interventions / Medication management  Rash, G-tube malfunction Reevaluation of the patient after these medicines showed that the patient stayed the same I have reviewed the patients home medicines and have made adjustments as needed   Social Determinants of Health:  Former cigarette use.  Denies illicit drug use.   Test / Admission - Considered:  Rash, G-tube malfunction Vitals signs within normal range and stable throughout visit. Laboratory studies significant for: See above 73 year old male presents emergency department claims of rash as well as GT malfunction.  Has been having rash for the past several days and seems to be  having allergic reaction to the tape that he has been using at home to secure bandages.  On exam, erythematous rash appreciated on G-tube with satellite lesions.  Not clinically evident of cellulitis/erysipelas.  No palpable warmth or tenderness.  Area seems to be more consistent of contact of otitis from tissue adhesive with some skin breakdown from pulling of leaking liquid.  Will recommend local wound care at home around site as well as topical corticosteroids over area of rash from adhesive bandage.  Regarding reported G-tube malfunction, it has not been leaking around the site of the G-tube but from open port the patient has had open for the past couple of days.  Active leaking appreciated on initial exam but when area was closed, no leaking present.  Patient recently flushed the tube this morning and administered medication/food.  Lower suspicion that the G-tube is actually malfunctioning but seems to be more user error.  Will recommend continued maintenance at home and follow-up with surgery in the outpatient setting.  Treatment plan discussed at length with patient and he acknowledged understanding was agreeable to said plan.  Patient overall well-appearing, afebrile in no acute distress. Worrisome signs and symptoms were discussed with the patient, and the patient acknowledged understanding to return to the ED if noticed. Patient was stable upon discharge.          Final Clinical Impression(s) / ED Diagnoses Final diagnoses:  Gastrostomy mechanical complication (HCC)  Rash    Rx / DC Orders ED Discharge Orders          Ordered    hydrocortisone cream 1 %        02/07/24 1429              Peter Garter, [place] 02/07/24 1504    Vanetta Mulders, MD 02/10/24 1102

## 2024-02-07 NOTE — Discharge Instructions (Addendum)
 As discussed, will send in cream to place over area of Story having a skin reaction from the adhesive.  Recommend continued placing Aquaphor around the G-tube site as well as daily bandage changes.  As we discussed, please place the plug back over the G-tube whenever you are not using it to avoid any leakage.  Recommend follow-up with your surgeon in the outpatient setting for reassessment.  Please do not hesitate to return if the worrisome signs and symptoms we discussed become apparent.

## 2024-02-17 ENCOUNTER — Ambulatory Visit: Payer: PPO | Attending: Cardiovascular Disease | Admitting: *Deleted

## 2024-02-17 DIAGNOSIS — Z5181 Encounter for therapeutic drug level monitoring: Secondary | ICD-10-CM | POA: Diagnosis not present

## 2024-02-17 DIAGNOSIS — I4891 Unspecified atrial fibrillation: Secondary | ICD-10-CM

## 2024-02-17 LAB — POCT INR: INR: 2.3 (ref 2.0–3.0)

## 2024-02-17 NOTE — Patient Instructions (Signed)
 Continue to take warfarin  2 tablets daily except 1 tablet on Mondays, Wednesdays and Fridays. S/P oral surgery on tongue 5/24. On 12oz of Glucerna 5 x day via PEG tube Started radiation 07/01/23. Recheck in 4 weeks

## 2024-02-19 DIAGNOSIS — Z9049 Acquired absence of other specified parts of digestive tract: Secondary | ICD-10-CM | POA: Diagnosis not present

## 2024-02-19 DIAGNOSIS — C020 Malignant neoplasm of dorsal surface of tongue: Secondary | ICD-10-CM | POA: Diagnosis not present

## 2024-02-19 DIAGNOSIS — Z930 Tracheostomy status: Secondary | ICD-10-CM | POA: Diagnosis not present

## 2024-02-22 ENCOUNTER — Ambulatory Visit
Admission: EM | Admit: 2024-02-22 | Discharge: 2024-02-22 | Disposition: A | Discharge status: Home / Self Care | Attending: Family Medicine | Admitting: Family Medicine

## 2024-02-22 DIAGNOSIS — Z203 Contact with and (suspected) exposure to rabies: Secondary | ICD-10-CM | POA: Diagnosis not present

## 2024-02-22 DIAGNOSIS — Z23 Encounter for immunization: Secondary | ICD-10-CM

## 2024-02-22 DIAGNOSIS — S41132A Puncture wound without foreign body of left upper arm, initial encounter: Secondary | ICD-10-CM | POA: Diagnosis not present

## 2024-02-22 DIAGNOSIS — M79602 Pain in left arm: Secondary | ICD-10-CM | POA: Diagnosis not present

## 2024-02-22 MED ORDER — CHLORHEXIDINE GLUCONATE 4 % EX SOLN: Freq: Every day | CUTANEOUS | 0 refills | Status: AC | PRN

## 2024-02-22 MED ORDER — TETANUS-DIPHTH-ACELL PERTUSSIS 5-2.5-18.5 LF-MCG/0.5 IM SUSY
0.5000 mL | PREFILLED_SYRINGE | Freq: Once | INTRAMUSCULAR | Status: AC
  Administered 2024-02-22: 0.5 mL via INTRAMUSCULAR

## 2024-02-22 NOTE — Discharge Instructions (Signed)
 Clean the area at least once a day with Hibiclens and apply an antibiotic solution and a nonstick dressing.  We have updated your tetanus shot today.  Follow-up for significant redness, swelling, thick drainage, fevers

## 2024-02-22 NOTE — ED Triage Notes (Signed)
 Pt reports a cut the left arm after accidentally cutting himself while trying to cut caps off a cord. X 1 day.

## 2024-02-24 ENCOUNTER — Other Ambulatory Visit: Payer: Self-pay | Admitting: Cardiovascular Disease

## 2024-02-24 NOTE — Telephone Encounter (Signed)
 Refill request for warfarin:  Last INR was 2.3 on 02/17/24 Next INR due 03/16/24 LOV was 08/19/22  Past due for F/U with Dr Royann Shivers.  Message sent to schedulers. Refill approved.

## 2024-02-26 NOTE — ED Provider Notes (Signed)
 [facility]    CSN: [id number] Arrival date & time: 02/22/24  1001      History   Chief Complaint No chief complaint on file.   HPI Jacob [name] is a 73 y.o. male.   Patient presenting today with 1 day history of a puncture/laceration to the left arm when a cord punctured his arm.  Denies uncontrolled bleeding, concern for foreign body retention, numbness, tingling, decreased range of motion.  Has cleaned the wound well, not tried anything over-the-counter for symptom otherwise.    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,severe MR   Other fatigue    Other specified soft tissue disorders    Rheumatic mitral valve disease, unspecified    S/P mitral valve replacement 12/11/2004   Edwards ring   Type 2 diabetes mellitus without complications (HCC)    Unspecified atrial fibrillation [facility])     Patient Active Problem List   Diagnosis Date Noted   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   DOE (dyspnea on exertion) 04/26/2020   Chest pain with moderate risk for cardiac etiology 04/26/2020   CAD (coronary artery disease) 04/26/2020   Chronic anticoagulation 03/13/2020   Essential (primary) hypertension    Hyperlipidemia, unspecified    Other fatigue    Other specified soft tissue disorders    Rheumatic mitral valve disease, unspecified    Unspecified atrial fibrillation (HCC)    Atrial fibrillation (HCC) 11/03/2019   Encounter for therapeutic drug monitoring 11/03/2019   Diabetes mellitus type 2 in nonobese (HCC) 01/10/2016   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 one vessel disease,severe MR   CARDIAC CATHETERIZATION  01/19/2006   normal   CARDIAC CATHETERIZATION  06/06/2010   mild nonobstructive CAD   COLONOSCOPY WITH PROPOFOL N/A 09/23/2021   Procedure: COLONOSCOPY WITH PROPOFOL;  Surgeon: Lanelle Bal, DO;  Location: [facility];  Service: Endoscopy;  Laterality: N/A;  9:15am   cyst  1980   removed from wrist   ESOPHAGOGASTRODUODENOSCOPY (EGD) WITH PROPOFOL N/A 09/23/2021   Procedure: ESOPHAGOGASTRODUODENOSCOPY (EGD) WITH PROPOFOL;  Surgeon: Lanelle Bal, DO;  [facility];  Service: Endoscopy;  Laterality: N/A;   IR PATIENT EVAL TECH 0-60 MINS  08/11/2023   MITRAL VALVE SURGERY  12/11/2004   MV repair with Randa Evens ring,closure of PFO   POLYPECTOMY  09/23/2021   Procedure: POLYPECTOMY;  Surgeon: Lanelle Bal, DO;  [facility];  Service: Endoscopy;;   RADIOFREQUENCY ABLATION  12/02/2004   Dr. Monna Fam   SALIVARY GLAND SURGERY  2004   growth   tongue cancer  05/15/2023   TONSILLECTOMY  1959       Home Medications    Prior to Admission medications   Medication Sig Start Date End Date Taking? Authorizing Provider  chlorhexidine (HIBICLENS) 4 %  external liquid Apply topically daily as needed. 02/22/24  Yes Particia Nearing, PA-C  acetaminophen (TYLENOL) 500 MG tablet Take 1,000 mg by mouth 2 (two) times daily as needed for mild pain or headache.    [provider]  allopurinol (ZYLOPRIM) 300 MG tablet TAKE 1 TABLET BY MOUTH ONCE A DAY. 01/08/21   Croitoru, Mihai, MD  cholecalciferol (VITAMIN D3) 25 MCG (1000 UNIT) tablet Take 1,000 Units by mouth daily. Patient not taking: Reported on 11/10/2023    [provider]  gabapentin (NEURONTIN) 100 MG  capsule Take 100 mg by mouth 3 (three) times daily. 04/22/23 04/21/24  [provider]  hydrocortisone cream 1 % Apply to affected area 2 times daily.  Placed over areas of redness where the bandages were but not directly over where the tube inserts. 02/07/24   Sherian Maroon A, PA  lidocaine (XYLOCAINE) 2 % solution Patient: Mix 1part 2% viscous lidocaine, 1part H20. Swish & swallow 10mL of diluted mixture, before meals and at bedtime, up to QID Patient not taking: Reported on 11/10/2023 07/20/23   Lonie Peak, MD  metFORMIN (GLUCOPHAGE) 1000 MG tablet Take 1,000 mg by mouth daily with breakfast. 08/06/22   [provider]  nystatin (MYCOSTATIN) 100000 UNIT/ML suspension Take 5 mLs (500,000 Units total) by mouth 4 (four) times daily. Swish for 1 minute and then swallow. Continue for 12 days. Patient not taking: Reported on 11/10/2023 07/13/23   Lonie Peak, MD  omeprazole (PRILOSEC) 20 MG capsule Take 20 mg by mouth daily. 05/23/23 06/22/23  [provider]  pioglitazone (ACTOS) 15 MG tablet Take 15 mg by mouth daily.    [provider]  rosuvastatin (CRESTOR) 20 MG tablet Take 20 mg by mouth daily. 06/23/22   [provider]  sitaGLIPtin (JANUVIA) 100 MG tablet Take 100 mg by mouth daily.    [provider]  SODIUM FLUORIDE 5000 PPM 1.1 % PSTE Take 1 Application by mouth at bedtime. 06/18/23   [provider]  torsemide (DEMADEX) 20 MG tablet Take 1-2 tablets (20-40 mg total) by mouth daily. Patient taking differently: Take 20 mg by mouth daily. 06/22/20   Abelino Derrick, PA-C  traMADol (ULTRAM) 50 MG tablet Take 1 tablet (50 mg total) by mouth every 6 (six) hours as needed. Patient not taking: Reported on 11/10/2023 04/13/23   Jaci Standard, MD  warfarin (COUMADIN) 5 MG tablet TAKE 1-2 TABLETS BY MOUTH ONCE DAILY OR AS DIRECTED 02/24/24   Croitoru, Rachelle Hora, MD    Family History Family History  Problem Relation Age of Onset   Cancer  Mother        non-hodgkins lymphoma   Glaucoma Mother    Cancer Maternal Grandmother     Social History Social History   Tobacco Use   Smoking status: Former    Current packs/day: 0.00    Types: Cigarettes    Start date: 1970    Quit date: 1985    Years since quitting: 40.2   Smokeless tobacco: Never  Vaping Use   Vaping status: Never Used  Substance Use Topics   Alcohol use: No   Drug use: No     Allergies   Ace inhibitors, Atenolol, Cardizem [diltiazem hcl], Latex, Melatonin, Metoprolol, and Clindamycin/lincomycin   Review of Systems Review of Systems Per HPI  Physical Exam Triage Vital Signs ED Triage Vitals  Encounter Vitals Group     BP 02/22/24 1029 114/74     Systolic BP Percentile --  Diastolic BP Percentile --      Pulse Rate 02/22/24 1029 90     Resp 02/22/24 1029 18     Temp 02/22/24 1029 98.7 F (37.1 C)     Temp Source 02/22/24 1029 Oral     SpO2 02/22/24 1029 98 %     Weight --      Height --      Head Circumference --      Peak Flow --      Pain Score 02/22/24 1032 0     Pain Loc --      Pain Education --      Exclude from Growth Chart --    No data found.  Updated Vital Signs BP 114/74 (BP Location: Right Arm)   Pulse 90   Temp 98.7 F (37.1 C) (Oral)   Resp 18   SpO2 98%   Visual Acuity Right Eye Distance:   Left Eye Distance:   Bilateral Distance:    Right Eye Near:   Left Eye Near:    Bilateral Near:     Physical Exam Vitals and nursing note reviewed.  Constitutional:      Appearance: Normal appearance.  HENT:     Head: Atraumatic.  Eyes:     Extraocular Movements: Extraocular movements intact.     Conjunctiva/sclera: Conjunctivae normal.  Cardiovascular:     Rate and Rhythm: Normal rate and regular rhythm.  Pulmonary:     Effort: Pulmonary effort is normal.     Breath sounds: Normal breath sounds.  Musculoskeletal:        General: Normal range of motion.     Cervical back: Normal range of motion and neck  supple.  Skin:    General: Skin is warm.     Comments: Small 0.5 cm open area to the left upper extremity, no foreign body appreciable, wound edges well-approximated.  Bleeding well-controlled  Neurological:     General: No focal deficit present.     Mental Status: He is oriented to person, place, and time.     Motor: No weakness.     Gait: Gait normal.     Comments: Left upper extremity neurovascularly intact.  Psychiatric:        Mood and Affect: Mood normal.        Thought Content: Thought content normal.        Judgment: Judgment normal.      UC Treatments / Results  Labs (all labs ordered are listed, but only abnormal results are displayed) Labs Reviewed - No data to display  EKG   Radiology No results found.  Procedures Procedures (including critical care time)  Medications Ordered in UC Medications  Tdap (BOOSTRIX) injection 0.5 mL (0.5 mLs Intramuscular Given 02/22/24 1121)    Initial Impression / Assessment and Plan / UC Course  I have reviewed the triage vital signs and the nursing notes.  Pertinent labs & imaging results that were available during my care of the patient were reviewed by me and considered in my medical decision making (see chart for details).     Tdap updated, wound cleaned and dressed today.  Will treat with Hibiclens, mupirocin, and keep it dressed until fully improved.  Supportive home care and return precautions reviewed.  Final Clinical Impressions(s) / UC Diagnoses   Final diagnoses:  Left arm pain  Puncture wound of left upper arm, initial encounter  Need for Tdap vaccination     Discharge Instructions      Clean  the area at least once a day with Hibiclens and apply an antibiotic solution and a nonstick dressing.  We have updated your tetanus shot today.  Follow-up for significant redness, swelling, thick drainage, fevers    ED Prescriptions     Medication Sig Dispense Auth. Provider   chlorhexidine (HIBICLENS) 4 %  external liquid Apply topically daily as needed. 236 mL Particia Nearing, [place]      PDMP not reviewed this encounter.   Particia Nearing, [place] 02/26/24 [phone]

## 2024-03-01 DIAGNOSIS — Z9049 Acquired absence of other specified parts of digestive tract: Secondary | ICD-10-CM | POA: Diagnosis not present

## 2024-03-01 DIAGNOSIS — C020 Malignant neoplasm of dorsal surface of tongue: Secondary | ICD-10-CM | POA: Diagnosis not present

## 2024-03-01 DIAGNOSIS — E119 Type 2 diabetes mellitus without complications: Secondary | ICD-10-CM | POA: Diagnosis not present

## 2024-03-08 DIAGNOSIS — C029 Malignant neoplasm of tongue, unspecified: Secondary | ICD-10-CM | POA: Diagnosis not present

## 2024-03-08 DIAGNOSIS — K146 Glossodynia: Secondary | ICD-10-CM | POA: Diagnosis not present

## 2024-03-16 ENCOUNTER — Ambulatory Visit: Payer: PPO | Attending: Cardiovascular Disease | Admitting: *Deleted

## 2024-03-16 DIAGNOSIS — Z5181 Encounter for therapeutic drug level monitoring: Secondary | ICD-10-CM | POA: Diagnosis not present

## 2024-03-16 DIAGNOSIS — I4891 Unspecified atrial fibrillation: Secondary | ICD-10-CM | POA: Diagnosis not present

## 2024-03-16 LAB — POCT INR: INR: 2 (ref 2.0–3.0)

## 2024-03-16 NOTE — Patient Instructions (Signed)
 Continue to take warfarin  2 tablets daily except 1 tablet on Mondays, Wednesdays and Fridays. S/P oral surgery on tongue 5/24. On 12oz of Glucerna 5 x day via PEG tube Started radiation 07/01/23. Recheck in 4 weeks

## 2024-03-21 DIAGNOSIS — Z9049 Acquired absence of other specified parts of digestive tract: Secondary | ICD-10-CM | POA: Diagnosis not present

## 2024-03-21 DIAGNOSIS — Z930 Tracheostomy status: Secondary | ICD-10-CM | POA: Diagnosis not present

## 2024-03-21 DIAGNOSIS — C020 Malignant neoplasm of dorsal surface of tongue: Secondary | ICD-10-CM | POA: Diagnosis not present

## 2024-03-22 DIAGNOSIS — Z87891 Personal history of nicotine dependence: Secondary | ICD-10-CM | POA: Diagnosis not present

## 2024-03-22 DIAGNOSIS — Z923 Personal history of irradiation: Secondary | ICD-10-CM | POA: Diagnosis not present

## 2024-03-22 DIAGNOSIS — Z85810 Personal history of malignant neoplasm of tongue: Secondary | ICD-10-CM | POA: Diagnosis not present

## 2024-03-22 DIAGNOSIS — C029 Malignant neoplasm of tongue, unspecified: Secondary | ICD-10-CM | POA: Diagnosis not present

## 2024-03-22 DIAGNOSIS — Z79899 Other long term (current) drug therapy: Secondary | ICD-10-CM | POA: Diagnosis not present

## 2024-03-22 DIAGNOSIS — Z9009 Acquired absence of other part of head and neck: Secondary | ICD-10-CM | POA: Diagnosis not present

## 2024-03-22 DIAGNOSIS — K146 Glossodynia: Secondary | ICD-10-CM | POA: Diagnosis not present

## 2024-03-22 DIAGNOSIS — Z08 Encounter for follow-up examination after completed treatment for malignant neoplasm: Secondary | ICD-10-CM | POA: Diagnosis not present

## 2024-04-06 NOTE — Progress Notes (Signed)
 [name] presents to the clinic today for a follow up appointment. He completed radiation therapy for Malignant neoplasm of border of tongue on 08/11/2023.  Patient is doing well after treatment, is healing. Just feeling a little fatigue  Pain issues, if any: Patient denies any pain Using a feeding tube?: Patient has feeding tube and uses it two to three times. Patient does eat soups  Weight changes, if any: Patient has lost three pounds since he completed treatment Swallowing issues, if any: Yes, patient is having a difficult time getting foods down that are thick like ice cream and icing Smoking or chewing tobacco? None Using fluoride  toothpaste daily? Yes. Sodium flouride  twice a day Last ENT visit was on: April 1st, 2025 Other notable issues, if any:  None  BP 109/67 (BP Location: Left Arm, Patient Position: Sitting, Cuff Size: Normal)   Pulse 77   Temp 97.8 F (36.6 C)   Resp 18   Ht 6\' 1"  (1.854 m)   Wt 153 lb 9.6 oz (69.7 kg)   SpO2 100%   BMI 20.27 kg/m     Wt Readings from Last 3 Encounters:  04/13/24 153 lb 9.6 oz (69.7 kg)  02/07/24 155 lb (70.3 kg)  11/10/23 160 lb 2 oz (72.6 kg)

## 2024-04-13 ENCOUNTER — Ambulatory Visit: Attending: Cardiovascular Disease | Admitting: *Deleted

## 2024-04-13 ENCOUNTER — Other Ambulatory Visit: Payer: Self-pay

## 2024-04-13 ENCOUNTER — Ambulatory Visit
Admission: RE | Admit: 2024-04-13 | Discharge: 2024-04-13 | Disposition: A | Discharge status: Home / Self Care | Payer: Self-pay | Source: Ambulatory Visit | Attending: Radiation Oncology | Admitting: Radiation Oncology

## 2024-04-13 ENCOUNTER — Ambulatory Visit: Payer: PPO | Attending: Internal Medicine

## 2024-04-13 VITALS — BP 109/67 | HR 77 | Temp 97.8°F | Resp 18 | Ht 73.0 in | Wt 153.6 lb

## 2024-04-13 DIAGNOSIS — I4891 Unspecified atrial fibrillation: Secondary | ICD-10-CM | POA: Diagnosis not present

## 2024-04-13 DIAGNOSIS — C029 Malignant neoplasm of tongue, unspecified: Secondary | ICD-10-CM | POA: Diagnosis not present

## 2024-04-13 DIAGNOSIS — C021 Malignant neoplasm of border of tongue: Secondary | ICD-10-CM | POA: Insufficient documentation

## 2024-04-13 DIAGNOSIS — Z5181 Encounter for therapeutic drug level monitoring: Secondary | ICD-10-CM

## 2024-04-13 LAB — POCT INR: INR: 2 (ref 2.0–3.0)

## 2024-04-13 MED ORDER — SCOPOLAMINE 1 MG/3DAYS TD PT72: 1.0000 | MEDICATED_PATCH | TRANSDERMAL | 2 refills | Status: AC

## 2024-04-13 NOTE — Patient Instructions (Signed)
 Continue to take warfarin  2 tablets daily except 1 tablet on Mondays, Wednesdays and Fridays. S/P oral surgery on tongue 5/24. On 12oz of Glucerna 5 x day via PEG tube Started radiation 07/01/23. Recheck in 4 weeks

## 2024-04-13 NOTE — Progress Notes (Signed)
 Radiation Oncology         [phone] ________________________________  Name: Jacob [name] MRN: [id number]  Date: 04/13/2024  DOB: 1951/03/06  Follow-Up Visit Note  CC: Jacob Bickers, MD  Jacob Bickers, MD  Diagnosis and Prior Radiotherapy:    C02.1   ICD-10-CM   1. Squamous cell carcinoma of tongue (HCC)  C02.9 scopolamine  (TRANSDERM-SCOP) 1 MG/3DAYS    2. 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)  C02.1         Cancer Staging  Squamous cell carcinoma of tongue (HCC) Staging form: Oral Cavity, AJCC 8th Edition - Clinical stage from 04/17/2023: Stage IVA (cT2, cN2c, cM0) - Signed by Colie Dawes, MD on 04/17/2023 Stage prefix: Initial diagnosis   CHIEF COMPLAINT:  Here for follow-up and surveillance of tongue cancer  ==========DELIVERED PLANS==========  First Treatment Date: 2023-06-26 - Last Treatment Date: 2023-08-11   Plan Name: HN_Tongue Site: Tongue Technique: IMRT Mode: Photon Dose Per Fraction: 2 Gy Prescribed Dose (Delivered / Prescribed): 60 Gy / 60 Gy Prescribed Fxs (Delivered / Prescribed): 30 / 30  Narrative:  The patient returns today for routine follow-up.   Jacob [name] presents to the clinic today for a follow up appointment. He completed radiation therapy for Malignant neoplasm of border of tongue on 08/11/2023.  Patient is doing well after treatment, is healing. Just feeling a little fatigue  Pain issues, if any: Patient denies any pain Using a feeding tube?: Patient has feeding tube and uses it two to three times. Patient does eat soups  Weight changes, if any: Patient has lost three pounds since he completed treatment Swallowing issues, if any: Yes, patient is having a difficult time getting foods down that are thick like ice cream and icing Smoking or chewing tobacco? None Using fluoride  toothpaste daily? Yes. Sodium flouride  twice a day Last ENT visit was on: April 1st, 2025 Other notable issues, if any: The  patient, a survivor of tongue cancer, is several months post-treatment and reports overall good health but has concerns about chronic fatigue and a somewhat depressed mood. The patient's recent CT scan showed no evidence of disease. The patient has been working with a specialist to improve swallowing abilities but remains reliant on a feeding tube for nutrition. The patient has been consuming nutritional shakes and occasionally manages to consume tomato soup and Carnation Instant Breakfast. The patient reports issues with saliva production, which alternates between excessive and dry, impacting his ability to speak comfortably. The patient remains socially active, walking regularly, and maintaining contact with friends. However, the patient feels somewhat isolated due to difficulties with eating and speaking in social situations.  BP 109/67 (BP Location: Left Arm, Patient Position: Sitting, Cuff Size: Normal)   Pulse 77   Temp 97.8 F (36.6 C)   Resp 18   Ht 6\' 1"  (1.854 m)   Wt 153 lb 9.6 oz (69.7 kg)   SpO2 100%   BMI 20.27 kg/m     Wt Readings from Last 3 Encounters:  04/13/24 153 lb 9.6 oz (69.7 kg)  02/07/24 155 lb (70.3 kg)  11/10/23 160 lb 2 oz (72.6 kg)     CT neck this month at [facility] showed: FINDINGS:   Expected post-treatment changes are noted, including prior anterior hemiglossectomy, flap reconstruction, bilateral neck dissections, and radiation.   No evidence of recurrent disease is demonstrated at the primary site, however metallic artifact from dental amalgam partially obscures portions of the tongue.  No pathologically enlarged, necrotic, or otherwise abnormal lymph nodes are seen.   No lesions suspicious for metastatic involvement are identified in the imaged portions of the brain, orbits, spine, and lungs.   There are no findings to suggest a second primary malignancy in the imaged portions of the aerodigestive tract.   Atrophic right parotid gland. Mild apical  emphysema. Multilevel cervical spondylosis and associated cervical canal stenoses, moderate at C5-C6.    IMPRESSION:   Overall: Category 1 - no evidence of recurrence.   Primary: Category 1 - no evidence of recurrence.   Neck: Category 1 - no evidence of recurrence.    ALLERGIES:  is allergic to ace inhibitors, atenolol, cardizem [diltiazem hcl], latex, melatonin, metoprolol , and clindamycin/lincomycin.  Meds: Current Outpatient Medications  Medication Sig Dispense Refill   acetaminophen  (TYLENOL ) 500 MG tablet Take 1,000 mg by mouth 2 (two) times daily as needed for mild pain or headache.     allopurinol  (ZYLOPRIM ) 300 MG tablet TAKE 1 TABLET BY MOUTH ONCE A DAY. 90 tablet 0   gabapentin  (NEURONTIN ) 100 MG capsule Take 100 mg by mouth 3 (three) times daily.     hydrocortisone  cream 1 % Apply to affected area 2 times daily.  Placed over areas of redness where the bandages were but not directly over where the tube inserts. 15 g 0   pioglitazone  (ACTOS ) 15 MG tablet Take 15 mg by mouth daily.     rosuvastatin  (CRESTOR ) 20 MG tablet Take 20 mg by mouth daily.     scopolamine  (TRANSDERM-SCOP) 1 MG/3DAYS Place 1 patch (1.5 mg total) onto the skin every 3 (three) days. 10 patch 2   sitaGLIPtin-metformin  (JANUMET) 50-1000 MG tablet Take 1 tablet by mouth daily.     SODIUM FLUORIDE  5000 PPM 1.1 % PSTE Take 1 Application by mouth at bedtime.     torsemide  (DEMADEX ) 20 MG tablet Take 1-2 tablets (20-40 mg total) by mouth daily. (Patient taking differently: Take 20 mg by mouth daily.) 90 tablet 1   warfarin (COUMADIN ) 5 MG tablet TAKE 1-2 TABLETS BY MOUTH ONCE DAILY OR AS DIRECTED 60 tablet 5   chlorhexidine  (HIBICLENS ) 4 % external liquid Apply topically daily as needed. (Patient not taking: Reported on 04/13/2024) 236 mL 0   cholecalciferol (VITAMIN D3) 25 MCG (1000 UNIT) tablet Take 1,000 Units by mouth daily. (Patient not taking: Reported on 11/10/2023)     lidocaine  (XYLOCAINE ) 2 % solution  Patient: Mix 1part 2% viscous lidocaine , 1part H20. Swish & swallow 10mL of diluted mixture, before meals and at bedtime, up to QID (Patient not taking: Reported on 04/13/2024) 200 mL 3   metFORMIN  (GLUCOPHAGE ) 1000 MG tablet Take 1,000 mg by mouth daily with breakfast. (Patient not taking: Reported on 04/13/2024)     nystatin  (MYCOSTATIN ) 100000 UNIT/ML suspension Take 5 mLs (500,000 Units total) by mouth 4 (four) times daily. Swish for 1 minute and then swallow. Continue for 12 days. (Patient not taking: Reported on 04/13/2024) 240 mL 0   omeprazole (PRILOSEC) 20 MG capsule Take 20 mg by mouth daily.     sitaGLIPtin (JANUVIA) 100 MG tablet Take 100 mg by mouth daily. (Patient not taking: Reported on 04/13/2024)     traMADol  (ULTRAM ) 50 MG tablet Take 1 tablet (50 mg total) by mouth every 6 (six) hours as needed. (Patient not taking: Reported on 11/10/2023) 30 tablet 0   No current facility-administered medications for this encounter.    Physical Findings: The patient is in no acute distress.  Patient is alert and oriented. Wt Readings from Last 3 Encounters:  04/13/24 153 lb 9.6 oz (69.7 kg)  02/07/24 155 lb (70.3 kg)  11/10/23 160 lb 2 oz (72.6 kg)    height is 6\' 1"  (1.854 m) and weight is 153 lb 9.6 oz (69.7 kg). His temperature is 97.8 F (36.6 C). His blood pressure is 109/67 and his pulse is 77. His respiration is 18 and oxygen saturation is 100%. .  General: Alert and oriented, in no acute distress HEENT: Head is normocephalic. Extraocular movements are intact. Oropharynx is notable for tongue graft in place. Dryness in oral cavity. No concerning oral lesions.  Neck: Neck is notable for no palpable submental, cervical, or supraclavicular lymphadenopathy.  No edema. CHEST: Lungs clear to auscultation bilaterally. CARDIOVASCULAR: Heart regular rate and rhythm. ABDOMEN: Abdomen flat, non-distended Skin: Skin in treatment fields shows satisfactory healing in the radiation fields   Extremities: No cyanosis or edema. Lymphatics: see Neck Exam Psychiatric: Judgment and insight are intact. Affect is appropriate.   Lab Findings: Lab Results  Component Value Date   WBC 5.7 02/07/2024   HGB 15.6 02/07/2024   HCT 46.8 02/07/2024   MCV 91.6 02/07/2024   PLT 117 (L) 02/07/2024    Lab Results  Component Value Date   TSH 0.900 04/07/2023    Radiographic Findings: No results found.  Impression/Plan:    1) Head and Neck Cancer Status: NED per imaging and exam  2) Nutritional Status: Weight is slightly lower- continue to follow w SLP and nutrition by tube - push protein shakes as able Wt Readings from Last 3 Encounters:  04/13/24 153 lb 9.6 oz (69.7 kg)  02/07/24 155 lb (70.3 kg)  11/10/23 160 lb 2 oz (72.6 kg)   Using PEG  3) Risk Factors: The patient has been educated about risk factors including alcohol and tobacco abuse; they understand that avoidance of alcohol and tobacco is important to prevent recurrences as well as other cancers    4) Swallowing issues: Meeting with SLP - continue.  5) Dental: Encouraged to continue regular followup with dentistry, and dental hygiene including fluoride  supplements   6) Thyroid  function: [facility]SH WNL this month at [facility]   7) Depressed mood/fatigue - Arrange for a phone call from Dorice Gardner, the chaplain, for counseling support.   - Encourage social engagement to improve mood and energy levels.   - TSH WNL - No current antidepressant use.   - Discuss with primary doctor about the possibility of starting an antidepressant if mood does not improve.  8)Thick saliva   Alternates with dry mouth, affecting communication.  - Prescribe scopolamine  patch for up to three months to manage thick saliva. Wife will monitor for side effects and discontinue if he does not feel better.    9)Next visit:  f/u in 6 mo w/ CT neck and chest w/ contrast  On date of service, in total, I spent 60 minutes on this encounter. Patient was seen  in person.     Colie Dawes, MD

## 2024-04-14 ENCOUNTER — Other Ambulatory Visit: Payer: Self-pay

## 2024-04-14 ENCOUNTER — Telehealth: Payer: Self-pay

## 2024-04-14 DIAGNOSIS — C021 Malignant neoplasm of border of tongue: Secondary | ICD-10-CM

## 2024-04-14 DIAGNOSIS — C029 Malignant neoplasm of tongue, unspecified: Secondary | ICD-10-CM

## 2024-04-14 NOTE — Telephone Encounter (Signed)
 [facility] Clinical Social Work  Clinical Social Work was referred by medical provider for assessment of psychosocial needs.  Clinical Social Worker attempted to contact patient by phone to offer support and assess for needs. Unable to leave VM. Will attempt again.  Maudie Sorrow, LCSW  Clinical Social Worker [facility]

## 2024-04-15 ENCOUNTER — Telehealth: Payer: Self-pay

## 2024-04-15 NOTE — Telephone Encounter (Signed)
 [facility] Clinical Social Work  Clinical Social Work was referred by medical provider for assessment of psychosocial needs.  Clinical Social Worker contacted patient on phone. Patient expressed challenges with mood following diagnosis and treatment. Appointment scheduled for counseling   Maudie Sorrow, LCSW  Clinical Social Worker [facility]

## 2024-04-18 ENCOUNTER — Inpatient Hospital Stay: Attending: Genetic Counselor

## 2024-04-18 NOTE — Progress Notes (Signed)
 [facility] CSW Counseling Note  Patient was referred by medical provider. Treatment type: Individual  Presenting Concerns: Patient and/or family reports the following symptoms/concerns: Depression like symptoms (increased sadness,isolation, fatigue, irritability) post treatment.  Duration of problem: 8  months; Severity of problem: mild   Orientation:oriented to person, place, and time/date.   Affect: Congruent Risk of harm to self or others: No plan to harm self or others  Patient and/or Family's Strengths/Protective Factors: Social and Emotional competence, Concrete supports in place (healthy food, safe environments, etc.), Sense of purpose, and Physical Health (exercise, healthy diet, medication compliance, etc.)Ability for insight  Active sense of humor  Average or above average intelligence  Capable of independent living  Communication skills  Financial means  General fund of knowledge  Motivation for treatment/growth  Physical Health  Religious Affiliation  Supportive family/friends      Goals Addressed: Patient will:  Reduce symptoms of:   Depression like symptoms (increased sadness,isolation, fatigue, irritability) Increase knowledge and/or ability of: coping skills  Increase healthy adjustment to current life circumstances   Progress towards Goals: Initial   Interventions: Interventions utilized:  Other: Initial Assessment        Assessment: Patient currently experiencing depression like symptoms (increased sadness,isolation, fatigue, irritability) post completion of radiation treatment (approx eight months nearly everyday). Patient attributed reported symptoms to effects of diagnosis and treatment (feeding tube, dietary restrictions, levels of fatigue, and concerns about speech). The named effects of treatment have lead to increasing levels of isolation.   Patient reported positive social experience recently and expressed desire to continue social activities. CSW and  patient discussed fears of social interactions and strategies is social interactions do not go as planned.     Plan: Follow up with CSW: May 14th at 10:00 AM telephone  Behavioral recommendations: Continue strategies current implemented to assist with mood. Referral(s):  No referrals at this time        Maudie Sorrow, LCSW

## 2024-04-20 DIAGNOSIS — C020 Malignant neoplasm of dorsal surface of tongue: Secondary | ICD-10-CM | POA: Diagnosis not present

## 2024-04-20 DIAGNOSIS — Z930 Tracheostomy status: Secondary | ICD-10-CM | POA: Diagnosis not present

## 2024-04-20 DIAGNOSIS — Z9049 Acquired absence of other specified parts of digestive tract: Secondary | ICD-10-CM | POA: Diagnosis not present

## 2024-04-29 DIAGNOSIS — E441 Mild protein-calorie malnutrition: Secondary | ICD-10-CM | POA: Diagnosis not present

## 2024-04-29 DIAGNOSIS — Z713 Dietary counseling and surveillance: Secondary | ICD-10-CM | POA: Diagnosis not present

## 2024-04-29 DIAGNOSIS — Z681 Body mass index (BMI) 19 or less, adult: Secondary | ICD-10-CM | POA: Diagnosis not present

## 2024-04-29 DIAGNOSIS — C029 Malignant neoplasm of tongue, unspecified: Secondary | ICD-10-CM | POA: Diagnosis not present

## 2024-04-29 DIAGNOSIS — U071 COVID-19: Secondary | ICD-10-CM | POA: Diagnosis not present

## 2024-04-29 DIAGNOSIS — Z931 Gastrostomy status: Secondary | ICD-10-CM | POA: Diagnosis not present

## 2024-05-02 ENCOUNTER — Telehealth: Payer: Self-pay | Admitting: *Deleted

## 2024-05-02 NOTE — Telephone Encounter (Signed)
 CALLED PATIENT TO INFORM OF CT FOR 10-07-24- ARRIVAL TIME- 1 PM @ [facility] NO RESTRICTIONS TO SCAN, PATIENT TO RECEIVE RESULTS FROM PA ELLIE MUIR ON 10/18/24 @ 10:30 AM , VM FULL MAILED APPT. CARD

## 2024-05-05 NOTE — Therapy (Signed)
 [facility] [address] [place], [place], [address] Phone: [phone]   Fax:  [phone]  Patient Details  Name: Jacob [name] MRN: [id number] Date of Birth: 05/03/1951 Referring Provider:  Colie Dawes, MD  Encounter Date: 05/05/2024  SPEECH THERAPY DISCHARGE SUMMARY  Visits from Start of Care: 3  Current functional level related to goals / functional outcomes: Pt cancelled last two scheduled appointments and did not reschedule. Goals are below from his final attended session:   GOALS: Goals reviewed with patient? No   SHORT TERM GOALS: Target date: 10/26/23   Pt will perform HEP with independence x3 sessions Baseline: 09/22/23, 10/27/23 Goal status: Partially met   2.  Pt will demo safe swallow strategies from latest FEES independently Baseline:  Goal status: Met   3.  pt will tell SLP why pt is completing HEP with modified independence Baseline:  Goal status: Met   4.  pt will describe 3 overt s/s aspiration PNA with modified independence Baseline:  Goal status: Met   5.  pt will tell SLP how a food journal could hasten return to a more normalized diet Baseline:  Goal status: Met     LONG TERM GOALS: Target date: 11/24/23   Pt will demo safe swallow strategies from latest FEES x2 sessions (after 09/15/23) Baseline:  Goal status: INITIAL   2.   pt will describe how to modify HEP over time, and the timeline associated with reduction in HEP frequency with modified independence over two sessions Baseline:  Goal status: INITIAL   3  When asked, pt will repeat utterance and use speech strategies to improve articulation and bring functional intelligibility 100%, in 3 sessions Baseline: 10/27/23, Goal status: INITIAL   4. If clinically appropriate, pt may undergo follow up MBSS or FEES after 09/15/23. Baseline:  Goal status: INITIAL Remaining deficits: Unknown, as pt cancelled final two scheduled  appointments   Education / Equipment: See therapy notes for details.    Patient agrees to discharge. Patient goals were not met. Patient is being discharged due to not returning since the last visit.Aaron Aas    Krisandra Bueno, CCC-SLP 05/05/2024, 1:48 PM  [place] [facility] [address], [address] [place], [place], [address] Phone: [phone]   Fax:  [phone]

## 2024-05-11 ENCOUNTER — Ambulatory Visit: Attending: Cardiovascular Disease | Admitting: *Deleted

## 2024-05-11 DIAGNOSIS — Z5181 Encounter for therapeutic drug level monitoring: Secondary | ICD-10-CM

## 2024-05-11 DIAGNOSIS — I4891 Unspecified atrial fibrillation: Secondary | ICD-10-CM

## 2024-05-11 LAB — POCT INR: INR: 2.3 (ref 2.0–3.0)

## 2024-05-11 NOTE — Patient Instructions (Signed)
 Continue to take warfarin  2 tablets daily except 1 tablet on Mondays, Wednesdays and Fridays. S/P oral surgery on tongue 5/24. On 12oz of Glucerna 5 x day via PEG tube Started radiation 07/01/23. Recheck in 6 weeks

## 2024-05-17 DIAGNOSIS — C020 Malignant neoplasm of dorsal surface of tongue: Secondary | ICD-10-CM | POA: Diagnosis not present

## 2024-05-17 DIAGNOSIS — E119 Type 2 diabetes mellitus without complications: Secondary | ICD-10-CM | POA: Diagnosis not present

## 2024-05-17 DIAGNOSIS — Z9049 Acquired absence of other specified parts of digestive tract: Secondary | ICD-10-CM | POA: Diagnosis not present

## 2024-05-21 DIAGNOSIS — Z9049 Acquired absence of other specified parts of digestive tract: Secondary | ICD-10-CM | POA: Diagnosis not present

## 2024-05-21 DIAGNOSIS — Z930 Tracheostomy status: Secondary | ICD-10-CM | POA: Diagnosis not present

## 2024-05-21 DIAGNOSIS — C020 Malignant neoplasm of dorsal surface of tongue: Secondary | ICD-10-CM | POA: Diagnosis not present

## 2024-05-27 DIAGNOSIS — E559 Vitamin D deficiency, unspecified: Secondary | ICD-10-CM | POA: Diagnosis not present

## 2024-05-27 DIAGNOSIS — E1169 Type 2 diabetes mellitus with other specified complication: Secondary | ICD-10-CM | POA: Diagnosis not present

## 2024-05-27 DIAGNOSIS — Z125 Encounter for screening for malignant neoplasm of prostate: Secondary | ICD-10-CM | POA: Diagnosis not present

## 2024-05-27 DIAGNOSIS — I10 Essential (primary) hypertension: Secondary | ICD-10-CM | POA: Diagnosis not present

## 2024-05-27 DIAGNOSIS — M109 Gout, unspecified: Secondary | ICD-10-CM | POA: Diagnosis not present

## 2024-05-30 DIAGNOSIS — R911 Solitary pulmonary nodule: Secondary | ICD-10-CM | POA: Diagnosis not present

## 2024-05-30 DIAGNOSIS — F330 Major depressive disorder, recurrent, mild: Secondary | ICD-10-CM | POA: Diagnosis not present

## 2024-05-30 DIAGNOSIS — R413 Other amnesia: Secondary | ICD-10-CM | POA: Diagnosis not present

## 2024-05-30 DIAGNOSIS — E559 Vitamin D deficiency, unspecified: Secondary | ICD-10-CM | POA: Diagnosis not present

## 2024-05-30 DIAGNOSIS — Z7189 Other specified counseling: Secondary | ICD-10-CM | POA: Diagnosis not present

## 2024-05-30 DIAGNOSIS — E1165 Type 2 diabetes mellitus with hyperglycemia: Secondary | ICD-10-CM | POA: Diagnosis not present

## 2024-05-30 DIAGNOSIS — R634 Abnormal weight loss: Secondary | ICD-10-CM | POA: Diagnosis not present

## 2024-05-30 DIAGNOSIS — C029 Malignant neoplasm of tongue, unspecified: Secondary | ICD-10-CM | POA: Diagnosis not present

## 2024-05-30 DIAGNOSIS — M109 Gout, unspecified: Secondary | ICD-10-CM | POA: Diagnosis not present

## 2024-05-30 DIAGNOSIS — I484 Atypical atrial flutter: Secondary | ICD-10-CM | POA: Diagnosis not present

## 2024-05-30 DIAGNOSIS — E782 Mixed hyperlipidemia: Secondary | ICD-10-CM | POA: Diagnosis not present

## 2024-05-30 DIAGNOSIS — I10 Essential (primary) hypertension: Secondary | ICD-10-CM | POA: Diagnosis not present

## 2024-05-30 LAB — LAB REPORT - SCANNED
A1c: 6.3
Albumin, Urine POC: 33.8
Albumin/Creatinine Ratio, Urine, POC: 34
Creatinine, POC: 100.4 mg/dL
EGFR: 94
PSA, Total: 1.5

## 2024-06-01 ENCOUNTER — Encounter: Payer: Self-pay | Admitting: Dermatology

## 2024-06-01 DIAGNOSIS — R911 Solitary pulmonary nodule: Secondary | ICD-10-CM | POA: Diagnosis not present

## 2024-06-01 DIAGNOSIS — Z860100 Personal history of colon polyps, unspecified: Secondary | ICD-10-CM | POA: Diagnosis not present

## 2024-06-01 DIAGNOSIS — R600 Localized edema: Secondary | ICD-10-CM | POA: Diagnosis not present

## 2024-06-01 DIAGNOSIS — E782 Mixed hyperlipidemia: Secondary | ICD-10-CM | POA: Diagnosis not present

## 2024-06-01 DIAGNOSIS — Z9889 Other specified postprocedural states: Secondary | ICD-10-CM | POA: Diagnosis not present

## 2024-06-01 DIAGNOSIS — D6869 Other thrombophilia: Secondary | ICD-10-CM | POA: Diagnosis not present

## 2024-06-01 DIAGNOSIS — E1169 Type 2 diabetes mellitus with other specified complication: Secondary | ICD-10-CM | POA: Diagnosis not present

## 2024-06-01 DIAGNOSIS — I2510 Atherosclerotic heart disease of native coronary artery without angina pectoris: Secondary | ICD-10-CM | POA: Diagnosis not present

## 2024-06-01 DIAGNOSIS — C029 Malignant neoplasm of tongue, unspecified: Secondary | ICD-10-CM | POA: Diagnosis not present

## 2024-06-01 DIAGNOSIS — I10 Essential (primary) hypertension: Secondary | ICD-10-CM | POA: Diagnosis not present

## 2024-06-01 DIAGNOSIS — E1159 Type 2 diabetes mellitus with other circulatory complications: Secondary | ICD-10-CM | POA: Diagnosis not present

## 2024-06-01 DIAGNOSIS — M109 Gout, unspecified: Secondary | ICD-10-CM | POA: Diagnosis not present

## 2024-06-17 DIAGNOSIS — I10 Essential (primary) hypertension: Secondary | ICD-10-CM | POA: Diagnosis not present

## 2024-06-17 DIAGNOSIS — E559 Vitamin D deficiency, unspecified: Secondary | ICD-10-CM | POA: Diagnosis not present

## 2024-06-17 DIAGNOSIS — F330 Major depressive disorder, recurrent, mild: Secondary | ICD-10-CM | POA: Diagnosis not present

## 2024-06-17 DIAGNOSIS — E782 Mixed hyperlipidemia: Secondary | ICD-10-CM | POA: Diagnosis not present

## 2024-06-17 DIAGNOSIS — C029 Malignant neoplasm of tongue, unspecified: Secondary | ICD-10-CM | POA: Diagnosis not present

## 2024-06-17 DIAGNOSIS — G479 Sleep disorder, unspecified: Secondary | ICD-10-CM | POA: Diagnosis not present

## 2024-06-17 DIAGNOSIS — I484 Atypical atrial flutter: Secondary | ICD-10-CM | POA: Diagnosis not present

## 2024-06-17 DIAGNOSIS — R413 Other amnesia: Secondary | ICD-10-CM | POA: Diagnosis not present

## 2024-06-17 DIAGNOSIS — M109 Gout, unspecified: Secondary | ICD-10-CM | POA: Diagnosis not present

## 2024-06-17 DIAGNOSIS — R634 Abnormal weight loss: Secondary | ICD-10-CM | POA: Diagnosis not present

## 2024-06-17 DIAGNOSIS — E1165 Type 2 diabetes mellitus with hyperglycemia: Secondary | ICD-10-CM | POA: Diagnosis not present

## 2024-06-17 DIAGNOSIS — R911 Solitary pulmonary nodule: Secondary | ICD-10-CM | POA: Diagnosis not present

## 2024-06-20 DIAGNOSIS — I10 Essential (primary) hypertension: Secondary | ICD-10-CM | POA: Diagnosis not present

## 2024-06-20 DIAGNOSIS — C029 Malignant neoplasm of tongue, unspecified: Secondary | ICD-10-CM | POA: Diagnosis not present

## 2024-06-20 DIAGNOSIS — E1165 Type 2 diabetes mellitus with hyperglycemia: Secondary | ICD-10-CM | POA: Diagnosis not present

## 2024-06-20 DIAGNOSIS — E782 Mixed hyperlipidemia: Secondary | ICD-10-CM | POA: Diagnosis not present

## 2024-06-21 DIAGNOSIS — C020 Malignant neoplasm of dorsal surface of tongue: Secondary | ICD-10-CM | POA: Diagnosis not present

## 2024-06-21 DIAGNOSIS — E119 Type 2 diabetes mellitus without complications: Secondary | ICD-10-CM | POA: Diagnosis not present

## 2024-06-21 DIAGNOSIS — Z9049 Acquired absence of other specified parts of digestive tract: Secondary | ICD-10-CM | POA: Diagnosis not present

## 2024-06-22 ENCOUNTER — Ambulatory Visit: Attending: Cardiovascular Disease | Admitting: *Deleted

## 2024-06-22 DIAGNOSIS — Z5181 Encounter for therapeutic drug level monitoring: Secondary | ICD-10-CM | POA: Diagnosis not present

## 2024-06-22 DIAGNOSIS — I4891 Unspecified atrial fibrillation: Secondary | ICD-10-CM

## 2024-06-22 LAB — POCT INR: INR: 2 (ref 2.0–3.0)

## 2024-06-22 NOTE — Patient Instructions (Signed)
 Continue to take warfarin  2 tablets daily except 1 tablet on Mondays, Wednesdays and Fridays. S/P oral surgery on tongue 5/24. On 12oz of Glucerna 5 x day via PEG tube Started radiation 07/01/23. Recheck in 6 weeks

## 2024-06-22 NOTE — Progress Notes (Signed)
Please see anticoagulation encounter.

## 2024-06-28 DIAGNOSIS — C029 Malignant neoplasm of tongue, unspecified: Secondary | ICD-10-CM | POA: Diagnosis not present

## 2024-06-28 DIAGNOSIS — R1312 Dysphagia, oropharyngeal phase: Secondary | ICD-10-CM | POA: Diagnosis not present

## 2024-07-12 DIAGNOSIS — R634 Abnormal weight loss: Secondary | ICD-10-CM | POA: Diagnosis not present

## 2024-07-12 DIAGNOSIS — E1165 Type 2 diabetes mellitus with hyperglycemia: Secondary | ICD-10-CM | POA: Diagnosis not present

## 2024-07-12 DIAGNOSIS — G479 Sleep disorder, unspecified: Secondary | ICD-10-CM | POA: Diagnosis not present

## 2024-07-12 DIAGNOSIS — C029 Malignant neoplasm of tongue, unspecified: Secondary | ICD-10-CM | POA: Diagnosis not present

## 2024-07-12 DIAGNOSIS — I484 Atypical atrial flutter: Secondary | ICD-10-CM | POA: Diagnosis not present

## 2024-07-12 DIAGNOSIS — R413 Other amnesia: Secondary | ICD-10-CM | POA: Diagnosis not present

## 2024-07-12 DIAGNOSIS — E782 Mixed hyperlipidemia: Secondary | ICD-10-CM | POA: Diagnosis not present

## 2024-07-12 DIAGNOSIS — M109 Gout, unspecified: Secondary | ICD-10-CM | POA: Diagnosis not present

## 2024-07-12 DIAGNOSIS — I10 Essential (primary) hypertension: Secondary | ICD-10-CM | POA: Diagnosis not present

## 2024-07-12 DIAGNOSIS — F330 Major depressive disorder, recurrent, mild: Secondary | ICD-10-CM | POA: Diagnosis not present

## 2024-07-12 DIAGNOSIS — E559 Vitamin D deficiency, unspecified: Secondary | ICD-10-CM | POA: Diagnosis not present

## 2024-07-12 DIAGNOSIS — R911 Solitary pulmonary nodule: Secondary | ICD-10-CM | POA: Diagnosis not present

## 2024-07-21 DIAGNOSIS — I10 Essential (primary) hypertension: Secondary | ICD-10-CM | POA: Diagnosis not present

## 2024-07-21 DIAGNOSIS — F330 Major depressive disorder, recurrent, mild: Secondary | ICD-10-CM | POA: Diagnosis not present

## 2024-07-21 DIAGNOSIS — E1165 Type 2 diabetes mellitus with hyperglycemia: Secondary | ICD-10-CM | POA: Diagnosis not present

## 2024-07-21 DIAGNOSIS — E782 Mixed hyperlipidemia: Secondary | ICD-10-CM | POA: Diagnosis not present

## 2024-08-03 ENCOUNTER — Ambulatory Visit: Attending: Cardiovascular Disease | Admitting: *Deleted

## 2024-08-03 DIAGNOSIS — I4891 Unspecified atrial fibrillation: Secondary | ICD-10-CM

## 2024-08-03 DIAGNOSIS — Z5181 Encounter for therapeutic drug level monitoring: Secondary | ICD-10-CM

## 2024-08-03 LAB — POCT INR: INR: 5.7 — AB (ref 2.0–3.0)

## 2024-08-03 NOTE — Patient Instructions (Addendum)
 Hold warfarin x 3 days then resume 2 tablets daily except 1 tablet on Mondays, Wednesdays and Fridays. S/P oral surgery on tongue 5/24. On 6 cans on Ensure Plus daily Recheck in 1 week Pt denies bleeding or excessive bruising.  Bleeding and fall precautions discussed with pt and he verbalized understanding.

## 2024-08-03 NOTE — Progress Notes (Signed)
 INR 5.7; Please see anticoagulation encounter

## 2024-08-09 DIAGNOSIS — C029 Malignant neoplasm of tongue, unspecified: Secondary | ICD-10-CM | POA: Diagnosis not present

## 2024-08-09 DIAGNOSIS — I10 Essential (primary) hypertension: Secondary | ICD-10-CM | POA: Diagnosis not present

## 2024-08-09 DIAGNOSIS — F330 Major depressive disorder, recurrent, mild: Secondary | ICD-10-CM | POA: Diagnosis not present

## 2024-08-09 DIAGNOSIS — E782 Mixed hyperlipidemia: Secondary | ICD-10-CM | POA: Diagnosis not present

## 2024-08-09 DIAGNOSIS — R911 Solitary pulmonary nodule: Secondary | ICD-10-CM | POA: Diagnosis not present

## 2024-08-09 DIAGNOSIS — E1165 Type 2 diabetes mellitus with hyperglycemia: Secondary | ICD-10-CM | POA: Diagnosis not present

## 2024-08-09 DIAGNOSIS — I484 Atypical atrial flutter: Secondary | ICD-10-CM | POA: Diagnosis not present

## 2024-08-09 DIAGNOSIS — M109 Gout, unspecified: Secondary | ICD-10-CM | POA: Diagnosis not present

## 2024-08-09 DIAGNOSIS — G479 Sleep disorder, unspecified: Secondary | ICD-10-CM | POA: Diagnosis not present

## 2024-08-09 DIAGNOSIS — E559 Vitamin D deficiency, unspecified: Secondary | ICD-10-CM | POA: Diagnosis not present

## 2024-08-09 DIAGNOSIS — R413 Other amnesia: Secondary | ICD-10-CM | POA: Diagnosis not present

## 2024-08-09 DIAGNOSIS — R634 Abnormal weight loss: Secondary | ICD-10-CM | POA: Diagnosis not present

## 2024-08-10 ENCOUNTER — Ambulatory Visit: Attending: Cardiovascular Disease | Admitting: *Deleted

## 2024-08-10 DIAGNOSIS — Z5181 Encounter for therapeutic drug level monitoring: Secondary | ICD-10-CM | POA: Diagnosis not present

## 2024-08-10 DIAGNOSIS — I4891 Unspecified atrial fibrillation: Secondary | ICD-10-CM

## 2024-08-10 LAB — POCT INR: INR: 2.2 (ref 2.0–3.0)

## 2024-08-10 NOTE — Progress Notes (Signed)
 INR 2.2; Please see anticoagulation encounter

## 2024-08-10 NOTE — Patient Instructions (Signed)
 Continue warfarin 2 tablets daily except 1 tablet on Mondays, Wednesdays and Fridays. S/P oral surgery on tongue 5/24. On 6 cans on Ensure Plus daily Recheck in 1 week

## 2024-08-17 DIAGNOSIS — K9422 Gastrostomy infection: Secondary | ICD-10-CM | POA: Diagnosis not present

## 2024-08-19 DIAGNOSIS — I10 Essential (primary) hypertension: Secondary | ICD-10-CM | POA: Diagnosis not present

## 2024-08-19 DIAGNOSIS — F330 Major depressive disorder, recurrent, mild: Secondary | ICD-10-CM | POA: Diagnosis not present

## 2024-08-19 DIAGNOSIS — E1165 Type 2 diabetes mellitus with hyperglycemia: Secondary | ICD-10-CM | POA: Diagnosis not present

## 2024-08-19 DIAGNOSIS — E782 Mixed hyperlipidemia: Secondary | ICD-10-CM | POA: Diagnosis not present

## 2024-08-30 ENCOUNTER — Ambulatory Visit: Attending: Cardiovascular Disease | Admitting: *Deleted

## 2024-08-30 DIAGNOSIS — I4891 Unspecified atrial fibrillation: Secondary | ICD-10-CM

## 2024-08-30 DIAGNOSIS — Z5181 Encounter for therapeutic drug level monitoring: Secondary | ICD-10-CM

## 2024-08-30 LAB — POCT INR: INR: 1 — AB (ref 2.0–3.0)

## 2024-08-30 NOTE — Progress Notes (Signed)
 INR 1.0; Please see anticoagulation encounter

## 2024-08-30 NOTE — Patient Instructions (Signed)
 Removed PRG tube on 8/27.  All oral intake is coming out of PEG site so pt stopped taking his warfarin and other meds.  Has appt to go back for PEG site closure this week. Restart warfarin 2 tablets daily except 1 tablet on Mondays, Wednesdays and Fridays. S/P oral surgery on tongue 5/24. On 6 cans on Ensure Plus daily Recheck in 1 week

## 2024-08-31 DIAGNOSIS — I10 Essential (primary) hypertension: Secondary | ICD-10-CM | POA: Diagnosis not present

## 2024-08-31 DIAGNOSIS — I484 Atypical atrial flutter: Secondary | ICD-10-CM | POA: Diagnosis not present

## 2024-08-31 DIAGNOSIS — E559 Vitamin D deficiency, unspecified: Secondary | ICD-10-CM | POA: Diagnosis not present

## 2024-08-31 DIAGNOSIS — E1165 Type 2 diabetes mellitus with hyperglycemia: Secondary | ICD-10-CM | POA: Diagnosis not present

## 2024-08-31 DIAGNOSIS — R634 Abnormal weight loss: Secondary | ICD-10-CM | POA: Diagnosis not present

## 2024-08-31 DIAGNOSIS — C029 Malignant neoplasm of tongue, unspecified: Secondary | ICD-10-CM | POA: Diagnosis not present

## 2024-08-31 DIAGNOSIS — E782 Mixed hyperlipidemia: Secondary | ICD-10-CM | POA: Diagnosis not present

## 2024-08-31 DIAGNOSIS — R413 Other amnesia: Secondary | ICD-10-CM | POA: Diagnosis not present

## 2024-08-31 DIAGNOSIS — F330 Major depressive disorder, recurrent, mild: Secondary | ICD-10-CM | POA: Diagnosis not present

## 2024-08-31 DIAGNOSIS — R911 Solitary pulmonary nodule: Secondary | ICD-10-CM | POA: Diagnosis not present

## 2024-08-31 DIAGNOSIS — G479 Sleep disorder, unspecified: Secondary | ICD-10-CM | POA: Diagnosis not present

## 2024-08-31 DIAGNOSIS — M109 Gout, unspecified: Secondary | ICD-10-CM | POA: Diagnosis not present

## 2024-09-01 DIAGNOSIS — L929 Granulomatous disorder of the skin and subcutaneous tissue, unspecified: Secondary | ICD-10-CM | POA: Diagnosis not present

## 2024-09-06 ENCOUNTER — Ambulatory Visit: Attending: Cardiovascular Disease | Admitting: *Deleted

## 2024-09-06 DIAGNOSIS — I4891 Unspecified atrial fibrillation: Secondary | ICD-10-CM

## 2024-09-06 DIAGNOSIS — Z5181 Encounter for therapeutic drug level monitoring: Secondary | ICD-10-CM

## 2024-09-06 LAB — POCT INR: INR: 1.5 — AB (ref 2.0–3.0)

## 2024-09-06 NOTE — Patient Instructions (Signed)
 Increase warfarin to 2 tablets daily except 1 tablet on Mondays S/P oral surgery on tongue 5/24. On 6 cans on Ensure Plus daily Recheck in 1 week

## 2024-09-06 NOTE — Progress Notes (Signed)
 INR-1.5; Please see anticoagulation encounter

## 2024-09-13 ENCOUNTER — Ambulatory Visit: Attending: Cardiovascular Disease | Admitting: *Deleted

## 2024-09-13 DIAGNOSIS — I4891 Unspecified atrial fibrillation: Secondary | ICD-10-CM | POA: Diagnosis not present

## 2024-09-13 DIAGNOSIS — Z5181 Encounter for therapeutic drug level monitoring: Secondary | ICD-10-CM | POA: Diagnosis not present

## 2024-09-13 LAB — POCT INR: INR: 2.8 (ref 2.0–3.0)

## 2024-09-13 NOTE — Patient Instructions (Signed)
 Continue warfarin 2 tablets daily except 1 tablet on Mondays S/P oral surgery on tongue 5/24. On 6 cans on Ensure Plus daily Recheck in 2 weeks

## 2024-09-13 NOTE — Progress Notes (Signed)
 INR-2.8 Please see anticoagulation encounter

## 2024-09-14 DIAGNOSIS — M109 Gout, unspecified: Secondary | ICD-10-CM | POA: Diagnosis not present

## 2024-09-14 DIAGNOSIS — E1165 Type 2 diabetes mellitus with hyperglycemia: Secondary | ICD-10-CM | POA: Diagnosis not present

## 2024-09-14 DIAGNOSIS — F330 Major depressive disorder, recurrent, mild: Secondary | ICD-10-CM | POA: Diagnosis not present

## 2024-09-14 DIAGNOSIS — E782 Mixed hyperlipidemia: Secondary | ICD-10-CM | POA: Diagnosis not present

## 2024-09-14 DIAGNOSIS — R413 Other amnesia: Secondary | ICD-10-CM | POA: Diagnosis not present

## 2024-09-14 DIAGNOSIS — G479 Sleep disorder, unspecified: Secondary | ICD-10-CM | POA: Diagnosis not present

## 2024-09-14 DIAGNOSIS — C029 Malignant neoplasm of tongue, unspecified: Secondary | ICD-10-CM | POA: Diagnosis not present

## 2024-09-14 DIAGNOSIS — I484 Atypical atrial flutter: Secondary | ICD-10-CM | POA: Diagnosis not present

## 2024-09-14 DIAGNOSIS — R634 Abnormal weight loss: Secondary | ICD-10-CM | POA: Diagnosis not present

## 2024-09-14 DIAGNOSIS — R911 Solitary pulmonary nodule: Secondary | ICD-10-CM | POA: Diagnosis not present

## 2024-09-14 DIAGNOSIS — E559 Vitamin D deficiency, unspecified: Secondary | ICD-10-CM | POA: Diagnosis not present

## 2024-09-14 DIAGNOSIS — I10 Essential (primary) hypertension: Secondary | ICD-10-CM | POA: Diagnosis not present

## 2024-09-20 ENCOUNTER — Encounter: Payer: Self-pay | Admitting: Cardiovascular Disease

## 2024-09-20 ENCOUNTER — Ambulatory Visit: Attending: Cardiovascular Disease | Admitting: Cardiovascular Disease

## 2024-09-20 VITALS — BP 100/70 | HR 87 | Ht 73.0 in | Wt 160.0 lb

## 2024-09-20 DIAGNOSIS — I4719 Other supraventricular tachycardia: Secondary | ICD-10-CM | POA: Diagnosis not present

## 2024-09-20 DIAGNOSIS — I4892 Unspecified atrial flutter: Secondary | ICD-10-CM

## 2024-09-20 DIAGNOSIS — Z9889 Other specified postprocedural states: Secondary | ICD-10-CM | POA: Diagnosis not present

## 2024-09-20 DIAGNOSIS — I2510 Atherosclerotic heart disease of native coronary artery without angina pectoris: Secondary | ICD-10-CM

## 2024-09-20 DIAGNOSIS — I484 Atypical atrial flutter: Secondary | ICD-10-CM | POA: Diagnosis not present

## 2024-09-20 DIAGNOSIS — I4891 Unspecified atrial fibrillation: Secondary | ICD-10-CM

## 2024-09-20 DIAGNOSIS — I10 Essential (primary) hypertension: Secondary | ICD-10-CM

## 2024-09-20 DIAGNOSIS — D6869 Other thrombophilia: Secondary | ICD-10-CM

## 2024-09-20 DIAGNOSIS — E1165 Type 2 diabetes mellitus with hyperglycemia: Secondary | ICD-10-CM | POA: Diagnosis not present

## 2024-09-20 DIAGNOSIS — E782 Mixed hyperlipidemia: Secondary | ICD-10-CM | POA: Diagnosis not present

## 2024-09-20 DIAGNOSIS — F330 Major depressive disorder, recurrent, mild: Secondary | ICD-10-CM | POA: Diagnosis not present

## 2024-09-20 DIAGNOSIS — E785 Hyperlipidemia, unspecified: Secondary | ICD-10-CM | POA: Diagnosis not present

## 2024-09-20 DIAGNOSIS — E119 Type 2 diabetes mellitus without complications: Secondary | ICD-10-CM

## 2024-09-20 NOTE — Patient Instructions (Signed)
 Medication Instructions:  No changes *If you need a refill on your cardiac medications before your next appointment, please call your pharmacy*  Lab Work: None ordered If you have labs (blood work) drawn today and your tests are completely normal, you will receive your results only by: MyChart Message (if you have MyChart) OR A paper copy in the mail If you have any lab test that is abnormal or we need to change your treatment, we will call you to review the results.  Testing/Procedures: Your physician has requested that you have an echocardiogram. Echocardiography is a painless test that uses sound waves to create images of your heart. It provides your doctor with information about the size and shape of your heart and how well your heart's chambers and valves are working. This procedure takes approximately one hour. There are no restrictions for this procedure. Please do NOT wear cologne, perfume, aftershave, or lotions (deodorant is allowed). Please arrive 15 minutes prior to your appointment time.  Please note: We ask at that you not bring children with you during ultrasound (echo/ vascular) testing. Due to room size and safety concerns, children are not allowed in the ultrasound rooms during exams. Our front office staff cannot provide observation of children in our lobby area while testing is being conducted. An adult accompanying a patient to their appointment will only be allowed in the ultrasound room at the discretion of the ultrasound technician under special circumstances. We apologize for any inconvenience.   You can look into the [facility] device by [employer]. This device is purchased by you and it connects to an application you download to your smart phone.  It can detect abnormal heart rhythms and alert you to contact your doctor for further evaluation. The web site is:  [email or web address].[employer].[email or web address]    Follow-Up: At [facility], you and your health needs are our  priority.  As part of our continuing mission to provide you with exceptional heart care, our providers are all part of one team.  This team includes your primary Cardiologist (physician) and Advanced Practice Providers or APPs (Physician Assistants and Nurse Practitioners) who all work together to provide you with the care you need, when you need it.  Your next appointment:   1 year(s)  Provider:   Jerel Balding, MD    We recommend signing up for the patient portal called MyChart.  Sign up information is provided on this After Visit Summary.  MyChart is used to connect with patients for Virtual Visits (Telemedicine).  Patients are able to view lab/test results, encounter notes, upcoming appointments, etc.  Non-urgent messages can be sent to your provider as well.   To learn more about what you can do with MyChart, go to [email or web address].

## 2024-09-20 NOTE — Progress Notes (Unsigned)
 Cardiology Office Note    Date:  09/20/2024   ID:  Jacob [name], DOB 1951-09-24, MRN [id number]  PCP:  Jacob Norleen PEDLAR, MD  Cardiologist:   Jacob Balding, MD   No chief complaint on file.   History of Present Illness:  Jacob [name] is a 73 y.o. male who presents for follow up for atrial arrhythmia (atypical atrial flutter and atrial fibrillation) and history of MV repair (2005, 28 mm annuloplasty ring, Dr. Dusty) with oversewing of the left atrial appendage.  He has a history of ablation for AV node reentry in 2005 as well.  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  He still has a lot of issues with swallowing and has very thick stringy saliva ever since he had radiation therapy.  His gastric tube has been removed and he along longer has a tracheostomy.  He is drinking 6 boost shakes a day but cannot really eat regular food.  Had lost down to a minimum weight of 152, but is now back up to 160 pounds.  He has very little in way of cardiac complaints but he did have palpitations associated shortness of breath, occurring at rest, lasting for about 2 hours, roughly 2 weeks ago.  Has not had dizziness or syncope.  Has no shortness of breath or chest pain with physical activity.  Has not had any falls or bleeding problems.    It has been almost 5 years since we last did an echocardiogram.  In May 2021 the echocardiogram showed normal left ventricular systolic function and mild increase in gradients across the mitral annulus (mean gradient 5 mmHg, heart rate 80 bpm).  The nuclear stress test performed in May  2021 showed a normal pattern of perfusion except for small apical attenuation artifact.  Metabolic control is good.   Hemoglobin A1c is 6.3% and LDL cholesterol is 53.  He has minimally elevated triglycerides at 198.  He has normal renal function.  He had some problems with left flank and left lower quadrant pain.  A CT of the abdomen did not identify an etiology, but there was a lot of delay between the symptoms and the CT.  This did show bilateral small inguinal hernias with fat but no intestinal content and thickening of the wall duodenum concerning for duodenitis versus mass. The CT of the abdomen also showed substantial atherosclerosis of the abdominal aorta, without evidence of aneurysm.  In January 2016, he developed very rapid atrial tachycardia (scar related left atrial flutter?) at about 220 bpm during exercise testing. This is likely the cause of his intermittent palpitations. 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 He has been poorly tolerant to multiple beta blockers (metoprolol  and carvedilol  cause fatigue, nebivolol  made him to always be in a bad mood).  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 At that time his left atrial appendage was oversewn and a patent foramen ovale was closed. Preoperative cardiac catheterization showed noncritical coronary atherosclerosis. Repeat cardiac catheterization in 2011 again showed minimal irregularities, the worst lesion being  a 40% stenosis in the mid right coronary artery. He has had a couple of normal nuclear stress tests, most recently in 2009 and normal treadmill stress test most recently January 2016.   He also has a history of hypertension and dyslipidemia and  diabetes mellitus type 2. He quit smoking in 1986.    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 He cannot wear an event monitor since he is allergic to the adhesive pads. He was intolerant to metoprolol  and carvedilol  (fatigue). Bystolic  caused excessive irritability.  Most recent echo in June 2021 showed normal left ventricular systolic function, EF 60 to 65%, no residual mitral regurgitation and mild transmitral gradient of 5 mmHg at 80 bpm.  A nuclear stress test performed in May 2021 showed normal perfusion artifact.   Past Medical History:  Diagnosis Date  . A-fib (HCC)   . CAD (coronary artery disease)   . Cancer of tongue (HCC) 05/15/2023  . Dyslipidemia   . Essential (primary) hypertension   . Gout   . Hyperlipidemia, unspecified   . Hypertension   . 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 Other fatigue   . Other specified soft tissue disorders   . Rheumatic mitral valve disease, unspecified   . S/P mitral valve replacement 12/11/2004   Edwards ring  . Type 2 diabetes mellitus without complications (HCC)   . Unspecified atrial fibrillation [facility])     Past Surgical History:  Procedure Laterality Date  . BIOPSY  09/23/2021   Procedure: BIOPSY;  Surgeon: Cindie Carlin POUR, DO;  Location: [facility];  Service: Endoscopy;;  . CARDIAC CATHETERIZATION  12/03/2004   sign. one vessel disease,severe MR  . CARDIAC CATHETERIZATION  01/19/2006   normal  . CARDIAC CATHETERIZATION  06/06/2010   mild nonobstructive CAD  . COLONOSCOPY WITH PROPOFOL  N/A 09/23/2021   Procedure: COLONOSCOPY WITH PROPOFOL ;  Surgeon: Cindie Carlin POUR, DO;  Location: [facility];  Service: Endoscopy;  Laterality: N/A;  9:15am  . cyst  1980   removed from wrist  . ESOPHAGOGASTRODUODENOSCOPY (EGD) WITH PROPOFOL  N/A 09/23/2021   Procedure: ESOPHAGOGASTRODUODENOSCOPY (EGD) WITH PROPOFOL ;  Surgeon: Cindie Carlin POUR, DO;  Location: [facility];  Service: Endoscopy;  Laterality: N/A;  . IR PATIENT EVAL TECH 0-60 MINS   08/11/2023  . MITRAL VALVE SURGERY  12/11/2004   MV repair with Celestia ring,closure of PFO  . POLYPECTOMY  09/23/2021   Procedure: POLYPECTOMY;  Surgeon: Cindie Carlin POUR, DO;  Location: [facility];  Service: Endoscopy;;  . RADIOFREQUENCY ABLATION  12/02/2004   Dr. Sunnie  . SALIVARY GLAND SURGERY  2004   growth  . tongue cancer  05/15/2023  . TONSILLECTOMY  1959    Outpatient Medications Prior to Visit  Medication Sig Dispense Refill  . acetaminophen  (TYLENOL ) 500 MG tablet Take 1,000 mg by mouth 2 (two) times daily as needed for mild pain or headache.    . allopurinol  (ZYLOPRIM ) 300 MG tablet TAKE 1 TABLET BY MOUTH ONCE A DAY. 90 tablet 0  . gabapentin  (NEURONTIN ) 100 MG capsule Take 100 mg by mouth 3 (three) times daily.    SABRA loratadine (CLARITIN) 10 MG tablet     . pioglitazone  (ACTOS ) 15 MG tablet Take 15 mg by mouth daily.    . rosuvastatin  (CRESTOR ) 20 MG tablet Take 20 mg by mouth daily.    SABRA  sitaGLIPtin-metformin  (JANUMET) 50-1000 MG tablet Take 1 tablet by mouth daily.    . torsemide  (DEMADEX ) 20 MG tablet Take 1-2 tablets (20-40 mg total) by mouth daily. (Patient taking differently: Take 20 mg by mouth daily.) 90 tablet 1  . warfarin (COUMADIN ) 5 MG tablet TAKE 1-2 TABLETS BY MOUTH ONCE DAILY OR AS DIRECTED 60 tablet 5  . chlorhexidine  (HIBICLENS ) 4 % external liquid Apply topically daily as needed. (Patient not taking: Reported on 09/20/2024) 236 mL 0  . cholecalciferol (VITAMIN D3) 25 MCG (1000 UNIT) tablet Take 1,000 Units by mouth daily. (Patient not taking: Reported on 09/20/2024)    . hydrocortisone  cream 1 % Apply to affected area 2 times daily.  Placed over areas of redness where the bandages were but not directly over where the tube inserts. (Patient not taking: Reported on 09/20/2024) 15 g 0  . lidocaine  (XYLOCAINE ) 2 % solution Patient: Mix 1part 2% viscous lidocaine , 1part H20. Swish & swallow 10mL of diluted mixture, before meals and at bedtime, up to QID  (Patient not taking: Reported on 09/20/2024) 200 mL 3  . metFORMIN  (GLUCOPHAGE ) 1000 MG tablet Take 1,000 mg by mouth daily with breakfast. (Patient not taking: Reported on 09/20/2024)    . nystatin  (MYCOSTATIN ) 100000 UNIT/ML suspension Take 5 mLs (500,000 Units total) by mouth 4 (four) times daily. Swish for 1 minute and then swallow. Continue for 12 days. (Patient not taking: Reported on 09/20/2024) 240 mL 0  . omeprazole (PRILOSEC) 20 MG capsule Take 20 mg by mouth daily. (Patient not taking: Reported on 09/20/2024)    . scopolamine  (TRANSDERM-SCOP) 1 MG/3DAYS Place 1 patch (1.5 mg total) onto the skin every 3 (three) days. (Patient not taking: Reported on 09/20/2024) 10 patch 2  . sitaGLIPtin (JANUVIA) 100 MG tablet Take 100 mg by mouth daily. (Patient not taking: Reported on 09/20/2024)    . SODIUM FLUORIDE  5000 PPM 1.1 % PSTE Take 1 Application by mouth at bedtime. (Patient not taking: Reported on 09/20/2024)    . traMADol  (ULTRAM ) 50 MG tablet Take 1 tablet (50 mg total) by mouth every 6 (six) hours as needed. (Patient not taking: Reported on 09/20/2024) 30 tablet 0   No facility-administered medications prior to visit.     Allergies:   Ace inhibitors, Atenolol, Cardizem [diltiazem hcl], Latex, Melatonin, Metoprolol , and Clindamycin/lincomycin   Social History   Socioeconomic History  . Marital status: Married    Spouse name: Not on file  . Number of children: Not on file  . Years of education: Not on file  . Highest education level: Not on file  Occupational History  . Not on file  Tobacco Use  . Smoking status: Former    Current packs/day: 0.00    Types: Cigarettes    Start date: 39    Quit date: 1985    Years since quitting: 40.7  . Smokeless tobacco: Never  Vaping Use  . Vaping status: Never Used  Substance and Sexual Activity  . Alcohol use: No  . Drug use: No  . Sexual activity: Not on file  Other Topics Concern  . Not on file  Social History Narrative  . Not on file    Social Drivers of Health   Financial Resource Strain: Not on file  Food Insecurity: Low Risk  (05/16/2023)   Received from [facility]   Hunger Vital Sign   . Worried About Programme researcher, broadcasting/film/video in the Last Year: Never true   . Ran Out of Food  in the Last Year: Never true  Transportation Needs: No Transportation Needs (05/16/2023)   Received from [facility]   . In the past 12 months, has lack of reliable transportation kept you from medical appointments, meetings, work or from getting things needed for daily living? : No  Physical Activity: Not on file  Stress: Not on file  Social Connections: Not on file     Family History:  The patient's family history includes Cancer in his maternal grandmother and mother; Glaucoma in his mother.   ROS:   Please see the history of present illness.    ROS All other systems reviewed and are negative.   PHYSICAL EXAM:   VS:  BP 100/70   Pulse 87   Ht 6' 1 (1.854 m)   Wt 160 lb (72.6 kg)   SpO2 99%   BMI 21.11 kg/m      General: Alert, oriented x3, no distress, very lean Head: no evidence of trauma, PERRL, EOMI, no exophtalmos or lid lag, no myxedema, no xanthelasma; normal ears, nose and oropharynx Neck: normal jugular venous pulsations and no hepatojugular reflux; brisk carotid pulses without delay and no carotid bruits Chest: clear to auscultation, no signs of consolidation by percussion or palpation, normal fremitus, symmetrical and full respiratory excursions Cardiovascular: normal position and quality of the apical impulse, regular rhythm, normal first and second heart sounds, no murmurs, rubs or gallops Abdomen: no tenderness or distention, no masses by palpation, no abnormal pulsatility or arterial bruits, normal bowel sounds, no hepatosplenomegaly Extremities: no clubbing, cyanosis or edema; 2+ radial, ulnar and brachial pulses bilaterally; 2+ right femoral, posterior tibial and dorsalis pedis pulses; 2+ left  femoral, posterior tibial and dorsalis pedis pulses; no subclavian or femoral bruits Neurological: grossly nonfocal Psych: Normal mood and affect  Wt Readings from Last 3 Encounters:  09/20/24 160 lb (72.6 kg)  04/13/24 153 lb 9.6 oz (69.7 kg)  02/07/24 155 lb (70.3 kg)      Studies/Labs Reviewed:   EKG: EKG is ordered today and shows normal sinus rhythm, completely normal tracing.  ECHO 05/24/2020   1. Left ventricular ejection fraction, by estimation, is 60 to 65%. The left ventricle has normal function. The left ventricle has no regional wall motion abnormalities. Left ventricular diastolic parameters are indeterminate.   2. Right ventricular systolic function is normal. The right ventricular size is normal. There is normal pulmonary artery systolic pressure.   3. 28 mm Edwards physio angioplasty ring MV repair is present. Mild mean gradient across the valve of 5 mmHg. . The mitral valve has been repaired/replaced. No evidence of mitral valve regurgitation.   4. The aortic valve has an indeterminant number of cusps. Aortic valve regurgitation is not visualized. No aortic stenosis is present.   5. The inferior vena cava is normal in size with greater than 50% respiratory variability, suggesting right atrial pressure of 3 mmHg.   BMET    Component Value Date/Time   NA 139 02/07/2024 1314   NA 141 03/19/2020 0807   K 4.3 02/07/2024 1314   CL 101 02/07/2024 1314   CO2 25 02/07/2024 1314   GLUCOSE 97 02/07/2024 1314   BUN 37 (H) 02/07/2024 1314   BUN 21 03/19/2020 0807   CREATININE 0.76 02/07/2024 1314   CREATININE 1.18 04/07/2023 1104   CALCIUM  9.9 02/07/2024 1314   GFRNONAA >60 02/07/2024 1314   GFRNONAA >60 04/07/2023 1104   GFRAA 72 03/19/2020 0807     Lipid Panel  Component Value Date/Time   CHOL 156 03/19/2020 0807   TRIG 116 03/19/2020 0807   HDL 31 (L) 03/19/2020 0807   CHOLHDL 5.0 03/19/2020 0807   CHOLHDL 4.7 01/27/2019 0832   VLDL 15 01/27/2019 0832    LDLCALC 104 (H) 03/19/2020 0807   LABVLDL 21 03/19/2020 0807   07/12/2021  Cholesterol 178, HDL 33, LDL 122, triglycerides 128  Hemoglobin 13.4, creatinine 0.95, hemoglobin A1c 7.0%  ASSESSMENT:    1. Atrial flutter, unspecified type (HCC)   2. Atrial fibrillation, unspecified type (HCC)      PLAN:  In order of problems listed above:   Atypical atrial flutter: He has not tolerated treatment with either beta-blockers (both atenolol and metoprolol  were tried) or diltiazem.  See treadmill stress test ECG tracings from 2017. Most likely has scar reentry flutter in the left atrium.  We have never documented atrial fibrillation. EP consultation was performed in 2019 but Mr. Rudden felt that his symptoms did not justify either antiarrhythmics or ablation.  CHADSVasc 3 (Age, HTN, DM).  Encouraged him to purchase a inexpensive outpatient electronic rhythm monitoring device, such as Kardia. Anticoagulation: on warfarin. He did have his left atrial appendage oversewn; he should be at lower embolic risk.  Threshold for discontinuation of anticoagulants is low if he should have bleeding complications. 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 HTN: Well-controlled DM: Persists despite the fact that he is remarkably lean.  Congratulated him on increased levels of physical activity.  Did not tolerate Rybelsus due to excessive reduction in food intake.  Current control is good (if cost were not a consideration, consider SGLT2 inhibitor as the next step if he requires more medications). CAD: He has never complained of angina pectoris.  He does have some history of coronary atherosclerosis by angiography.  Moderate stenosis of RCA (40%) at last cardiac catheterization.  Low risk nuclear stress test May  2021.   HLP:  low HDL despite being very lean and exercising daily, consistent with metabolic syndrome/insulin resistance.  Ideally would keep at target LDL less than 70.  He cannot afford PCSK9 inhibitors.   Medication Adjustments/Labs and Tests Ordered: Current medicines are reviewed at length with the patient today.  Concerns regarding medicines are outlined above.  Medication changes, Labs and Tests ordered today are listed in the Patient Instructions below. Patient Instructions  Medication Instructions:  *** *If you need a refill on your cardiac medications before your next appointment, please call your pharmacy*  Lab Work: *** If you have labs (blood work) drawn today and your tests are completely normal, you will receive your results only by: MyChart Message (if you have MyChart) OR A paper copy in the mail If you have any lab test that is abnormal or we need to change your treatment, we will call you to review the results.  Testing/Procedures: ***  Follow-Up: At [facility], you and your health needs are our priority.  As part of our continuing mission to provide you with exceptional heart care, our providers are all part of one team.  This team includes your primary Cardiologist (physician) and Advanced Practice Providers or APPs (Physician Assistants and Nurse Practitioners) who all work together to provide you with the care you need, when you need it.  Your next appointment:   {numbers [phone]:[id number]} {  Time; day/wk/mo/yr(s):9076}  Provider:   Jerel Balding, MD    We recommend signing up for the patient portal called MyChart.  Sign up information is provided on this After Visit Summary.  MyChart is used to connect with patients for Virtual Visits (Telemedicine).  Patients are able to view lab/test results, encounter notes, upcoming appointments, etc.  Non-urgent messages can be sent to your provider as well.   To learn more about what you can do with MyChart, go to  [email or web address].        Signed, Jacob Balding, MD  09/20/2024 10:51 AM    [facility] [address] [place], [place], [place]  [address] Phone: [phone]; Fax: [phone]

## 2024-09-27 ENCOUNTER — Encounter

## 2024-09-28 ENCOUNTER — Ambulatory Visit: Attending: Cardiovascular Disease | Admitting: *Deleted

## 2024-09-28 DIAGNOSIS — I4891 Unspecified atrial fibrillation: Secondary | ICD-10-CM | POA: Diagnosis not present

## 2024-09-28 DIAGNOSIS — Z5181 Encounter for therapeutic drug level monitoring: Secondary | ICD-10-CM

## 2024-09-28 LAB — POCT INR: INR: 4.8 — AB (ref 2.0–3.0)

## 2024-09-28 NOTE — Progress Notes (Signed)
 INR-4.8; Please see anticoagulation encounter

## 2024-09-28 NOTE — Patient Instructions (Signed)
 Hold warfarin tonight and tomorrow night then decrease dose to 2 tablets daily except 1 tablet on Mondays and Thursdays S/P oral surgery on tongue 5/24. On 6 cans on Ensure Plus daily Recheck in 2 weeks

## 2024-10-05 DIAGNOSIS — E1165 Type 2 diabetes mellitus with hyperglycemia: Secondary | ICD-10-CM | POA: Diagnosis not present

## 2024-10-05 DIAGNOSIS — I484 Atypical atrial flutter: Secondary | ICD-10-CM | POA: Diagnosis not present

## 2024-10-05 DIAGNOSIS — M109 Gout, unspecified: Secondary | ICD-10-CM | POA: Diagnosis not present

## 2024-10-05 DIAGNOSIS — E559 Vitamin D deficiency, unspecified: Secondary | ICD-10-CM | POA: Diagnosis not present

## 2024-10-05 DIAGNOSIS — C029 Malignant neoplasm of tongue, unspecified: Secondary | ICD-10-CM | POA: Diagnosis not present

## 2024-10-05 DIAGNOSIS — R911 Solitary pulmonary nodule: Secondary | ICD-10-CM | POA: Diagnosis not present

## 2024-10-05 DIAGNOSIS — F330 Major depressive disorder, recurrent, mild: Secondary | ICD-10-CM | POA: Diagnosis not present

## 2024-10-05 DIAGNOSIS — I10 Essential (primary) hypertension: Secondary | ICD-10-CM | POA: Diagnosis not present

## 2024-10-05 DIAGNOSIS — G479 Sleep disorder, unspecified: Secondary | ICD-10-CM | POA: Diagnosis not present

## 2024-10-05 DIAGNOSIS — E782 Mixed hyperlipidemia: Secondary | ICD-10-CM | POA: Diagnosis not present

## 2024-10-05 DIAGNOSIS — R413 Other amnesia: Secondary | ICD-10-CM | POA: Diagnosis not present

## 2024-10-05 DIAGNOSIS — R634 Abnormal weight loss: Secondary | ICD-10-CM | POA: Diagnosis not present

## 2024-10-07 ENCOUNTER — Encounter (HOSPITAL_COMMUNITY): Payer: Self-pay

## 2024-10-07 ENCOUNTER — Ambulatory Visit (HOSPITAL_COMMUNITY)
Admission: RE | Admit: 2024-10-07 | Discharge: 2024-10-07 | Disposition: A | Discharge status: Home / Self Care | Source: Ambulatory Visit | Attending: Radiation Oncology | Admitting: Radiation Oncology

## 2024-10-07 ENCOUNTER — Ambulatory Visit (HOSPITAL_COMMUNITY)
Admission: RE | Admit: 2024-10-07 | Discharge: 2024-10-07 | Disposition: A | Source: Ambulatory Visit | Attending: Radiation Oncology | Admitting: Radiation Oncology

## 2024-10-07 DIAGNOSIS — E119 Type 2 diabetes mellitus without complications: Secondary | ICD-10-CM | POA: Diagnosis not present

## 2024-10-07 DIAGNOSIS — C021 Malignant neoplasm of border of tongue: Secondary | ICD-10-CM

## 2024-10-07 DIAGNOSIS — C029 Malignant neoplasm of tongue, unspecified: Secondary | ICD-10-CM | POA: Insufficient documentation

## 2024-10-07 DIAGNOSIS — Z8589 Personal history of malignant neoplasm of other organs and systems: Secondary | ICD-10-CM | POA: Diagnosis not present

## 2024-10-07 DIAGNOSIS — R918 Other nonspecific abnormal finding of lung field: Secondary | ICD-10-CM | POA: Diagnosis not present

## 2024-10-07 LAB — POCT I-STAT CREATININE: Creatinine, Ser: 0.9 mg/dL (ref 0.61–1.24)

## 2024-10-07 MED ORDER — IOHEXOL 300 MG/ML  SOLN
75.0000 mL | Freq: Once | INTRAMUSCULAR | Status: AC | PRN
  Administered 2024-10-07: 75 mL via INTRAVENOUS

## 2024-10-07 MED ORDER — SODIUM CHLORIDE (PF) 0.9 % IJ SOLN
INTRAMUSCULAR | Status: AC
  Filled 2024-10-07: qty 50

## 2024-10-12 ENCOUNTER — Ambulatory Visit: Attending: Cardiovascular Disease | Admitting: *Deleted

## 2024-10-12 DIAGNOSIS — Z5181 Encounter for therapeutic drug level monitoring: Secondary | ICD-10-CM | POA: Diagnosis not present

## 2024-10-12 DIAGNOSIS — I4891 Unspecified atrial fibrillation: Secondary | ICD-10-CM | POA: Diagnosis not present

## 2024-10-12 LAB — POCT INR: INR: 3.7 — AB (ref 2.0–3.0)

## 2024-10-12 NOTE — Progress Notes (Signed)
 INR 3.7 Please see anticoagulation encounter

## 2024-10-12 NOTE — Patient Instructions (Addendum)
 Hold warfarin tonight then decrease dose to 1 tablet daily except 2 tablets on Mondays, Wednesdays and Fridays S/P oral surgery on tongue 5/24. On 6 cans on Ensure Plus daily Recheck in 2 weeks

## 2024-10-17 NOTE — Progress Notes (Incomplete)
 Patient identity verified x2     Treatment Completion Date: *** Pain issues, if any: *** Using a feeding tube?: *** Weight changes, if any: ***He is gradually recovering after losing a lot of weight during treatment.   Wt Readings from Last 4 Encounters: 10/18/24  09/20/24 160 lb (72.6 kg)  04/13/24 153 lb 9.6 oz (69.7 kg)  02/07/24 155 lb (70.3 kg)   Swallowing issues, if any: ***He still has a lot of issues with swallowing and has very thick stringy saliva ever since he had radiation therapy. Smoking or chewing tobacco? ***He quit smoking in 1986. He denies using chewing tobacco. Using fluoride  toothpaste daily? *** Last ENT visit was on: *** Other notable issues, if any: ***

## 2024-10-18 ENCOUNTER — Encounter: Payer: Self-pay | Admitting: Radiology

## 2024-10-18 ENCOUNTER — Ambulatory Visit
Admission: RE | Admit: 2024-10-18 | Discharge: 2024-10-18 | Disposition: A | Discharge status: Home / Self Care | Payer: Self-pay | Source: Ambulatory Visit | Attending: Radiology | Admitting: Radiology

## 2024-10-18 VITALS — BP 110/78 | HR 88 | Temp 97.4°F | Resp 20 | Ht 73.0 in | Wt 162.2 lb

## 2024-10-18 DIAGNOSIS — Z923 Personal history of irradiation: Secondary | ICD-10-CM | POA: Diagnosis not present

## 2024-10-18 DIAGNOSIS — J9811 Atelectasis: Secondary | ICD-10-CM | POA: Insufficient documentation

## 2024-10-18 DIAGNOSIS — Z7901 Long term (current) use of anticoagulants: Secondary | ICD-10-CM | POA: Diagnosis not present

## 2024-10-18 DIAGNOSIS — Z79899 Other long term (current) drug therapy: Secondary | ICD-10-CM | POA: Insufficient documentation

## 2024-10-18 DIAGNOSIS — Z7984 Long term (current) use of oral hypoglycemic drugs: Secondary | ICD-10-CM | POA: Insufficient documentation

## 2024-10-18 DIAGNOSIS — I700 Atherosclerosis of aorta: Secondary | ICD-10-CM | POA: Diagnosis not present

## 2024-10-18 DIAGNOSIS — R918 Other nonspecific abnormal finding of lung field: Secondary | ICD-10-CM | POA: Diagnosis not present

## 2024-10-18 DIAGNOSIS — R5383 Other fatigue: Secondary | ICD-10-CM | POA: Diagnosis not present

## 2024-10-18 DIAGNOSIS — R911 Solitary pulmonary nodule: Secondary | ICD-10-CM | POA: Insufficient documentation

## 2024-10-18 DIAGNOSIS — M47814 Spondylosis without myelopathy or radiculopathy, thoracic region: Secondary | ICD-10-CM | POA: Diagnosis not present

## 2024-10-18 DIAGNOSIS — C021 Malignant neoplasm of border of tongue: Secondary | ICD-10-CM | POA: Insufficient documentation

## 2024-10-18 NOTE — Progress Notes (Signed)
 Radiation Oncology         [phone] ________________________________  Name: Jacob [name] MRN: [id number]  Date: 10/18/2024  DOB: 01/25/1951  Follow-Up Visit Note  CC: Shona Norleen PEDLAR, MD  Shona Norleen PEDLAR, MD  Diagnosis and Prior Radiotherapy:    C02.1   ICD-10-CM   1. 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)  C02.1        Cancer Staging  Squamous cell carcinoma of tongue (HCC) Staging form: Oral Cavity, AJCC 8th Edition - Clinical stage from 04/17/2023: Stage IVA (cT2, cN2c, cM0) - Signed by Izell Domino, MD on 04/17/2023 Stage prefix: Initial diagnosis   CHIEF COMPLAINT:  Here for follow-up and surveillance of tongue cancer  ==========DELIVERED PLANS==========  First Treatment Date: 2023-06-26 - Last Treatment Date: 2023-08-11   Plan Name: HN_Tongue Site: Tongue Technique: IMRT Mode: Photon Dose Per Fraction: 2 Gy Prescribed Dose (Delivered / Prescribed): 60 Gy / 60 Gy Prescribed Fxs (Delivered / Prescribed): 30 / 30  Stage IVA (cT2, cN2c, 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  He completed radiation therapy for Malignant neoplasm of border of tongue on 08/11/2023.  CT of the neck and chest on 10/07/2024 demonstrated no recurrent mass or adenopathy; a new irregular 4 mm left lower lobe pulmonary nodule, favored to be infectious or inflammatory; and few scattered stable right sided pulmonary nodules.   Pain issues, if any: Denies Using a feeding tube?: Removed 08/15/24 Weight changes, if any: He is gradually recovering after losing a lot of weight during treatment. Wt Readings from Last 3 Encounters:  10/18/24 162 lb 3.2 oz (73.6 kg)  09/20/24 160 lb (72.6 kg)  04/13/24 153 lb 9.6 oz (69.7 kg)  Swallowing issues, if any:  Bothered by thick saliva. He is eating soft foods and  drinking Ensure plus for protein. Smoking or chewing tobacco? He quit smoking in 1986. He denies using chewing tobacco. Using fluoride  toothpaste daily? Denies Last ENT visit was on: 06/28/2024 with Dr. Lauralee. NED noted at that time.   BP 110/78 (BP Location: Left Arm, Patient Position: Sitting, Cuff Size: Large)   Pulse 88   Temp (!) 97.4 F (36.3 C)   Resp 20   Ht 6' 1 (1.854 m)   Wt 162 lb 3.2 oz (73.6 kg)   SpO2 100%   BMI 21.40 kg/m    ALLERGIES:  is allergic to ace inhibitors, atenolol, cardizem [diltiazem hcl], latex, melatonin, metoprolol , and clindamycin/lincomycin.  Meds: Current Outpatient Medications  Medication Sig Dispense Refill   acetaminophen  (TYLENOL ) 500 MG tablet Take 1,000 mg by mouth 2 (two) times daily as needed for mild pain or headache.     allopurinol  (ZYLOPRIM ) 300 MG tablet TAKE 1 TABLET BY MOUTH ONCE A DAY. 90 tablet 0   gabapentin  (NEURONTIN ) 100 MG capsule Take 100 mg by mouth 3 (three) times daily.     loratadine (CLARITIN) 10 MG tablet      pioglitazone  (ACTOS ) 15 MG tablet Take 15 mg by mouth daily.     rosuvastatin  (CRESTOR ) 20 MG tablet Take 20 mg by mouth daily.     sitaGLIPtin (JANUVIA) 100 MG tablet Take 100 mg by mouth daily.     sitaGLIPtin-metformin  (JANUMET) 50-1000 MG tablet Take 1 tablet by mouth daily.     torsemide  (DEMADEX ) 20 MG tablet Take 1-2 tablets (  20-40 mg total) by mouth daily. 90 tablet 1   warfarin (COUMADIN ) 5 MG tablet TAKE 1-2 TABLETS BY MOUTH ONCE DAILY OR AS DIRECTED 60 tablet 5   chlorhexidine  (HIBICLENS ) 4 % external liquid Apply topically daily as needed. (Patient not taking: Reported on 10/18/2024) 236 mL 0   cholecalciferol (VITAMIN D3) 25 MCG (1000 UNIT) tablet Take 1,000 Units by mouth daily. (Patient not taking: Reported on 10/18/2024)     hydrocortisone  cream 1 % Apply to affected area 2 times daily.  Placed over areas of redness where the bandages were but not directly over where the tube inserts. (Patient  not taking: Reported on 10/18/2024) 15 g 0   lidocaine  (XYLOCAINE ) 2 % solution Patient: Mix 1part 2% viscous lidocaine , 1part H20. Swish & swallow 10mL of diluted mixture, before meals and at bedtime, up to QID (Patient not taking: Reported on 10/18/2024) 200 mL 3   metFORMIN  (GLUCOPHAGE ) 1000 MG tablet Take 1,000 mg by mouth daily with breakfast. (Patient not taking: Reported on 09/20/2024)     nystatin  (MYCOSTATIN ) 100000 UNIT/ML suspension Take 5 mLs (500,000 Units total) by mouth 4 (four) times daily. Swish for 1 minute and then swallow. Continue for 12 days. (Patient not taking: Reported on 10/18/2024) 240 mL 0   omeprazole (PRILOSEC) 20 MG capsule Take 20 mg by mouth daily. (Patient not taking: Reported on 10/18/2024)     scopolamine  (TRANSDERM-SCOP) 1 MG/3DAYS Place 1 patch (1.5 mg total) onto the skin every 3 (three) days. (Patient not taking: Reported on 10/18/2024) 10 patch 2   SODIUM FLUORIDE  5000 PPM 1.1 % PSTE Take 1 Application by mouth at bedtime. (Patient not taking: Reported on 10/18/2024)     traMADol  (ULTRAM ) 50 MG tablet Take 1 tablet (50 mg total) by mouth every 6 (six) hours as needed. (Patient not taking: Reported on 10/18/2024) 30 tablet 0   No current facility-administered medications for this encounter.    Physical Findings:  The patient is in no acute distress. Patient is alert and oriented. Wt Readings from Last 3 Encounters:  10/18/24 162 lb 3.2 oz (73.6 kg)  09/20/24 160 lb (72.6 kg)  04/13/24 153 lb 9.6 oz (69.7 kg)    height is 6' 1 (1.854 m) and weight is 162 lb 3.2 oz (73.6 kg). His temperature is 97.4 F (36.3 C) (abnormal). His blood pressure is 110/78 and his pulse is 88. His respiration is 20 and oxygen saturation is 100%. .  General: Alert and oriented, in no acute distress HEENT: Head is normocephalic. Uvula midline. Extraocular movements are intact. Oropharynx is notable for tongue graft in place. Dryness in the oral cavity. Sub-centimeter nodule  in the right cheek that is smooth and symmetric in nature, not concerning for malignancy.  Neck: Neck is notable for no palpable submental, cervical, or supraclavicular lymphadenopathy.  No edema. Firmness to the anterior cervical region, consistent with treatment changes.  CHEST: Lungs clear to auscultation bilaterally. CARDIOVASCULAR: Heart regular rate and rhythm. ABDOMEN: Abdomen flat, non-distended Skin: Skin in treatment fields shows satisfactory healing in the radiation fields  Extremities: No cyanosis or edema. Lymphatics: see Neck Exam Psychiatric: Judgment and insight are intact. Affect is appropriate.   Lab Findings: Lab Results  Component Value Date   WBC 5.7 02/07/2024   HGB 15.6 02/07/2024   HCT 46.8 02/07/2024   MCV 91.6 02/07/2024   PLT 117 (L) 02/07/2024    Lab Results  Component Value Date   TSH 0.900 04/07/2023    Radiographic  Findings: CT Soft Tissue Neck W Contrast Result Date: 10/11/2024 CLINICAL DATA:  Squamous cell carcinoma of the tongue EXAM: CT NECK WITH CONTRAST TECHNIQUE: Multidetector CT imaging of the neck was performed using the standard protocol following the bolus administration of intravenous contrast. RADIATION DOSE REDUCTION: This exam was performed according to the departmental dose-optimization program which includes automated exposure control, adjustment of the mA and/or kV according to patient size and/or use of iterative reconstruction technique. CONTRAST:  75mL OMNIPAQUE  IOHEXOL  300 MG/ML  SOLN COMPARISON:  November 02, 2023 FINDINGS: Pharynx: The nasopharynx, oropharynx and hypopharynx are normal Oral cavity/floor of mouth: Normal Larynx: Normal Salivary glands: There has been surgical resection of the submandibular glands. The right parotid gland is atrophic. The left appears relatively normal. No abnormal mass Thyroid : Normal Lymph nodes: No adenopathy Vascular: No significant abnormality Limited intracranial: No significant abnormality  Visualized orbits: No significant abnormality Mastoids and visualized paranasal sinuses: No significant abnormality Skeleton: No significant abnormality Upper chest: No significant abnormality Other: None IMPRESSION: No recurrent mass or adenopathy Electronically Signed   By: Nancyann Burns M.D.   On: 10/11/2024 09:22   CT Chest W Contrast Result Date: 10/10/2024 CLINICAL DATA:  History of head neck cancer, monitor. * Tracking Code: BO * EXAM: CT CHEST WITH CONTRAST TECHNIQUE: Multidetector CT imaging of the chest was performed during intravenous contrast administration. RADIATION DOSE REDUCTION: This exam was performed according to the departmental dose-optimization program which includes automated exposure control, adjustment of the mA and/or kV according to patient size and/or use of iterative reconstruction technique. CONTRAST:  75mL OMNIPAQUE  IOHEXOL  300 MG/ML  SOLN COMPARISON:  Multiple priors including CT November 02, 2023 FINDINGS: Cardiovascular: Aortic atherosclerosis. Mitral valve prosthesis. Coronary artery calcifications. Normal size heart. No significant pericardial effusion/thickening. Mediastinum/Nodes: No suspicious thyroid  nodule. No pathologically enlarged mediastinal, hilar or axillary lymph nodes. Esophagus is grossly unremarkable. Lungs/Pleura: Biapical pleural-parenchymal scarring. New irregular 4 mm left lower lobe pulmonary nodule on image 115/7. A few scattered right-sided pulmonary nodules are stable from prior. For reference: -4 mm subpleural right upper lobe pulmonary nodule on image 96/7 -3 mm right upper lobe pulmonary nodule on image 102/7 Left lower lobe calcified granuloma. Scattered atelectasis/scarring. Upper Abdomen: No acute abnormality. Musculoskeletal: No aggressive lytic or blastic lesion of bone. Prior median sternotomy. Thoracic spondylosis. IMPRESSION: 1. New irregular 4 mm left lower lobe pulmonary nodule, favored to reflect an infectious or inflammatory etiology but  nonspecific. Consider attention on short-term interval follow-up chest CT 2. A few scattered right-sided pulmonary nodules are stable from prior. 3. No pathologically enlarged mediastinal or hilar lymph nodes. 4. Please refer to same day CT of the neck for findings above the thoracic inlet. Aortic Atherosclerosis (ICD10-I70.0). Electronically Signed   By: Reyes Holder M.D.   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 Patient states he is happy with how he is feeling overall.    2) Nutritional Status: patient is gaining weight.  Wt Readings from Last 3 Encounters:  10/18/24 162 lb 3.2 oz (73.6 kg)  09/20/24 160 lb (72.6 kg)  04/13/24 153 lb 9.6 oz (69.7 kg)  PEG - removed on 08/15/2024  3) Risk Factors: The patient has been educated about risk factors including alcohol and tobacco abuse; they understand that avoidance of alcohol and tobacco is  important to prevent recurrences as well as other cancers. Patient quit in 1986  4) Swallowing issues: Functional. Still difficult to swallow dry/tough foods. He is no longer seeing SLP and states that it was not helpful for him.   5) Dental: Encouraged to continue regular followup with dentistry, and dental hygiene including fluoride  supplements.   6) Thyroid  function: Recommend checking annually. We will obtain in 6 months. Last TSH in April of 2024 WNL.    7) Depressed mood/fatigue  - Patient has been started on Lexapro and reports feeling much better overall. He is pleased with his response to this.   8)Thick saliva    Encouraged patient to continue with baking soda/table salt rinse as needed.  - Patient was prescribed scopolamine , but never tried this due to concerns of possible side effects.   9)Next visit: Patient will see Dr. Lauralee in  December. We will f/u in 6 mo w/ CT neck and chest w/ contrast.  On date of service, in total, I spent 25 minutes on this encounter. Patient was seen in person.     Leeroy Due, PA-C

## 2024-10-19 DIAGNOSIS — K9423 Gastrostomy malfunction: Secondary | ICD-10-CM | POA: Diagnosis not present

## 2024-10-20 ENCOUNTER — Telehealth: Payer: Self-pay | Admitting: Cardiovascular Disease

## 2024-10-20 ENCOUNTER — Telehealth: Payer: Self-pay | Admitting: *Deleted

## 2024-10-20 NOTE — Telephone Encounter (Signed)
 Low risk for complications with that surgical procedure.  Okay to discontinue warfarin for 5 days before the procedure.

## 2024-10-20 NOTE — Telephone Encounter (Addendum)
 Called and spoke to pt's wife. Pt is to have surgery on 11/6 and was instructed by the surgeon to start holding warfarin on 11/1. Please see phone note from today (under Dr. Francyne) for further documentation.

## 2024-10-20 NOTE — Telephone Encounter (Signed)
   Patient Name: Jacob [name]  DOB: 12-17-1951 MRN: [id number]  Primary Cardiologist: Jerel Balding, MD  Chart reviewed as part of pre-operative protocol coverage. Given past medical history and time since last visit, based on ACC/AHA guidelines, [name] is at acceptable risk for the planned procedure without further cardiovascular testing.   The patient was advised that if he develops new symptoms prior to surgery to contact our office to arrange for a follow-up visit, and he verbalized understanding.  Per Dr. Balding, patient may discontinue warfarin for 5 days prior to the procedure.  Plan to resume it as soon as felt to be feasible from a surgical standpoint in the post-operative period.   I will route this recommendation to the requesting party via Epic fax function and remove from pre-op pool.  Please call with questions.  Rianna Lukes E Chyanne Kohut, NP 10/20/2024, 1:48 PM

## 2024-10-20 NOTE — Telephone Encounter (Signed)
 Our office has not received a formal clearance request from the surgeons office, and will need that prior to giving advice on Coumadin  hold.  Advise the wife to keep the scheduled appointment with the Coumadin  clinic, as we have had no formal discussion regarding this issue with the surgeon's office.  Inform her she may need to contact the surgeon performing the procedure and have their office send a formal request so that we can advise on Coumadin  hold, as it may not be safe for patient to stop Coumadin  without Cardiology input.

## 2024-10-20 NOTE — Telephone Encounter (Signed)
 I called Dr. Tinnie Sing office to request that a clearance.   I s/w Vonn at Dr. Sing office. I confirmed information needed for preop clearance verbally by phone.      Pre-operative Risk Assessment    Patient Name: Jacob [name]  DOB: [birth year] MRN: [id number]   Date of last office visit: 09/20/24 DR. CROITORU Date of next office visit: NONE   Request for Surgical Clearance    Procedure:  LAPAROSCOPIC TAKE DOWN OF GASTROCUTANEOUS FISTULA   Date of Surgery:  Clearance 10/27/24                                Surgeon:  DR. TINNIE Sing Surgeon's Group or Practice Name:  [facility] Phone number:  [phone] Fax number:  [phone]   Type of Clearance Requested:   - Medical  - Pharmacy:  Hold Warfarin (Coumadin )     Type of Anesthesia:  General    Additional requests/questions:    Jacob [name]   10/20/2024, 11:59 AM

## 2024-10-20 NOTE — Telephone Encounter (Signed)
 Wife Jethro) stated patient will be having surgery (to close a hole from his stomach tube) next week and will need to stop his Coumadin  on 11/1.  Wife wants to know if patient will need to have his INR checked prior to the surgery at [facility] or keep his appointment on 11/5.

## 2024-10-20 NOTE — Telephone Encounter (Addendum)
 Pt's wife called and stated that pt is to have a procedure on 11/6 and was instructed by surgeon to start holding warfarin on 11/1.   Wife wondering if pt still needs appointment next week to have INR checked because he would have been off warfarin. Rescheduled pt to come in on 11/02/2024 to have INR checked.   However, informed pt's wife that I do not see a clearance note to make sure pt can come off of warfarin safely.   Dr.  Tinnie Sing is pt's surgeon.   Pt is scheduled to have a Laparoscopic takedown of gastrocutaneous fistula on 11/6.

## 2024-10-20 NOTE — Telephone Encounter (Signed)
 Dr. Francyne, you recently saw this pt in clinic. Are you able to comment on surgical clearance for upcoming  LAPAROSCOPIC TAKE DOWN OF GASTROCUTANEOUS FISTULA  scheduled for 10/27/2024? Please route your response to P CV DIV PREOP. Thank you!  Also routing this to both pharmacy/MD for comment on Warfarin hold.

## 2024-10-21 DIAGNOSIS — F330 Major depressive disorder, recurrent, mild: Secondary | ICD-10-CM | POA: Diagnosis not present

## 2024-10-21 DIAGNOSIS — E782 Mixed hyperlipidemia: Secondary | ICD-10-CM | POA: Diagnosis not present

## 2024-10-21 DIAGNOSIS — E1165 Type 2 diabetes mellitus with hyperglycemia: Secondary | ICD-10-CM | POA: Diagnosis not present

## 2024-10-21 DIAGNOSIS — I10 Essential (primary) hypertension: Secondary | ICD-10-CM | POA: Diagnosis not present

## 2024-10-23 ENCOUNTER — Other Ambulatory Visit: Payer: Self-pay | Admitting: Cardiovascular Disease

## 2024-10-24 ENCOUNTER — Telehealth: Payer: Self-pay | Admitting: *Deleted

## 2024-10-24 NOTE — Telephone Encounter (Signed)
 Called patient to inform of Ct for 04-18-25- arrival time- 12:15 pm @ [facility], no restrictions to scan, patient to have lab and fu with PA Ronita Due- arrival time- 10:15 am for lab and fu for CT results @ 11 am on 04-25-25 spoke with patient and he is aware of these appts. and the instructions

## 2024-10-24 NOTE — Telephone Encounter (Signed)
 Refill request for warfarin:  Last INR was 3.7 on 10/12/24 Next INR due 10/26/24 LOV was 09/20/24  Refill approved.

## 2024-10-25 DIAGNOSIS — I10 Essential (primary) hypertension: Secondary | ICD-10-CM | POA: Diagnosis not present

## 2024-10-25 DIAGNOSIS — E559 Vitamin D deficiency, unspecified: Secondary | ICD-10-CM | POA: Diagnosis not present

## 2024-10-25 DIAGNOSIS — E1169 Type 2 diabetes mellitus with other specified complication: Secondary | ICD-10-CM | POA: Diagnosis not present

## 2024-10-25 DIAGNOSIS — M109 Gout, unspecified: Secondary | ICD-10-CM | POA: Diagnosis not present

## 2024-10-26 ENCOUNTER — Ambulatory Visit

## 2024-10-27 DIAGNOSIS — K316 Fistula of stomach and duodenum: Secondary | ICD-10-CM | POA: Diagnosis not present

## 2024-10-27 DIAGNOSIS — Z9889 Other specified postprocedural states: Secondary | ICD-10-CM | POA: Diagnosis not present

## 2024-10-27 DIAGNOSIS — I2510 Atherosclerotic heart disease of native coronary artery without angina pectoris: Secondary | ICD-10-CM | POA: Diagnosis not present

## 2024-10-27 DIAGNOSIS — Z87891 Personal history of nicotine dependence: Secondary | ICD-10-CM | POA: Diagnosis not present

## 2024-10-27 DIAGNOSIS — Z7984 Long term (current) use of oral hypoglycemic drugs: Secondary | ICD-10-CM | POA: Diagnosis not present

## 2024-10-27 DIAGNOSIS — I10 Essential (primary) hypertension: Secondary | ICD-10-CM | POA: Diagnosis not present

## 2024-10-27 DIAGNOSIS — E119 Type 2 diabetes mellitus without complications: Secondary | ICD-10-CM | POA: Diagnosis not present

## 2024-10-27 DIAGNOSIS — I482 Chronic atrial fibrillation, unspecified: Secondary | ICD-10-CM | POA: Diagnosis not present

## 2024-10-27 DIAGNOSIS — K9423 Gastrostomy malfunction: Secondary | ICD-10-CM | POA: Diagnosis not present

## 2024-10-31 ENCOUNTER — Encounter: Payer: Self-pay | Admitting: Cardiovascular Disease

## 2024-10-31 DIAGNOSIS — Z0000 Encounter for general adult medical examination without abnormal findings: Secondary | ICD-10-CM | POA: Diagnosis not present

## 2024-10-31 DIAGNOSIS — R911 Solitary pulmonary nodule: Secondary | ICD-10-CM | POA: Diagnosis not present

## 2024-10-31 DIAGNOSIS — C029 Malignant neoplasm of tongue, unspecified: Secondary | ICD-10-CM | POA: Diagnosis not present

## 2024-10-31 DIAGNOSIS — D6869 Other thrombophilia: Secondary | ICD-10-CM | POA: Diagnosis not present

## 2024-10-31 DIAGNOSIS — Z860100 Personal history of colon polyps, unspecified: Secondary | ICD-10-CM | POA: Diagnosis not present

## 2024-10-31 DIAGNOSIS — E1169 Type 2 diabetes mellitus with other specified complication: Secondary | ICD-10-CM | POA: Diagnosis not present

## 2024-10-31 DIAGNOSIS — Z9889 Other specified postprocedural states: Secondary | ICD-10-CM | POA: Diagnosis not present

## 2024-10-31 DIAGNOSIS — I10 Essential (primary) hypertension: Secondary | ICD-10-CM | POA: Diagnosis not present

## 2024-10-31 DIAGNOSIS — M109 Gout, unspecified: Secondary | ICD-10-CM | POA: Diagnosis not present

## 2024-10-31 DIAGNOSIS — E782 Mixed hyperlipidemia: Secondary | ICD-10-CM | POA: Diagnosis not present

## 2024-10-31 DIAGNOSIS — R600 Localized edema: Secondary | ICD-10-CM | POA: Diagnosis not present

## 2024-10-31 DIAGNOSIS — Z23 Encounter for immunization: Secondary | ICD-10-CM | POA: Diagnosis not present

## 2024-11-02 ENCOUNTER — Ambulatory Visit (HOSPITAL_COMMUNITY)
Admission: RE | Admit: 2024-11-02 | Discharge: 2024-11-02 | Disposition: A | Discharge status: Home / Self Care | Source: Ambulatory Visit | Attending: Internal Medicine | Admitting: Internal Medicine

## 2024-11-02 ENCOUNTER — Ambulatory Visit (INDEPENDENT_AMBULATORY_CARE_PROVIDER_SITE_OTHER): Admitting: *Deleted

## 2024-11-02 DIAGNOSIS — Z5181 Encounter for therapeutic drug level monitoring: Secondary | ICD-10-CM

## 2024-11-02 DIAGNOSIS — I4891 Unspecified atrial fibrillation: Secondary | ICD-10-CM | POA: Diagnosis not present

## 2024-11-02 DIAGNOSIS — Z9889 Other specified postprocedural states: Secondary | ICD-10-CM | POA: Diagnosis not present

## 2024-11-02 DIAGNOSIS — I340 Nonrheumatic mitral (valve) insufficiency: Secondary | ICD-10-CM

## 2024-11-02 LAB — ECHOCARDIOGRAM COMPLETE
Area-P 1/2: 1.76 cm2
MV VTI: 1.39 cm2
S' Lateral: 2.45 cm

## 2024-11-02 LAB — POCT INR: INR: 1.2 — AB (ref 2.0–3.0)

## 2024-11-02 NOTE — Progress Notes (Signed)
 INR 1.2; Please see anticoagulation encounter

## 2024-11-02 NOTE — Patient Instructions (Signed)
 Take warfarin 2 tablets x 3 days then resume 1 tablet daily except 2 tablets on Mondays, Wednesdays and Fridays S/P oral surgery on tongue 5/24. On 6 cans on Ensure Plus daily Recheck in 1 week

## 2024-11-03 ENCOUNTER — Ambulatory Visit: Payer: Self-pay | Admitting: Cardiovascular Disease

## 2024-11-04 DIAGNOSIS — I10 Essential (primary) hypertension: Secondary | ICD-10-CM | POA: Diagnosis not present

## 2024-11-04 DIAGNOSIS — R413 Other amnesia: Secondary | ICD-10-CM | POA: Diagnosis not present

## 2024-11-04 DIAGNOSIS — F330 Major depressive disorder, recurrent, mild: Secondary | ICD-10-CM | POA: Diagnosis not present

## 2024-11-04 DIAGNOSIS — R634 Abnormal weight loss: Secondary | ICD-10-CM | POA: Diagnosis not present

## 2024-11-04 DIAGNOSIS — E559 Vitamin D deficiency, unspecified: Secondary | ICD-10-CM | POA: Diagnosis not present

## 2024-11-04 DIAGNOSIS — G479 Sleep disorder, unspecified: Secondary | ICD-10-CM | POA: Diagnosis not present

## 2024-11-04 DIAGNOSIS — R911 Solitary pulmonary nodule: Secondary | ICD-10-CM | POA: Diagnosis not present

## 2024-11-04 DIAGNOSIS — C029 Malignant neoplasm of tongue, unspecified: Secondary | ICD-10-CM | POA: Diagnosis not present

## 2024-11-04 DIAGNOSIS — I484 Atypical atrial flutter: Secondary | ICD-10-CM | POA: Diagnosis not present

## 2024-11-04 DIAGNOSIS — E782 Mixed hyperlipidemia: Secondary | ICD-10-CM | POA: Diagnosis not present

## 2024-11-04 DIAGNOSIS — E1165 Type 2 diabetes mellitus with hyperglycemia: Secondary | ICD-10-CM | POA: Diagnosis not present

## 2024-11-04 DIAGNOSIS — M109 Gout, unspecified: Secondary | ICD-10-CM | POA: Diagnosis not present

## 2024-11-08 ENCOUNTER — Ambulatory Visit: Attending: Cardiovascular Disease | Admitting: *Deleted

## 2024-11-08 DIAGNOSIS — I4891 Unspecified atrial fibrillation: Secondary | ICD-10-CM | POA: Diagnosis not present

## 2024-11-08 DIAGNOSIS — Z5181 Encounter for therapeutic drug level monitoring: Secondary | ICD-10-CM | POA: Diagnosis not present

## 2024-11-08 LAB — POCT INR: INR: 1.8 — AB (ref 2.0–3.0)

## 2024-11-08 NOTE — Progress Notes (Signed)
 INR 1.8. Please see anticoagulation encounter

## 2024-11-08 NOTE — Patient Instructions (Signed)
 Take warfarin 2 tablets tonight then increase dose to 2 tablets daily except 1 tablet on Tuesdays, Thursdays and Saturdays S/P oral surgery on tongue 5/24. On 6 cans on Ensure Plus daily Recheck in 2 weeks

## 2024-11-20 DIAGNOSIS — E1165 Type 2 diabetes mellitus with hyperglycemia: Secondary | ICD-10-CM | POA: Diagnosis not present

## 2024-11-20 DIAGNOSIS — F330 Major depressive disorder, recurrent, mild: Secondary | ICD-10-CM | POA: Diagnosis not present

## 2024-11-20 DIAGNOSIS — E782 Mixed hyperlipidemia: Secondary | ICD-10-CM | POA: Diagnosis not present

## 2024-11-20 DIAGNOSIS — I10 Essential (primary) hypertension: Secondary | ICD-10-CM | POA: Diagnosis not present

## 2024-11-22 DIAGNOSIS — K9423 Gastrostomy malfunction: Secondary | ICD-10-CM | POA: Diagnosis not present

## 2024-11-22 DIAGNOSIS — K316 Fistula of stomach and duodenum: Secondary | ICD-10-CM | POA: Diagnosis not present

## 2024-11-23 ENCOUNTER — Ambulatory Visit: Attending: Cardiovascular Disease | Admitting: *Deleted

## 2024-11-23 DIAGNOSIS — I4891 Unspecified atrial fibrillation: Secondary | ICD-10-CM

## 2024-11-23 DIAGNOSIS — Z5181 Encounter for therapeutic drug level monitoring: Secondary | ICD-10-CM | POA: Diagnosis not present

## 2024-11-23 LAB — POCT INR: INR: 3.2 — AB (ref 2.0–3.0)

## 2024-11-23 NOTE — Patient Instructions (Signed)
 Take warfarin 1 tablet tonight then resume 2 tablets daily except 1 tablet on Tuesdays, Thursdays and Saturdays S/P oral surgery on tongue 5/24. On 6 cans on Ensure Plus daily Recheck in 2 weeks

## 2024-11-23 NOTE — Progress Notes (Signed)
 INR 3.2 Please see anticoagulation encounter

## 2024-11-30 DIAGNOSIS — E782 Mixed hyperlipidemia: Secondary | ICD-10-CM | POA: Diagnosis not present

## 2024-11-30 DIAGNOSIS — M109 Gout, unspecified: Secondary | ICD-10-CM | POA: Diagnosis not present

## 2024-11-30 DIAGNOSIS — I484 Atypical atrial flutter: Secondary | ICD-10-CM | POA: Diagnosis not present

## 2024-11-30 DIAGNOSIS — R911 Solitary pulmonary nodule: Secondary | ICD-10-CM | POA: Diagnosis not present

## 2024-11-30 DIAGNOSIS — E559 Vitamin D deficiency, unspecified: Secondary | ICD-10-CM | POA: Diagnosis not present

## 2024-11-30 DIAGNOSIS — F330 Major depressive disorder, recurrent, mild: Secondary | ICD-10-CM | POA: Diagnosis not present

## 2024-11-30 DIAGNOSIS — R634 Abnormal weight loss: Secondary | ICD-10-CM | POA: Diagnosis not present

## 2024-11-30 DIAGNOSIS — C029 Malignant neoplasm of tongue, unspecified: Secondary | ICD-10-CM | POA: Diagnosis not present

## 2024-11-30 DIAGNOSIS — R413 Other amnesia: Secondary | ICD-10-CM | POA: Diagnosis not present

## 2024-11-30 DIAGNOSIS — G479 Sleep disorder, unspecified: Secondary | ICD-10-CM | POA: Diagnosis not present

## 2024-11-30 DIAGNOSIS — E1165 Type 2 diabetes mellitus with hyperglycemia: Secondary | ICD-10-CM | POA: Diagnosis not present

## 2024-11-30 DIAGNOSIS — I10 Essential (primary) hypertension: Secondary | ICD-10-CM | POA: Diagnosis not present

## 2024-12-07 ENCOUNTER — Ambulatory Visit: Attending: Cardiovascular Disease

## 2024-12-07 DIAGNOSIS — Z5181 Encounter for therapeutic drug level monitoring: Secondary | ICD-10-CM

## 2024-12-07 DIAGNOSIS — I4891 Unspecified atrial fibrillation: Secondary | ICD-10-CM | POA: Diagnosis not present

## 2024-12-07 LAB — POCT INR: INR: 3.3 — AB (ref 2.0–3.0)

## 2024-12-07 NOTE — Patient Instructions (Signed)
 Take warfarin 1 tablet tonight then decrease dose to 1 tablet daily except 2 tablets on Mondays, Wednesdays and Fridays S/P oral surgery on tongue 5/24. On 6 cans on Ensure Plus daily Recheck in 3 weeks

## 2024-12-07 NOTE — Progress Notes (Signed)
 INR 3.3; Please see anticoagulation encounter

## 2024-12-26 ENCOUNTER — Encounter: Payer: Self-pay | Admitting: Cardiovascular Disease

## 2024-12-27 ENCOUNTER — Ambulatory Visit: Attending: Cardiovascular Disease | Admitting: *Deleted

## 2024-12-27 DIAGNOSIS — I4891 Unspecified atrial fibrillation: Secondary | ICD-10-CM | POA: Diagnosis not present

## 2024-12-27 DIAGNOSIS — Z5181 Encounter for therapeutic drug level monitoring: Secondary | ICD-10-CM

## 2024-12-27 LAB — POCT INR: INR: 1.3 — AB (ref 2.0–3.0)

## 2024-12-27 NOTE — Progress Notes (Signed)
 INR 1.3 Please see anticoagulation encounter

## 2024-12-27 NOTE — Patient Instructions (Signed)
 Take warfarin 2 tablets tonight then increase dose back to 2 tablets daily except 1 tablet on Mondays, Wednesdays and Fridays S/P oral surgery on tongue 5/24. On 6 cans on Ensure Plus daily Recheck in 2 weeks

## 2025-01-10 ENCOUNTER — Ambulatory Visit

## 2025-01-23 ENCOUNTER — Ambulatory Visit

## 2025-01-24 ENCOUNTER — Ambulatory Visit

## 2025-01-31 ENCOUNTER — Ambulatory Visit

## 2025-04-18 ENCOUNTER — Other Ambulatory Visit (HOSPITAL_COMMUNITY)

## 2025-04-25 ENCOUNTER — Ambulatory Visit: Admitting: Radiology

## 2025-04-25 ENCOUNTER — Ambulatory Visit
